# Patient Record
Sex: Male | Born: 1935
Health system: Southern US, Community
[De-identification: ages and names within clinical notes are randomized; demographics above are authoritative.]

## PROBLEM LIST (undated history)

## (undated) DIAGNOSIS — I10 Essential (primary) hypertension: Secondary | ICD-10-CM

## (undated) DIAGNOSIS — I2510 Atherosclerotic heart disease of native coronary artery without angina pectoris: Secondary | ICD-10-CM

## (undated) DIAGNOSIS — E785 Hyperlipidemia, unspecified: Secondary | ICD-10-CM

## (undated) DIAGNOSIS — N400 Enlarged prostate without lower urinary tract symptoms: Secondary | ICD-10-CM

## (undated) DIAGNOSIS — J8410 Pulmonary fibrosis, unspecified: Secondary | ICD-10-CM

## (undated) DIAGNOSIS — R0602 Shortness of breath: Secondary | ICD-10-CM

## (undated) DIAGNOSIS — I998 Other disorder of circulatory system: Secondary | ICD-10-CM

## (undated) DIAGNOSIS — L989 Disorder of the skin and subcutaneous tissue, unspecified: Secondary | ICD-10-CM

## (undated) DIAGNOSIS — M1990 Unspecified osteoarthritis, unspecified site: Secondary | ICD-10-CM

## (undated) DIAGNOSIS — K219 Gastro-esophageal reflux disease without esophagitis: Secondary | ICD-10-CM

## (undated) HISTORY — PX: TONSILLECTOMY: SUR1361

## (undated) HISTORY — DX: Essential (primary) hypertension: I10

## (undated) HISTORY — DX: Hyperlipidemia, unspecified: E78.5

## (undated) HISTORY — PX: KNEE ARTHROSCOPY: SUR90

## (undated) HISTORY — DX: Gastro-esophageal reflux disease without esophagitis: K21.9

## (undated) HISTORY — PX: EYE SURGERY: SHX253

## (undated) HISTORY — PX: INGUINAL HERNIA REPAIR: SUR1180

## (undated) HISTORY — DX: Shortness of breath: R06.02

## (undated) HISTORY — DX: Atherosclerotic heart disease of native coronary artery without angina pectoris: I25.10

## (undated) HISTORY — DX: Unspecified osteoarthritis, unspecified site: M19.90

## (undated) HISTORY — DX: Benign prostatic hyperplasia without lower urinary tract symptoms: N40.0

## (undated) HISTORY — DX: Pulmonary fibrosis, unspecified: J84.10

## (undated) HISTORY — PX: JOINT REPLACEMENT: SHX530

## (undated) HISTORY — DX: Other disorder of circulatory system: I99.8

---
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
## 1997-12-28 ENCOUNTER — Ambulatory Visit (HOSPITAL_COMMUNITY): Admission: RE | Admit: 1997-12-28 | Discharge: 1997-12-28 | Payer: Self-pay | Admitting: Gastroenterology

## 2001-03-31 ENCOUNTER — Encounter (INDEPENDENT_AMBULATORY_CARE_PROVIDER_SITE_OTHER): Payer: Self-pay

## 2001-03-31 ENCOUNTER — Ambulatory Visit (HOSPITAL_COMMUNITY): Admission: RE | Admit: 2001-03-31 | Discharge: 2001-03-31 | Payer: Self-pay | Admitting: Gastroenterology

## 2001-07-25 ENCOUNTER — Encounter: Payer: Self-pay | Admitting: Occupational Medicine

## 2001-07-25 ENCOUNTER — Encounter: Admission: RE | Admit: 2001-07-25 | Discharge: 2001-07-25 | Payer: Self-pay | Admitting: Occupational Medicine

## 2004-12-18 ENCOUNTER — Encounter: Admission: RE | Admit: 2004-12-18 | Discharge: 2004-12-18 | Payer: Self-pay | Admitting: Orthopedic Surgery

## 2007-06-02 ENCOUNTER — Inpatient Hospital Stay (HOSPITAL_BASED_OUTPATIENT_CLINIC_OR_DEPARTMENT_OTHER): Admission: RE | Admit: 2007-06-02 | Discharge: 2007-06-02 | Payer: Self-pay | Admitting: Cardiovascular Disease

## 2007-06-09 ENCOUNTER — Inpatient Hospital Stay (HOSPITAL_COMMUNITY): Admission: AD | Admit: 2007-06-09 | Discharge: 2007-06-10 | Payer: Self-pay | Admitting: Cardiovascular Disease

## 2007-06-09 HISTORY — PX: CORONARY ANGIOPLASTY WITH STENT PLACEMENT: SHX49

## 2007-06-14 ENCOUNTER — Emergency Department (HOSPITAL_COMMUNITY): Admission: EM | Admit: 2007-06-14 | Discharge: 2007-06-14 | Payer: Self-pay | Admitting: Emergency Medicine

## 2007-08-21 ENCOUNTER — Encounter (HOSPITAL_COMMUNITY): Admission: RE | Admit: 2007-08-21 | Discharge: 2007-11-19 | Payer: Self-pay | Admitting: Cardiovascular Disease

## 2007-11-20 ENCOUNTER — Encounter (HOSPITAL_COMMUNITY): Admission: RE | Admit: 2007-11-20 | Discharge: 2007-11-21 | Payer: Self-pay | Admitting: Cardiovascular Disease

## 2009-08-09 ENCOUNTER — Inpatient Hospital Stay (HOSPITAL_COMMUNITY): Admission: EM | Admit: 2009-08-09 | Discharge: 2009-08-10 | Payer: Self-pay | Admitting: Emergency Medicine

## 2010-05-11 ENCOUNTER — Ambulatory Visit: Payer: Self-pay | Admitting: Cardiovascular Disease

## 2010-09-28 LAB — URINALYSIS, ROUTINE W REFLEX MICROSCOPIC
Bilirubin Urine: NEGATIVE
Glucose, UA: NEGATIVE mg/dL
Ketones, ur: NEGATIVE mg/dL
Leukocytes, UA: NEGATIVE
Nitrite: NEGATIVE
Protein, ur: 30 mg/dL — AB
Specific Gravity, Urine: 1.02 (ref 1.005–1.030)
Urobilinogen, UA: 0.2 mg/dL (ref 0.0–1.0)
pH: 5.5 (ref 5.0–8.0)

## 2010-09-28 LAB — COMPREHENSIVE METABOLIC PANEL
ALT: 26 U/L (ref 0–53)
Albumin: 3.9 g/dL (ref 3.5–5.2)
Alkaline Phosphatase: 61 U/L (ref 39–117)
BUN: 18 mg/dL (ref 6–23)
BUN: 24 mg/dL — ABNORMAL HIGH (ref 6–23)
CO2: 23 mEq/L (ref 19–32)
Calcium: 8.4 mg/dL (ref 8.4–10.5)
Calcium: 8.9 mg/dL (ref 8.4–10.5)
Chloride: 109 mEq/L (ref 96–112)
Creatinine, Ser: 1.39 mg/dL (ref 0.4–1.5)
Creatinine, Ser: 1.4 mg/dL (ref 0.4–1.5)
GFR calc non Af Amer: 49 mL/min — ABNORMAL LOW (ref >60)
GFR calc non Af Amer: 49 mL/min — ABNORMAL LOW (ref >60)
Glucose, Bld: 100 mg/dL — ABNORMAL HIGH (ref 70–99)
Total Bilirubin: 0.7 mg/dL (ref 0.3–1.2)

## 2010-09-28 LAB — COMPREHENSIVE METABOLIC PANEL WITH GFR
AST: 56 U/L — ABNORMAL HIGH (ref 0–37)
CO2: 22 meq/L (ref 19–32)
Chloride: 112 meq/L (ref 96–112)
GFR calc Af Amer: 59 mL/min — ABNORMAL LOW (ref >60)
Glucose, Bld: 144 mg/dL — ABNORMAL HIGH (ref 70–99)
Potassium: 5.6 meq/L — ABNORMAL HIGH (ref 3.5–5.1)
Sodium: 138 meq/L (ref 135–145)
Total Bilirubin: 1.7 mg/dL — ABNORMAL HIGH (ref 0.3–1.2)
Total Protein: 7.1 g/dL (ref 6.0–8.3)

## 2010-09-28 LAB — CARDIAC PANEL(CRET KIN+CKTOT+MB+TROPI)
CK, MB: 4.9 ng/mL — ABNORMAL HIGH (ref 0.3–4.0)
Relative Index: 0.9 (ref 0.0–2.5)

## 2010-09-28 LAB — LIPASE, BLOOD: Lipase: 32 U/L (ref 11–59)

## 2010-09-28 LAB — DIFFERENTIAL
Basophils Absolute: 0 10*3/uL (ref 0.0–0.1)
Basophils Relative: 0 % (ref 0–1)
Eosinophils Absolute: 0 10*3/uL (ref 0.0–0.7)
Eosinophils Relative: 0 % (ref 0–5)
Lymphocytes Relative: 2 % — ABNORMAL LOW (ref 12–46)
Lymphs Abs: 0.2 K/uL — ABNORMAL LOW (ref 0.7–4.0)
Monocytes Absolute: 0.5 10*3/uL (ref 0.1–1.0)
Monocytes Relative: 6 % (ref 3–12)
Neutro Abs: 7.5 K/uL (ref 1.7–7.7)
Neutrophils Relative %: 92 % — ABNORMAL HIGH (ref 43–77)

## 2010-09-28 LAB — CULTURE, BLOOD (ROUTINE X 2)
Culture: NO GROWTH
Culture: NO GROWTH

## 2010-09-28 LAB — D-DIMER, QUANTITATIVE: D-Dimer, Quant: 2.34 ug{FEU}/mL — ABNORMAL HIGH (ref 0.00–0.48)

## 2010-09-28 LAB — CBC
HCT: 40.1 % (ref 39.0–52.0)
HCT: 44.3 % (ref 39.0–52.0)
Hemoglobin: 13.6 g/dL (ref 13.0–17.0)
Hemoglobin: 15.1 g/dL (ref 13.0–17.0)
MCHC: 34 g/dL (ref 30.0–36.0)
MCV: 86.9 fL (ref 78.0–100.0)
Platelets: 121 10*3/uL — ABNORMAL LOW (ref 150–400)
Platelets: 88 10*3/uL — ABNORMAL LOW (ref 150–400)
RBC: 4.61 MIL/uL (ref 4.22–5.81)
RBC: 5.1 MIL/uL (ref 4.22–5.81)
RDW: 13.5 % (ref 11.5–15.5)
WBC: 8.2 K/uL (ref 4.0–10.5)

## 2010-09-28 LAB — URINE MICROSCOPIC-ADD ON

## 2010-09-28 LAB — POCT CARDIAC MARKERS
CKMB, poc: 4.3 ng/mL (ref 1.0–8.0)
Myoglobin, poc: 428 ng/mL (ref 12–200)
Troponin i, poc: <0.05 ng/mL (ref 0.00–0.09)

## 2010-09-28 LAB — HEMOCCULT GUIAC POC 1CARD (OFFICE): Fecal Occult Bld: NEGATIVE

## 2010-11-21 NOTE — Cardiovascular Report (Signed)
[name], AGE           ACCOUNT NO.:  [id number]   MEDICAL RECORD NO.:  [id number]          PATIENT TYPE:  OIB   LOCATION:  2807                         FACILITY:  [facility]   PHYSICIAN:  Vesta Mixer, M.D. DATE OF BIRTH:  [birth year]   DATE OF PROCEDURE:  06/09/2007  DATE OF DISCHARGE:                            CARDIAC CATHETERIZATION   INDICATIONS:  Patrick Knapp is a 77 year old gentleman with a recent  development of shortness of breath with exertion.  He had a stress  Cardiolite study which revealed lateral ischemia.  He had a heart  catheterization which revealed a very tight stenosis in the first obtuse  marginal artery.  He had moderate disease in the left anterior  descending artery but not look bad enough to warrant angioplasty.  He  was referred for PTCA and stenting of the first obtuse marginal artery.   PROCEDURE:  The procedure was left heart catheterization with coronary  angiography with PTCA and stenting of the first obtuse marginal.   PROCEDURE IN DETAIL:  The right femoral artery was easily cannulated  using modified Seldinger technique.  We placed a 6-French sheath.  The  patient was given Angiomax.  The ACT was 397.  The BMW wire was  positioned up to the circumflex and placed down the distal obtuse  marginal artery number one.  A 2.0 x 12-mm Maverick was positioned  across the stenosis and was inflated up to 6 atmospheres for 41 seconds.   Following this, a 2.5 x 60-mm Taxus stent was positioned up in the  obtuse marginal artery.  We used a Advertising account planner as a buddy wire to help  position.  This was mostly required because of the tortuosity of the  first obtuse marginal artery.  The stent was deployed at 14 atmospheres  for 47 seconds.  We then inflated the stent one additional time up to 8  atmospheres to make sure that it had released from the stent.   This balloon was removed and post stent dilatation was achieved using a  2.5 x 15-mm Quantum  Maverick.  It was centered and the middle of the  stent was inflated up to 14 atmospheres for 33 seconds.  This resulted  in a very nice angiographic result.  There was a slight step up on the  proximal edge of the stent, but it certainly did not look like an edge  dissection.  There were no complications.   COMPLICATIONS:  None.   CONCLUSIONS:  Successful PTCA and stenting of the mid first obtuse  marginal artery.  The patient still has moderate stenosis of the LAD.  We will continue to treat that medically.  If its gets much more severe,  then we will consider a PTCA and stenting of his proximal/mid LAD.           ______________________________  Vesta Mixer, M.D.     PJN/MEDQ  D:  06/09/2007  T:  06/09/2007  Job:  [id number]   cc:   Larina Earthly, M.D.

## 2010-11-21 NOTE — H&P (Signed)
NAMEJONATHYN, Patrick Knapp           ACCOUNT NO.:  [id number]   MEDICAL RECORD NO.:  [id number]           PATIENT TYPE:   LOCATION:                                 FACILITY:   PHYSICIAN:  Vesta Mixer, M.D. DATE OF BIRTH:  Apr 15, 1928   DATE OF ADMISSION:  05/30/2007  DATE OF DISCHARGE:                              HISTORY & PHYSICAL   HISTORY OF PRESENT ILLNESS:  Patrick Knapp is a 77 year old  gentleman with a history of shortness of breath with exertion.  He is  admitted for heart catheterization after having an abnormal stress test.   Patrick Knapp is a relatively healthy gentleman.  He has a history of an  enlarged prostate.  He also has a history of a spinal cyst and some eye  surgery years ago.  He recently has started having some episodes of  shortness of breath with any sort of exertion.  He noted that he had  shortness breath with raking leaves or doing other yard work.  He has  also noted shortness of breath when he climbs stairs.  He has had some  trouble sleeping, but denies any sort of orthopnea.   He was recently referred for a stress Cardiolite study.  He was found to  have lateral wall ischemia with overall fairly well preserved left  ventricular systolic function.  He is now referred for heart  catheterization.   CURRENT MEDICATIONS:  1. Doxazosin 8 mg a day.  2. Aspirin 81 mg two times a day.  3. Multivitamin once a day.   ALLERGIES:  None.   PAST MEDICAL HISTORY:  1. History of prostatic enlargement.  2. History of spinal cyst  3. 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  He does not smoke or drink alcohol.   FAMILY HISTORY:  Father died at age 71 due to lung cancer.  There was a  question of coronary artery disease in his father.  His mother died at  age 57 due to problems related to congestive heart failure and TIA.  He  has a brother who had coronary artery  bypass grafting.   REVIEW OF SYSTEMS:  Review is essentially negative except as noted in  the HPI.   PHYSICAL EXAMINATION:  GENERAL:  He is an elderly gentleman in no acute  distress.  He is alert and oriented x3 and his mood and affect are  normal.  VITAL SIGNS:  Weight is 208, blood pressure 130/76 with heart rate of  72.  HEENT:  Exam reveals 2+ carotids, no bruits, no JVD, no thyromegaly.  LUNGS: Clear to auscultation.  Heart: Regular rate, S1-S2.  ABDOMEN:  Reveals good bowel sounds and is nontender.  EXTREMITIES:  Has no clubbing, cyanosis or edema.  NEUROLOGICAL:  Exam is nonfocal   STUDIES:  Stress Cardiolite study reveals evidence of ischemia in the  lateral wall.  He has well preserved left ventricular systolic function  with an ejection fraction of 64%.  His overall contractility is normal.  His lab work reveals a glucose level of 88.  His sodium is 141,  potassium is 4.2.  His white blood cell count is 6.5, hemoglobin is  13.2, hematocrit 40.4.  His protime and PTT are pending.   Patrick Knapp presents with recent episodes of dyspnea on exertion.  He  has an abnormal stress Cardiolite study suggestive of lateral ischemia.  I have had a long discussion with Patrick Knapp and his wife.  We have  discussed the risks, benefits and options concerning heart  catheterization.  He understands and agrees to proceed.  We will  schedule this for Monday.  All of his other medical problems remain  fairly stable.           ______________________________  Vesta Mixer, M.D.     PJN/MEDQ  D:  05/30/2007  T:  05/31/2007  Job:  [id number]   cc:   Larina Earthly, M.D.

## 2010-11-21 NOTE — Cardiovascular Report (Signed)
Patrick Knapp, GARRELS NO.:  [id number]   MEDICAL RECORD NO.:  [id number]          PATIENT TYPE:  OIB   LOCATION:  1962                         FACILITY:  [facility]   PHYSICIAN:  Vesta Mixer, M.D. DATE OF BIRTH:  23-Jun-1928   DATE OF PROCEDURE:  06/02/2007  DATE OF DISCHARGE:                            CARDIAC CATHETERIZATION   Sosaia is a 77 year old gentleman with a recent onset of shortness of  breath with exertion.  He recently had a stress Cardiolite study which  revealed evidence of lateral ischemia. He is referred for heart  catheterization for further evaluation.   PROCEDURE:  Left heart catheterization with coronary angiography.   The right femoral artery was easily cannulated using the modified  Seldinger technique.   HEMODYNAMIC RESULTS:  LV pressure is 129/8 with an aortic pressure of  133/63.   Angiography left main.  The left main is fairly smooth and normal.   We used a Judkins left 5 catheter to cannulate the left main.   The left anterior descending artery has proximal irregularities.  The  mid segment has a long 30-40% diffuse stenosis.  The distal LAD has  diffuse irregularities but no critical stenosis.   The first diagonal artery is a small to moderate size vessel.  It is  perhaps 1.5 mm in diameter.  There is a 50-60% stenosis in the proximal  aspect followed by a sequential 50-60% stenosis in the mid aspect.  The  second diagonal artery is a very tiny vessel with a 50% stenosis at its  origin.   The left circumflex artery is a moderate-sized vessel.  It gives off a  moderate-sized first obtuse marginal artery.  There is a focal 95%  stenosis in the first obtuse marginal artery.  The distal circumflex  artery is a relatively small branch and is otherwise unremarkable.   The right coronary artery is a moderate-sized vessel and is dominant.  There are minor luminal irregularities. The posterior descending artery  has minor luminal  irregularities.  The posterior lateral segment artery  are unremarkable.   The left ventriculogram was performed in a 30 RAO position.  It reveals  overall normal left ventricular systolic function.  The ejection  fraction is around 60%.   COMPLICATIONS:  None.   CONCLUSION:  Moderate coronary artery disease involving the left  anterior descending artery with a tight stenosis in the obtuse marginal  artery.  At this point, I do not think that the LAD stenosis is  obstructive.  We will anticipate PTCA and stenting of the obtuse  marginal stenosis.  We will go ahead and start loading him on Plavix  today. His left ventricular systolic function is normal.           ______________________________  Vesta Mixer, M.D.     PJN/MEDQ  D:  06/02/2007  T:  06/02/2007  Job:  [id number]   cc:   Larina Earthly, M.D.

## 2010-11-21 NOTE — Discharge Summary (Signed)
[name], [name]           ACCOUNT NO.:  [id number]   MEDICAL RECORD NO.:  [id number]          PATIENT TYPE:  INP   LOCATION:  6522                         FACILITY:  [facility]   PHYSICIAN:  Vesta Mixer, M.D. DATE OF BIRTH:  Oct 03, 1927   DATE OF ADMISSION:  06/09/2007  DATE OF DISCHARGE:  06/10/2007                               DISCHARGE SUMMARY   DISCHARGE DIAGNOSES:  1. Coronary artery disease - status post PTCA and stenting of his left      circumflex first marginal vessel.  2. History of benign prostatic hypertrophy.   DISCHARGE MEDICATIONS:  1. Aspirin 81 mg a day.  2. Plavix 75 mg a day.  3. Nitroglycerin 0.4 mg sublingually as needed.  4. Doxazosin 8 mg at night.  5. Crestor 10 mg at night.   DISPOSITION:  The patient will return to see Dr. Elease Hashimoto in 1-2 weeks.  Will get a fasting lipid profile with Lipomed profile.  We will also get  a high sensitivity CRP.  The patient will call sooner if he has any  problems.   HISTORY OF PRESENT ILLNESS:  Mr. Patrick Knapp is a 77 year old gentleman  with a recent diagnosis of coronary artery disease.  Please see dictated  H&P for further details.   HOSPITAL COURSE PER PROBLEMS:  Coronary artery disease.  The patient had  a diagnostic catheterization last week which revealed a tight first  obtuse marginal artery.  He underwent successful PTCA and stenting of  the first obtuse marginal vessel using a 2.25 x 16 mm Taxus stent.  It  was deployed at 14 atmospheres.  Was postdilated using a 2.5 x 15 mm  Quantum Maverick inflated up to 14 atmospheres.  We obtained a very nice  result with 0% residual stenosis.   He does have a 40-50% mid LAD stenosis which we will treat medically.  He does not have any ischemia in this area by Cardiolite scanning last  week.  We will try him on high dose lipid lowering therapy.  We will get  a Lipomed profile to evaluate his Lp(a).  Will also get a high  sensitivity CRP.  He will likely need  aggressive cholesterol lowering.  All his other medical problems remained stable.           ______________________________  Vesta Mixer, M.D.     PJN/MEDQ  D:  06/10/2007  T:  06/10/2007  Job:  [id number]   cc:   Larina Earthly, M.D.

## 2010-11-24 NOTE — Procedures (Signed)
[facility]  Patient:    Patrick Knapp, Patrick Knapp Visit Number: [id number] MRN: [id number]          Service Type: END Location: ENDO Attending Physician:  Louie Bun Dictated by:   Everardo All Madilyn Fireman, M.D. Admit Date:  03/31/2001   CC:         Ravi R. Felipa Eth, M.D.   Procedure Report  PROCEDURE:  Colonoscopy and polypectomy.  ENDOSCOPIST:  Everardo All. Madilyn Fireman, M.D.  INDICATIONS FOR PROCEDURE:  Adenomatous colon polyps on initial colonoscopy three years ago.  PROCEDURE:  The patient was placed in the left lateral decubitus position and placed on the pulse monitor with continuous low flow oxygen delivered via nasal cannula.  She was sedated with 70 mg of IV Demerol and 6 mg of IV Versed.  The Olympus video colonoscope was inserted into the rectum and advanced to the cecum and confirmed by transillumination at McBurneys point and visualization of the ileocecal valve and appendiceal orifice.  The cecum and ascending colon appeared normal.  There was an 8 mm polyp in the transverse colon which was fulgurated by hot biopsy.  The remainder of the transverse colon appeared normal.  The descending and sigmoid colon appeared normal.  Within the rectum, there was an 8 mm polyp that was fulgurated by hot biopsy and sent in the same jar as the other polyp.  The colonoscope was withdrawn and the patient returned to the recovery room in stable condition. She tolerated the procedure well and there were no immediate complications.  IMPRESSION:  Two small colon polyps, otherwise, normal colonoscopy.  PLAN:  Await biopsy results to determine interval for next colonoscopy. Dictated by:   Everardo All Madilyn Fireman, M.D. Attending Physician:  Louie Bun DD:  03/31/01 TD:  03/31/01 Job: [id number]

## 2010-12-08 ENCOUNTER — Ambulatory Visit: Payer: Self-pay | Admitting: Cardiovascular Disease

## 2010-12-08 ENCOUNTER — Other Ambulatory Visit: Payer: Self-pay | Admitting: *Deleted

## 2010-12-29 ENCOUNTER — Encounter: Payer: Self-pay | Admitting: *Deleted

## 2011-01-04 ENCOUNTER — Other Ambulatory Visit: Payer: Self-pay | Admitting: *Deleted

## 2011-01-04 DIAGNOSIS — E785 Hyperlipidemia, unspecified: Secondary | ICD-10-CM

## 2011-01-05 ENCOUNTER — Ambulatory Visit (INDEPENDENT_AMBULATORY_CARE_PROVIDER_SITE_OTHER): Payer: Medicare Other | Admitting: Cardiovascular Disease

## 2011-01-05 ENCOUNTER — Encounter: Payer: Self-pay | Admitting: Cardiovascular Disease

## 2011-01-05 ENCOUNTER — Other Ambulatory Visit (INDEPENDENT_AMBULATORY_CARE_PROVIDER_SITE_OTHER): Payer: Medicare Other | Admitting: *Deleted

## 2011-01-05 VITALS — BP 124/60 | HR 70 | Ht 68.0 in | Wt 191.8 lb

## 2011-01-05 DIAGNOSIS — I10 Essential (primary) hypertension: Secondary | ICD-10-CM | POA: Insufficient documentation

## 2011-01-05 DIAGNOSIS — E785 Hyperlipidemia, unspecified: Secondary | ICD-10-CM

## 2011-01-05 DIAGNOSIS — I2510 Atherosclerotic heart disease of native coronary artery without angina pectoris: Secondary | ICD-10-CM

## 2011-01-05 LAB — LIPID PANEL
HDL: 44.5 mg/dL (ref >39.00)
Total CHOL/HDL Ratio: 3
Triglycerides: 124 mg/dL (ref 0.0–149.0)

## 2011-01-05 LAB — HEPATIC FUNCTION PANEL
Albumin: 4.1 g/dL (ref 3.5–5.2)
Bilirubin, Direct: 0.2 mg/dL (ref 0.0–0.3)
Total Protein: 7.1 g/dL (ref 6.0–8.3)

## 2011-01-05 LAB — BASIC METABOLIC PANEL
CO2: 28 mEq/L (ref 19–32)
Calcium: 9.5 mg/dL (ref 8.4–10.5)
Creatinine, Ser: 1.3 mg/dL (ref 0.4–1.5)

## 2011-01-05 NOTE — Progress Notes (Signed)
Patrick Knapp Date of Birth  09-Nov-1927 [facility] [address]     [address] [place], [place]  [address] [phone]  Fax  [phone]  History of Present Illness:  recent episode of syncope.  No angina.  Felt better almost immediately.  He's not had any further episodes of syncope. He thinks that maybe he was up just a little dehydrated.  Current Outpatient Prescriptions on File Prior to Visit  Medication Sig Dispense Refill  . clopidogrel (PLAVIX) 75 MG tablet Take 75 mg by mouth daily.        . Doxylamine Succinate, Sleep, (SLEEP AID PO) Take 1 tablet by mouth at bedtime as needed.        . finasteride (PROPECIA) 1 MG tablet Take 1 mg by mouth daily.       . meloxicam (MOBIC) 7.5 MG tablet Take 7.5 mg by mouth as needed.        . metoprolol tartrate (LOPRESSOR) 25 MG tablet Take 25 mg by mouth daily.       . NON FORMULARY Doxazine -- 8 mg take 1 tablet by mouth daily       . omeprazole (PRILOSEC) 20 MG capsule Take 20 mg by mouth daily.        . rosuvastatin (CRESTOR) 20 MG tablet Take 20 mg by mouth daily.        Marland Kitchen DISCONTD: multivitamin (THERAGRAN) per tablet Take 1 tablet by mouth daily.          Allergies  Allergen Reactions  . Iodinated Diagnostic Agents     Past Medical History  Diagnosis Date  . Coronary artery disease     post PTCA and stenting of his left circumflex artery in 12/08  . Hypertension   . Hyperlipidemia   . Ischemia   . Shortness of breath   . Enlarged prostate     Past Surgical History  Procedure Date  . Cardiac catheterization 06/2007    Est. EF of 55-60%PTCA and stenting of his left circumflex artery in 12/08    History  Smoking status  . Never Smoker   Smokeless tobacco  . Not on file    History  Alcohol Use No    Family History  Problem Relation Age of Onset  . Heart failure Mother     possible  . Coronary artery disease Brother     Reviw of Systems:  Reviewed in the HPI.   All other systems are negative.  Physical Exam: BP 124/60  Pulse 70  Ht 5\' 8"  (1.727 m)  Wt 191 lb 12.8 oz (87 kg)  BMI 29.16 kg/m2 The patient is alert and oriented x 3.  The mood and affect are normal.   Skin: warm and dry.  Color is normal.    HEENT:   the sclera are nonicteric.  The mucous membranes are moist.  The carotids are 2+ without bruits.  There is no thyromegaly.  There is no JVD.    Lungs: clear.  The chest wall is non tender.    Heart: regular rate with a normal S1 and S2.  There are no murmurs, gallops, or rubs. The PMI is not displaced.     Abdomin: good bowel sounds.  There is no guarding or rebound.  There is no hepatosplenomegaly or tenderness.  There are no masses.   Extremities:  no clubbing, cyanosis, or edema.  The legs are without rashes.  The distal pulses are intact.  Neuro:  Cranial nerves II - XII are intact.  Motor and sensory functions are intact.    The gait is normal.  Assessment / Plan:

## 2011-01-05 NOTE — Assessment & Plan Note (Signed)
Patrick Knapp seems to be doing fairly well. I'm pleased that he is not having any episodes of angina. He had one episode of orthostatic hypotension which resulted in a brief episode of syncope but I do not think that it is anything serious. I've asked him to drink more fluids.

## 2011-01-05 NOTE — Assessment & Plan Note (Signed)
His blood pressure is fairly well-controlled. He had one episode of orthostatic hypotension. We will continue to follow him closely. I'll see him in 6 months.

## 2011-01-08 NOTE — Progress Notes (Signed)
Patient called with lab results. Pt verbalized understanding. Jodette Demiya Magno RN  

## 2011-02-19 ENCOUNTER — Other Ambulatory Visit: Payer: Self-pay | Admitting: *Deleted

## 2011-02-19 MED ORDER — CLOPIDOGREL BISULFATE 75 MG PO TABS: 75.0000 mg | ORAL_TABLET | Freq: Every day | ORAL | Status: DC

## 2011-02-19 MED ORDER — METOPROLOL TARTRATE 25 MG PO TABS: 25.0000 mg | ORAL_TABLET | Freq: Every day | ORAL | Status: DC

## 2011-02-19 NOTE — Telephone Encounter (Signed)
Per request of wife

## 2011-03-13 ENCOUNTER — Telehealth: Payer: Self-pay | Admitting: Cardiovascular Disease

## 2011-03-13 MED ORDER — METOPROLOL TARTRATE 25 MG PO TABS: 25.0000 mg | ORAL_TABLET | Freq: Two times a day (BID) | ORAL | Status: DC

## 2011-03-13 NOTE — Telephone Encounter (Signed)
Had some confusion about his metoprolol today when he went to the [place] and needed some clarification. Please call back. I have pulled his chart.

## 2011-03-13 NOTE — Telephone Encounter (Signed)
Metoprolol is tartrate and was reordered to bid as pt was taking prior,Pt verbalized understanding. Alfonso Ramus RN

## 2011-04-13 ENCOUNTER — Telehealth: Payer: Self-pay | Admitting: Cardiovascular Disease

## 2011-04-13 NOTE — Telephone Encounter (Signed)
Refill needed simvastatin 80 mg, uses [facility] 90 days

## 2011-04-16 LAB — BASIC METABOLIC PANEL
BUN: 17
CO2: 21
CO2: 23
Calcium: 8.6
Calcium: 9.1
Chloride: 109
Creatinine, Ser: 1.07
GFR calc Af Amer: >60
GFR calc Af Amer: >60
GFR calc non Af Amer: 59 — ABNORMAL LOW
Glucose, Bld: 121 — ABNORMAL HIGH
Sodium: 139

## 2011-04-16 LAB — PROTIME-INR: INR: 1.1

## 2011-04-16 LAB — I-STAT 8, (EC8 V) (CONVERTED LAB)
Acid-base deficit: 2
Bicarbonate: 23.3
Glucose, Bld: 97
TCO2: 24
pCO2, Ven: 39.4 — ABNORMAL LOW
pH, Ven: 7.38 — ABNORMAL HIGH

## 2011-04-16 LAB — POCT CARDIAC MARKERS
CKMB, poc: 1.2
Myoglobin, poc: 109
Myoglobin, poc: 85.5
Operator id: 284251
Operator id: 284251

## 2011-04-16 LAB — POCT I-STAT CREATININE
Creatinine, Ser: 1.3
Operator id: 284251

## 2011-04-16 LAB — CBC
Hemoglobin: 15
MCHC: 33.4
MCV: 86.6
Platelets: 177
RBC: 4.8
RBC: 5.18
RDW: 13.6

## 2011-04-16 LAB — CK TOTAL AND CKMB (NOT AT ARMC)
Relative Index: INVALID
Total CK: 45

## 2011-04-16 LAB — APTT: aPTT: 30

## 2011-05-01 ENCOUNTER — Other Ambulatory Visit: Payer: Self-pay | Admitting: *Deleted

## 2011-05-01 MED ORDER — SIMVASTATIN 80 MG PO TABS: 40.0000 mg | ORAL_TABLET | Freq: Every day | ORAL | Status: DC

## 2011-05-01 NOTE — Telephone Encounter (Signed)
Pt walked in for refill of medication simvastatin 80 mg, said he requested it 2 weeks ago and it wasn't taken care of and wants samples. After several phone calls and having pt go home and I would call with answers, I see pt is seen at [facility] and meds have been filled and changed by them, our records show he was on crestor 20 mg on last visit but pt said i have been on simvastatin to his knowledge for years. Reviewed labs, new ov f/u, called [facility] but no med filled prior. Pt found bottle and read directions to me, refill completed, pt aware he has app next month and was told to be fasting so we could check cholesterol.Pt verbalized understanding. Alfonso Ramus RN

## 2011-05-17 ENCOUNTER — Telehealth: Payer: Self-pay | Admitting: Cardiovascular Disease

## 2011-05-17 NOTE — Telephone Encounter (Signed)
Ref Number [id number] [facility] wants to verify dosage of zocor

## 2011-05-17 NOTE — Telephone Encounter (Signed)
Verified strength and dose with [facility]

## 2011-06-01 ENCOUNTER — Other Ambulatory Visit: Payer: Self-pay | Admitting: *Deleted

## 2011-06-01 MED ORDER — CLOPIDOGREL BISULFATE 75 MG PO TABS: 75.0000 mg | ORAL_TABLET | Freq: Every day | ORAL | Status: DC

## 2011-07-05 ENCOUNTER — Encounter: Payer: Self-pay | Admitting: Cardiovascular Disease

## 2011-07-05 ENCOUNTER — Ambulatory Visit (INDEPENDENT_AMBULATORY_CARE_PROVIDER_SITE_OTHER): Payer: Medicare Other | Admitting: Cardiovascular Disease

## 2011-07-05 VITALS — BP 125/67 | HR 60 | Ht 66.0 in | Wt 198.8 lb

## 2011-07-05 DIAGNOSIS — I2510 Atherosclerotic heart disease of native coronary artery without angina pectoris: Secondary | ICD-10-CM

## 2011-07-05 DIAGNOSIS — E785 Hyperlipidemia, unspecified: Secondary | ICD-10-CM

## 2011-07-05 DIAGNOSIS — I10 Essential (primary) hypertension: Secondary | ICD-10-CM

## 2011-07-05 MED ORDER — ATORVASTATIN CALCIUM 20 MG PO TABS: 20.0000 mg | ORAL_TABLET | Freq: Every day | ORAL | Status: DC

## 2011-07-05 NOTE — Progress Notes (Signed)
    Patrick Knapp Date of Birth  08/31/1927  [facility] [address]    [address] [place], [place]  [address] [phone]  Fax  [phone]  [facility] [address] [place], [place]  [address] [phone]   Fax [phone]  History of Present Illness:  Patrick Knapp is an 77 yo with a hx of CAD, HTN, hyperlipidemia, and BPH.  He is not had any episodes of chest pain or shortness breath. He's exploring some alternate therapies for his BPH including cryotherapy.  He works at [employer] for 4 hours every day. He does not get any other regular exercise.  Current Outpatient Prescriptions on File Prior to Visit  Medication Sig Dispense Refill  . Ascorbic Acid (VITAMIN C PO) Take by mouth as needed.       . clopidogrel (PLAVIX) 75 MG tablet Take 1 tablet (75 mg total) by mouth daily.  15 tablet  0  . Doxylamine Succinate, Sleep, (SLEEP AID PO) Take 1 tablet by mouth at bedtime as needed.        . finasteride (PROPECIA) 1 MG tablet Take 1 mg by mouth daily.       . fish oil-omega-3 fatty acids 1000 MG capsule Take 1 g by mouth daily.        . metoprolol tartrate (LOPRESSOR) 25 MG tablet Take 1 tablet (25 mg total) by mouth 2 (two) times daily.  180 tablet  3  . NON FORMULARY Doxazine -- 8 mg take 1 tablet by mouth daily       . simvastatin (ZOCOR) 80 MG tablet Take 0.5 tablets (40 mg total) by mouth at bedtime.  90 tablet  1    Allergies  Allergen Reactions  . Iodinated Diagnostic Agents     Past Medical History  Diagnosis Date  . Coronary artery disease     post PTCA and stenting of his left circumflex artery in 12/08  . Hypertension   . Hyperlipidemia   . Ischemia   . Shortness of breath   . Enlarged prostate     Past Surgical History  Procedure Date  . Cardiac catheterization 06/2007    Est. EF of 55-60%PTCA and stenting of his left circumflex artery in 12/08    History  Smoking status  . Never Smoker   Smokeless tobacco  . Not on file     History  Alcohol Use No    Family History  Problem Relation Age of Onset  . Heart failure Mother     possible  . Coronary artery disease Brother     Reviw of Systems:  Reviewed in the HPI.  All other systems are negative.  Physical Exam: BP 125/67  Pulse 60  Ht 5\' 6"  (1.676 m)  Wt 198 lb 12.8 oz (90.175 kg)  BMI 32.09 kg/m2 The patient is alert and oriented x 3.  The mood and affect are normal.   Skin: warm and dry.  Color is normal.    HEENT:   Normocephalic/atraumatic. He is normal carotids. There is no JVD.  Lungs: Lungs are clear to auscultation.   Heart: Regular rate S1-S2.    Abdomen: Normal bowel sounds. There is no hepatosplenomegaly  Extremities:  No clubbing cyanosis or edema.  Neuro:  Nonfocal. Exam is normal.    ECG: Normal sinus rhythm. He has an old Inferior wall myocardial infarction.  Assessment / Plan:

## 2011-07-05 NOTE — Patient Instructions (Signed)
Your physician wants you to follow-up in: 6 months You will receive a reminder letter in the mail two months in advance. If you don't receive a letter, please call our office to schedule the follow-up appointment.  Your physician has recommended you make the following change in your medication:   STOP simvastatin START atorvastatin 20 mg daily  Your physician recommends that you return for a FASTING lipid profile: 6 months

## 2011-07-05 NOTE — Assessment & Plan Note (Addendum)
He's doing very well. He's not having episodes of chest pain or shortness breath. We'll change his simvastatin to atorvastatin 20 mg a day.  We'll see him again in 6 months for office visit and fasting lab work.

## 2011-07-16 ENCOUNTER — Telehealth: Payer: Self-pay | Admitting: Cardiovascular Disease

## 2011-07-16 ENCOUNTER — Other Ambulatory Visit: Payer: Self-pay | Admitting: *Deleted

## 2011-07-16 MED ORDER — SIMVASTATIN 40 MG PO TABS: 40.0000 mg | ORAL_TABLET | Freq: Every evening | ORAL | Status: DC

## 2011-07-16 NOTE — Telephone Encounter (Signed)
Wife upset that meds were changed. Last visit reflects the change and it was discussed by dr and Theola Sequin, she said her husband gets confussed with his meds, asked her to send a family member with him on his visit, she has 90 day supply and will try to send it back, pt was breaking simvastatin 80 mg in half but was telling us he took a whole 80 mg. meds reviewed and new script sent in.

## 2011-07-16 NOTE — Telephone Encounter (Signed)
New Problem:    Patient's wife is calling because she is unsure, and upset, that her husband received a new more expensive prescription of atorvastatin (LIPITOR) 20 MG tablet instead of sticking with the cheaper simbastatin that had been working fine.  Please call back.

## 2011-10-15 ENCOUNTER — Encounter: Payer: Self-pay | Admitting: Cardiovascular Disease

## 2012-02-29 ENCOUNTER — Other Ambulatory Visit: Payer: Self-pay | Admitting: *Deleted

## 2012-02-29 MED ORDER — SIMVASTATIN 40 MG PO TABS: 40.0000 mg | ORAL_TABLET | Freq: Every evening | ORAL | Status: DC

## 2012-02-29 NOTE — Telephone Encounter (Signed)
Fax Received. Refill Completed. Patrick Knapp (R.M.A)   

## 2012-06-12 LAB — IFOBT (OCCULT BLOOD): IFOBT: NEGATIVE

## 2012-10-20 ENCOUNTER — Emergency Department (HOSPITAL_COMMUNITY)
Admission: EM | Admit: 2012-10-20 | Discharge: 2012-10-20 | Disposition: A | Discharge status: Home / Self Care | Payer: Medicare Other | Attending: Emergency Medicine | Admitting: Emergency Medicine

## 2012-10-20 ENCOUNTER — Emergency Department (HOSPITAL_COMMUNITY): Payer: Medicare Other

## 2012-10-20 ENCOUNTER — Encounter (HOSPITAL_COMMUNITY): Payer: Self-pay | Admitting: Emergency Medicine

## 2012-10-20 DIAGNOSIS — N400 Enlarged prostate without lower urinary tract symptoms: Secondary | ICD-10-CM | POA: Insufficient documentation

## 2012-10-20 DIAGNOSIS — R209 Unspecified disturbances of skin sensation: Secondary | ICD-10-CM | POA: Insufficient documentation

## 2012-10-20 DIAGNOSIS — Z79899 Other long term (current) drug therapy: Secondary | ICD-10-CM | POA: Insufficient documentation

## 2012-10-20 DIAGNOSIS — E785 Hyperlipidemia, unspecified: Secondary | ICD-10-CM | POA: Insufficient documentation

## 2012-10-20 DIAGNOSIS — I2510 Atherosclerotic heart disease of native coronary artery without angina pectoris: Secondary | ICD-10-CM | POA: Insufficient documentation

## 2012-10-20 DIAGNOSIS — I10 Essential (primary) hypertension: Secondary | ICD-10-CM | POA: Insufficient documentation

## 2012-10-20 DIAGNOSIS — Z9861 Coronary angioplasty status: Secondary | ICD-10-CM | POA: Insufficient documentation

## 2012-10-20 DIAGNOSIS — M542 Cervicalgia: Secondary | ICD-10-CM | POA: Insufficient documentation

## 2012-10-20 MED ORDER — OXYCODONE-ACETAMINOPHEN 5-325 MG PO TABS: 1.0000 | ORAL_TABLET | ORAL | Status: DC | PRN

## 2012-10-20 NOTE — ED Notes (Signed)
Pt c/o discomfort/stiffness to L side of neck onset 1600.

## 2012-10-20 NOTE — ED Notes (Signed)
Patient transported to CT 

## 2012-10-20 NOTE — ED Provider Notes (Signed)
Pt received from [facility], PA-C at shift change.  Pt presenting to the ED with left sided neck and posterior scalp discomfort and numbness.  No associated unilateral weakness, radiation into UE, or facial droop.   Pt afebrile, no rigidity of neck and full ROM maintained.  Pain is low grade but has persisted several hours.  Prior episodes of torticollis.  Currently followed by Dr. Felipa Eth.  Plan:  If CT negative, will d/c and FU with PCP.   Ct Cervical Spine Wo Contrast  10/20/2012  *RADIOLOGY REPORT*  Clinical Data: Pain and numbness at the left posterior head and neck.  CT CERVICAL SPINE WITHOUT CONTRAST  Technique:  Multidetector CT imaging of the cervical spine was performed. Multiplanar CT image reconstructions were also generated.  Comparison: CT of the cervical spine performed 08/09/2009  Findings: There is no evidence of fracture or subluxation. Vertebral bodies demonstrate normal height and alignment.  There is mild narrowing of the intervertebral disc spaces at C5-C6 and C6- C7, with associated small anterior and posterior disc osteophyte complexes.  Prevertebral soft tissues are within normal limits.  The visualized sternocleidomastoid musculature is grossly symmetric in appearance.  Mild calcification is noted at the carotid bifurcations, more prominent on the right.  A small focus of calcification is noted at the right thyroid lobe, without evidence of a dominant mass. Minimal scarring is noted at the lung apices.  Mild apparent degenerative change is noted at the temporomandibular joints bilaterally.  IMPRESSION:  1.  No evidence of fracture or subluxation along the cervical spine. 2.  Mild degenerative change at the lower cervical spine. 3.  Mild calcification at the carotid bifurcations, more prominent on the right. 4.  Minimal scarring at the lung apices. 5.  Mild apparent degenerative change at the temporomandibular joints bilaterally. 6.  Visualized musculature appears grossly symmetric; no  definite soft tissue findings seen to explain the patient's left-sided pain and numbness.   Original Report Authenticated By: Tonia Ghent, M.D.       CT CS negative for acute process or muscular abnormalities.  Discussed results with patient.   FU with PCP- Dr. Felipa Eth for further management of sx.  Rx percocet temporarily.  Discussed plan with pt- he agreed.  Return precautions advised.  Garlon Hatchet, PA-C 10/20/12 [phone]

## 2012-10-20 NOTE — ED Provider Notes (Signed)
History     CSN: [id number]  Arrival date & time 10/20/12  7829   First MD Initiated Contact with Patient 10/20/12 [phone]      Chief Complaint  Patient presents with  . Torticollis   HPI  History provided by the patient. Patient is an 79 year old male with history of hypertension, hyperlipidemia, CAD status post stenting who presents with complaints of numbness and discomfort sensation to posterior scalp and neck. Symptoms began around 4 PM yesterday afternoon and have been persistent without any change. Patient reports similar symptoms in the past. On those occasions he reports previous episodes of similar symptoms while looking to the left and down. He reports symptoms would improve if he turned his head and look up to the right. He has discussed this in the past with his primary care provider many months ago states that he did not make much of his symptoms at that time. This evening and this morning however patient's symptoms have been persistent without any relief or change during his head and neck movements. Symptoms do not radiate elsewhere. Discomforts are mild described as a 1-10 on the pain scale. They do not radiate down the upper extremities. No weakness or numbness in the hands. No visual changes. No facial droop. No speech changes. No balance problems.    Past Medical History  Diagnosis Date  . Coronary artery disease     post PTCA and stenting of his left circumflex artery in 12/08  . Hypertension   . Hyperlipidemia   . Ischemia   . Shortness of breath   . Enlarged prostate     Past Surgical History  Procedure Laterality Date  . Cardiac catheterization  06/2007    Est. EF of 55-60%PTCA and stenting of his left circumflex artery in 12/08    Family History  Problem Relation Age of Onset  . Heart failure Mother     possible  . Coronary artery disease Brother     History  Substance Use Topics  . Smoking status: Never Smoker   . Smokeless tobacco: Not on file  .  Alcohol Use: No      Review of Systems  Constitutional: Negative for fever, chills and diaphoresis.  HENT: Negative for hearing loss, ear pain, congestion, sore throat, rhinorrhea, trouble swallowing and tinnitus.   Respiratory: Negative for shortness of breath.   Cardiovascular: Negative for chest pain and palpitations.  Neurological: Positive for numbness. Negative for dizziness, facial asymmetry, speech difficulty, weakness and headaches.  All other systems reviewed and are negative.    Allergies  Iodinated diagnostic agents  Home Medications   Current Outpatient Rx  Name  Route  Sig  Dispense  Refill  . clopidogrel (PLAVIX) 75 MG tablet   Oral   Take 1 tablet (75 mg total) by mouth daily.   15 tablet   0   . diphenhydramine-acetaminophen (TYLENOL PM) 25-500 MG TABS   Oral   Take 2 tablets by mouth at bedtime as needed.         . doxazosin (CARDURA) 4 MG tablet   Oral   Take 4 mg by mouth daily.         . Doxylamine Succinate, Sleep, (SLEEP AID PO)   Oral   Take 1 tablet by mouth at bedtime as needed.           . finasteride (PROPECIA) 1 MG tablet   Oral   Take 1 mg by mouth daily.          Marland Kitchen  fish oil-omega-3 fatty acids 1000 MG capsule   Oral   Take 1 g by mouth daily.           . metoprolol tartrate (LOPRESSOR) 25 MG tablet   Oral   Take 1 tablet (25 mg total) by mouth 2 (two) times daily.   180 tablet   3   . rosuvastatin (CRESTOR) 20 MG tablet   Oral   Take 20 mg by mouth daily.         . simvastatin (ZOCOR) 40 MG tablet   Oral   Take 1 tablet (40 mg total) by mouth every evening.   90 tablet   3     BP 139/87  Pulse 81  Temp(Src) 97.6 F (36.4 C) (Oral)  Resp 18  Ht 5\' 8"  (1.727 m)  Wt 200 lb (90.719 kg)  BMI 30.42 kg/m2  SpO2 99%  Physical Exam  Nursing note and vitals reviewed. Constitutional: He is oriented to person, place, and time. He appears well-developed and well-nourished. No distress.  HENT:  Head:  Normocephalic.  Neck: Normal range of motion. Neck supple. Normal carotid pulses and no JVD present. No spinous process tenderness and no muscular tenderness present. Carotid bruit is not present. No rigidity. No tracheal deviation, no edema and no erythema present. No mass and no thyromegaly present.  Cardiovascular: Normal rate and regular rhythm.   Pulmonary/Chest: Effort normal and breath sounds normal. No stridor. No respiratory distress. He has no wheezes. He has no rales.  Abdominal: Soft.  Musculoskeletal: Normal range of motion. He exhibits no edema and no tenderness.  Lymphadenopathy:    He has no cervical adenopathy.  Neurological: He is alert and oriented to person, place, and time. He has normal strength. No cranial nerve deficit or sensory deficit. Gait normal.  Skin: Skin is warm. He is not diaphoretic.  Psychiatric: He has a normal mood and affect. His behavior is normal.    ED Course  Procedures   Ct Cervical Spine Wo Contrast  10/20/2012  *RADIOLOGY REPORT*  Clinical Data: Pain and numbness at the left posterior head and neck.  CT CERVICAL SPINE WITHOUT CONTRAST  Technique:  Multidetector CT imaging of the cervical spine was performed. Multiplanar CT image reconstructions were also generated.  Comparison: CT of the cervical spine performed 08/09/2009  Findings: There is no evidence of fracture or subluxation. Vertebral bodies demonstrate normal height and alignment.  There is mild narrowing of the intervertebral disc spaces at C5-C6 and C6- C7, with associated small anterior and posterior disc osteophyte complexes.  Prevertebral soft tissues are within normal limits.  The visualized sternocleidomastoid musculature is grossly symmetric in appearance.  Mild calcification is noted at the carotid bifurcations, more prominent on the right.  A small focus of calcification is noted at the right thyroid lobe, without evidence of a dominant mass. Minimal scarring is noted at the lung  apices.  Mild apparent degenerative change is noted at the temporomandibular joints bilaterally.  IMPRESSION:  1.  No evidence of fracture or subluxation along the cervical spine. 2.  Mild degenerative change at the lower cervical spine. 3.  Mild calcification at the carotid bifurcations, more prominent on the right. 4.  Minimal scarring at the lung apices. 5.  Mild apparent degenerative change at the temporomandibular joints bilaterally. 6.  Visualized musculature appears grossly symmetric; no definite soft tissue findings seen to explain the patient's left-sided pain and numbness.   Original Report Authenticated By: Tonia Ghent, M.D.  1. Neck pain on left side       MDM  3:30AM patient seen and evaluated. Patient appears comfortable in no acute distress. He has full range of motion of his neck without any masses or palpable tenderness. No carotid bruits.  Symptoms unusual possibly radicular in nature. Case discussed with attending physician. Will obtain CT scan for evaluation of spine       Date: 10/20/2012  Rate: 57  Rhythm: normal sinus rhythm  QRS Axis: left  Intervals: normal  ST/T Wave abnormalities: normal  Conduction Disutrbances:none  Narrative Interpretation: Inferior infarct age undetermined  Old EKG Reviewed: unchanged 08/09/2009    Angus Seller, PA-C 10/21/12 [phone]

## 2012-10-21 NOTE — ED Provider Notes (Signed)
Medical screening examination/treatment/procedure(s) were performed by non-physician practitioner and as supervising physician I was immediately available for consultation/collaboration.  Sunnie Nielsen, MD 10/21/12 [phone]

## 2012-10-21 NOTE — ED Provider Notes (Signed)
Medical screening examination/treatment/procedure(s) were performed by non-physician practitioner and as supervising physician I was immediately available for consultation/collaboration.  Arlie Riker, MD 10/21/12 0651 

## 2012-11-21 ENCOUNTER — Encounter: Payer: Self-pay | Admitting: Cardiovascular Disease

## 2012-11-24 ENCOUNTER — Encounter: Payer: Self-pay | Admitting: Cardiovascular Disease

## 2013-10-05 ENCOUNTER — Ambulatory Visit (INDEPENDENT_AMBULATORY_CARE_PROVIDER_SITE_OTHER): Payer: Commercial Managed Care - HMO | Admitting: Family Medicine

## 2013-10-05 VITALS — BP 116/60 | HR 83 | Temp 98.1°F | Resp 16 | Ht 68.0 in | Wt 206.0 lb

## 2013-10-05 DIAGNOSIS — J209 Acute bronchitis, unspecified: Secondary | ICD-10-CM

## 2013-10-05 DIAGNOSIS — J029 Acute pharyngitis, unspecified: Secondary | ICD-10-CM

## 2013-10-05 DIAGNOSIS — R05 Cough: Secondary | ICD-10-CM

## 2013-10-05 DIAGNOSIS — R059 Cough, unspecified: Secondary | ICD-10-CM

## 2013-10-05 MED ORDER — HYDROCOD POLST-CHLORPHEN POLST 10-8 MG/5ML PO LQCR: 5.0000 mL | Freq: Two times a day (BID) | ORAL | Status: DC | PRN

## 2013-10-05 MED ORDER — AZITHROMYCIN 250 MG PO TABS: ORAL_TABLET | ORAL | Status: DC

## 2013-10-05 NOTE — Progress Notes (Signed)
Patient ID: Patrick Knapp MRN: [id number], DOB: 25-Apr-1928, 80 y.o. Date of Encounter: 10/05/2013, 8:52 AM  Primary Physician: Hoyle Sauer, MD  Chief Complaint:  Chief Complaint  Patient presents with  . Sore Throat    X 3 days  . Cough    X 3 days    HPI: 80 y.o. year old male presents with a 4 day history of nasal congestion, post nasal drip, sore throat, and cough. Mild sinus pressure. Afebrile. No chills. Nasal congestion thick and green/yellow. Cough is productive of green/yellow sputum and not associated with time of day. Ears feel full, leading to sensation of muffled hearing. Has tried OTC cold preps without success. No GI complaints.   No sick contacts, recent antibiotics, or recent travels.   No leg trauma, sedentary periods, h/o cancer, or tobacco use.  Past Medical History  Diagnosis Date  . Coronary artery disease     post PTCA and stenting of his left circumflex artery in 12/08  . Hypertension   . Hyperlipidemia   . Ischemia   . Shortness of breath   . Enlarged prostate   . GERD (gastroesophageal reflux disease)   . Arthritis      Home Meds: Prior to Admission medications   Medication Sig Start Date End Date Taking? Authorizing Provider  clopidogrel (PLAVIX) 75 MG tablet Take 1 tablet (75 mg total) by mouth daily. 06/01/11  Yes Vesta Mixer, MD  doxazosin (CARDURA) 4 MG tablet Take 4 mg by mouth daily.   Yes Historical Provider, MD  Doxylamine Succinate, Sleep, (SLEEP AID PO) Take 1 tablet by mouth at bedtime as needed.     Yes Historical Provider, MD  finasteride (PROPECIA) 1 MG tablet Take 1 mg by mouth daily.    Yes Historical Provider, MD  metoprolol tartrate (LOPRESSOR) 25 MG tablet Take 1 tablet (25 mg total) by mouth 2 (two) times daily. 03/13/11  Yes Vesta Mixer, MD  azithromycin (ZITHROMAX Z-PAK) 250 MG tablet Take as directed on pack 10/05/13   Elvina Sidle, MD  chlorpheniramine-HYDROcodone [facility]) 10-8 MG/5ML  LQCR Take 5 mLs by mouth every 12 (twelve) hours as needed for cough. 10/05/13   Elvina Sidle, MD  diphenhydramine-acetaminophen (TYLENOL PM) 25-500 MG TABS Take 2 tablets by mouth at bedtime as needed.    Historical Provider, MD  fish oil-omega-3 fatty acids 1000 MG capsule Take 1 g by mouth daily.      Historical Provider, MD  simvastatin (ZOCOR) 40 MG tablet Take 1 tablet (40 mg total) by mouth every evening. 02/29/12 02/28/13  Vesta Mixer, MD    Allergies:  Allergies  Allergen Reactions  . Iodinated Diagnostic Agents     History   Social History  . Marital Status: Married    Spouse Name: N/A    Number of Children: N/A  . Years of Education: N/A   Occupational History  . Not on file.   Social History Main Topics  . Smoking status: Never Smoker   . Smokeless tobacco: Not on file  . Alcohol Use: No  . Drug Use: No  . Sexual Activity:    Other Topics Concern  . Not on file   Social History Narrative  . No narrative on file     Review of Systems: Constitutional: negative for chills, fever, night sweats or weight changes Cardiovascular: negative for chest pain or palpitations Respiratory: negative for hemoptysis, wheezing, or shortness of breath Abdominal: negative for abdominal pain, nausea, vomiting or  diarrhea Dermatological: negative for rash Neurologic: negative for headache   Physical Exam: Blood pressure 116/60, pulse 83, temperature 98.1 F (36.7 C), temperature source Oral, resp. rate 16, height 5\' 8"  (1.727 m), weight 206 lb (93.441 kg), SpO2 95.00%., Body mass index is 31.33 kg/(m^2). General: Well developed, well nourished, in no acute distress. Head: Normocephalic, atraumatic, eyes without discharge, sclera non-icteric, nares are congested. Bilateral auditory canals clear, TM's are without perforation, pearly grey with reflective cone of light bilaterally. No sinus TTP. Oral cavity moist, dentition normal. Posterior pharynx with post nasal drip and  mild erythema. No peritonsillar abscess or tonsillar exudate. Neck: Supple. No thyromegaly. Full ROM. No lymphadenopathy. Lungs: Coarse breath sounds bilaterally without wheezes, rales, or rhonchi. Breathing is unlabored.  Heart: RRR with S1 S2. No murmurs, rubs, or gallops appreciated. Msk:  Strength and tone normal for age. Extremities: No clubbing or cyanosis. No edema. Neuro: Alert and oriented X 3. Moves all extremities spontaneously. CNII-XII grossly in tact. Psych:  Responds to questions appropriately with a normal affect.   Labs:   ASSESSMENT AND PLAN:  80 y.o. year old male with bronchitis. -Cough - Plan: chlorpheniramine-HYDROcodone (TUSSIONEX PENNKINETIC ER) 10-8 MG/5ML LQCR, azithromycin (ZITHROMAX Z-PAK) 250 MG tablet  Acute pharyngitis - Plan: chlorpheniramine-HYDROcodone (TUSSIONEX PENNKINETIC ER) 10-8 MG/5ML LQCR, azithromycin (ZITHROMAX Z-PAK) 250 MG tablet   -Mucinex -Tylenol/Motrin prn -Rest/fluids -RTC precautions -RTC 3-5 days if no improvement  Signed, Elvina SidleKurt Georgana Romain, MD 10/05/2013 8:52 AM

## 2013-10-05 NOTE — Patient Instructions (Signed)

## 2014-02-08 ENCOUNTER — Other Ambulatory Visit: Payer: Self-pay | Admitting: Nurse Practitioner

## 2014-02-08 DIAGNOSIS — E785 Hyperlipidemia, unspecified: Secondary | ICD-10-CM

## 2014-02-17 ENCOUNTER — Telehealth: Payer: Self-pay | Admitting: Cardiovascular Disease

## 2014-02-17 NOTE — Telephone Encounter (Signed)
Received request from Nurse fax box, documents faxed for surgical clearance. To: [facility] Fax number: [phone] Attention: 8.12.15/km

## 2014-03-17 ENCOUNTER — Other Ambulatory Visit: Payer: Commercial Managed Care - HMO

## 2014-03-17 ENCOUNTER — Encounter: Payer: Self-pay | Admitting: Cardiovascular Disease

## 2014-03-17 ENCOUNTER — Ambulatory Visit (INDEPENDENT_AMBULATORY_CARE_PROVIDER_SITE_OTHER): Payer: Commercial Managed Care - HMO | Admitting: Cardiovascular Disease

## 2014-03-17 VITALS — BP 130/80 | HR 68 | Ht 68.0 in | Wt 201.6 lb

## 2014-03-17 DIAGNOSIS — I2510 Atherosclerotic heart disease of native coronary artery without angina pectoris: Secondary | ICD-10-CM

## 2014-03-17 DIAGNOSIS — I25119 Atherosclerotic heart disease of native coronary artery with unspecified angina pectoris: Secondary | ICD-10-CM

## 2014-03-17 DIAGNOSIS — I10 Essential (primary) hypertension: Secondary | ICD-10-CM

## 2014-03-17 DIAGNOSIS — I209 Angina pectoris, unspecified: Secondary | ICD-10-CM

## 2014-03-17 DIAGNOSIS — I25118 Atherosclerotic heart disease of native coronary artery with other forms of angina pectoris: Secondary | ICD-10-CM

## 2014-03-17 DIAGNOSIS — E785 Hyperlipidemia, unspecified: Secondary | ICD-10-CM

## 2014-03-17 DIAGNOSIS — R0609 Other forms of dyspnea: Secondary | ICD-10-CM

## 2014-03-17 DIAGNOSIS — R0989 Other specified symptoms and signs involving the circulatory and respiratory systems: Secondary | ICD-10-CM

## 2014-03-17 LAB — BASIC METABOLIC PANEL
BUN: 21 mg/dL (ref 6–23)
CALCIUM: 9.1 mg/dL (ref 8.4–10.5)
CO2: 24 mEq/L (ref 19–32)
CREATININE: 1.2 mg/dL (ref 0.4–1.5)
Chloride: 107 mEq/L (ref 96–112)
GFR: 60.19 mL/min (ref >60.00)
GLUCOSE: 91 mg/dL (ref 70–99)
Potassium: 3.8 mEq/L (ref 3.5–5.1)
Sodium: 139 mEq/L (ref 135–145)

## 2014-03-17 LAB — HEPATIC FUNCTION PANEL
ALBUMIN: 3.9 g/dL (ref 3.5–5.2)
ALT: 22 U/L (ref 0–53)
AST: 21 U/L (ref 0–37)
Alkaline Phosphatase: 73 U/L (ref 39–117)
BILIRUBIN TOTAL: 1.1 mg/dL (ref 0.2–1.2)
Bilirubin, Direct: 0.1 mg/dL (ref 0.0–0.3)
Total Protein: 7.7 g/dL (ref 6.0–8.3)

## 2014-03-17 LAB — LIPID PANEL
CHOL/HDL RATIO: 3
CHOLESTEROL: 115 mg/dL (ref 0–200)
HDL: 37.2 mg/dL — AB (ref >39.00)
LDL Cholesterol: 49 mg/dL (ref 0–99)
NonHDL: 77.8
TRIGLYCERIDES: 142 mg/dL (ref 0.0–149.0)
VLDL: 28.4 mg/dL (ref 0.0–40.0)

## 2014-03-17 NOTE — Assessment & Plan Note (Signed)
His BP is well controlled today 

## 2014-03-17 NOTE — Patient Instructions (Signed)
Your physician has requested that you have a lexiscan myoview. For further information please visit [email or web address]. Please follow instruction sheet, as given.  Your physician recommends that you continue on your current medications as directed. Please refer to the Current Medication list given to you today.  Your physician wants you to follow-up in: 1 year with Dr. Nahser.  You will receive a reminder letter in the mail two months in advance. If you don't receive a letter, please call our office to schedule the follow-up appointment.  

## 2014-03-17 NOTE — Progress Notes (Signed)
Patrick Knapp Date of Birth  1927/10/15 [facility] [address]    [address] [place], [place]  [address] [phone]  Fax  [phone]  [facility] [address] [place], [place]  [address] [phone]   Fax [phone]  Problem list: 1. Coronary artery disease 2. Hypertension 3. Hyperlipidemia 4. BPH  History of Present Illness:  Patrick Knapp is an 80 yo with a hx of CAD, HTN, hyperlipidemia, and BPH.  He is not had any episodes of chest pain or shortness breath. He's exploring some alternate therapies for his BPH including cryotherapy.  He works at [employer] for 4 hours every day. He does not get any other regular exercise.  Sept. 9, 2015:  Patrick Knapp has had some DOE walking through the airport terminal .   No CP -   Similar to how he felt prior to stenting  He has a right knee injury - needs knee replacement on Nov. 23. ( [place])  Has not been able to exercise.     Current Outpatient Prescriptions on File Prior to Visit  Medication Sig Dispense Refill  . clopidogrel (PLAVIX) 75 MG tablet Take 1 tablet (75 mg total) by mouth daily.  15 tablet  0  . doxazosin (CARDURA) 4 MG tablet Take 4 mg by mouth daily.      . Doxylamine Succinate, Sleep, (SLEEP AID PO) Take 1 tablet by mouth at bedtime as needed.        . finasteride (PROPECIA) 1 MG tablet Take 1 mg by mouth daily.       . fish oil-omega-3 fatty acids 1000 MG capsule Take 1 g by mouth daily.        . metoprolol tartrate (LOPRESSOR) 25 MG tablet Take 1 tablet (25 mg total) by mouth 2 (two) times daily.  180 tablet  3  . simvastatin (ZOCOR) 40 MG tablet Take 1 tablet (40 mg total) by mouth every evening.  90 tablet  3   No current facility-administered medications on file prior to visit.    Allergies  Allergen Reactions  . Iodinated Diagnostic Agents     Past Medical History  Diagnosis Date  . Coronary artery disease     post PTCA and stenting of his left circumflex artery in 12/08  .  Hypertension   . Hyperlipidemia   . Ischemia   . Shortness of breath   . Enlarged prostate   . GERD (gastroesophageal reflux disease)   . Arthritis     Past Surgical History  Procedure Laterality Date  . Cardiac catheterization  06/2007    Est. EF of 55-60%PTCA and stenting of his left circumflex artery in 12/08    History  Smoking status  . Never Smoker   Smokeless tobacco  . Not on file    History  Alcohol Use No    Family History  Problem Relation Age of Onset  . Heart failure Mother     possible  . Heart disease Mother   . Coronary artery disease Brother   . Heart disease Brother   . Heart disease Father     Reviw of Systems:  Reviewed in the HPI.  All other systems are negative.  Physical Exam: BP 130/80  Pulse 68  Ht  (1.727 m)  Wt 201 lb 9.6 oz (91.445 kg)  BMI 30.66 kg/m2 The patient is alert and oriented x 3.  The mood and affect are normal.   Skin: warm and dry.  Color is  normal.    HEENT:   Normocephalic/atraumatic. He is normal carotids. There is no JVD.  Lungs: Lungs are clear to auscultation.   Heart: Regular rate S1-S2.    Abdomen: Normal bowel sounds. There is no hepatosplenomegaly  Extremities:  No clubbing cyanosis or edema.  Neuro:  Nonfocal. Exam is normal.    ECG: Sept. 9, 2015:  NSR at 68.  LAD  Assessment / Plan:

## 2014-03-17 NOTE — Assessment & Plan Note (Signed)
Male has a history of CAD with stenting of his left circumflex artery in 2008. His presenting symptoms at that time were shortness breath with exertion. He now presents with similar symptoms. He denies any chest pain but he did not have any chest pain pressure is other episode of angina. He's had some knee problems and has not been exercising quite as much as she would like so it's possible that his symptoms are due to a generalized deconditioning. He needs to have a knee replacement so we will need to further investigate whether or not he has recurrent coronary artery disease.  We will schedule him for a lexiscan Myoview .   I will see him again in one year unless the Myoview shows abnormalities.  He'll need a cardiac catheterization if the Myoview is abnormal.

## 2014-03-24 ENCOUNTER — Ambulatory Visit (HOSPITAL_COMMUNITY): Payer: Medicare HMO | Attending: Cardiology | Admitting: Radiology

## 2014-03-24 VITALS — BP 129/65 | HR 53 | Ht 68.0 in | Wt 202.0 lb

## 2014-03-24 DIAGNOSIS — I10 Essential (primary) hypertension: Secondary | ICD-10-CM | POA: Diagnosis not present

## 2014-03-24 DIAGNOSIS — I25119 Atherosclerotic heart disease of native coronary artery with unspecified angina pectoris: Secondary | ICD-10-CM

## 2014-03-24 DIAGNOSIS — I2510 Atherosclerotic heart disease of native coronary artery without angina pectoris: Secondary | ICD-10-CM

## 2014-03-24 DIAGNOSIS — R0989 Other specified symptoms and signs involving the circulatory and respiratory systems: Principal | ICD-10-CM | POA: Insufficient documentation

## 2014-03-24 DIAGNOSIS — R0609 Other forms of dyspnea: Secondary | ICD-10-CM | POA: Diagnosis present

## 2014-03-24 MED ORDER — TECHNETIUM TC 99M SESTAMIBI GENERIC - CARDIOLITE
11.0000 | Freq: Once | INTRAVENOUS | Status: AC | PRN
Start: 2014-03-24 — End: 2014-03-24
  Administered 2014-03-24: 11 via INTRAVENOUS

## 2014-03-24 MED ORDER — TECHNETIUM TC 99M SESTAMIBI GENERIC - CARDIOLITE
33.0000 | Freq: Once | INTRAVENOUS | Status: AC | PRN
  Administered 2014-03-24: 33 via INTRAVENOUS

## 2014-03-24 MED ORDER — REGADENOSON 0.4 MG/5ML IV SOLN
0.4000 mg | Freq: Once | INTRAVENOUS | Status: AC
  Administered 2014-03-24: 0.4 mg via INTRAVENOUS

## 2014-03-24 NOTE — Progress Notes (Signed)
[facility] SITE 3 NUCLEAR MED [address] [place], [place] [address] [phone]    Cardiology Nuclear Med Study  Patrick Knapp is a 80 y.o. male     MRN : [id number]     DOB: [birth year]  Procedure Date: 03/24/2014  Nuclear Med Background Indication for Stress Test:  Evaluation for Ischemia and Surgical Clearance TKR- Dr. Despina Hick History:  CAD, MPI 2010 (normal) EF 69% Cardiac Risk Factors: Hypertension  Symptoms:  DOE   Nuclear Pre-Procedure Caffeine/Decaff Intake:  None NPO After: 7:00pm   Lungs:  clear O2 Sat: 96% on room air. IV 0.9% NS with Angio Cath:  22g  IV Site: R Hand  IV Started by:  Bonnita Levan, RN  Chest Size (in):  48 Cup Size: n/a  Height:  (1.727 m)  Weight:  202 lb (91.627 kg)  BMI:  Body mass index is 30.72 kg/(m^2). Tech Comments:  N/A    Nuclear Med Study 1 or 2 day study: 1 day  Stress Test Type:  Lexiscan  Reading MD: N/A  Order Authorizing Provider:  Kristeen Miss, MD  Resting Radionuclide: Technetium 36m Sestamibi  Resting Radionuclide Dose: 11.0 mCi   Stress Radionuclide:  Technetium 44m Sestamibi  Stress Radionuclide Dose: 33.0 mCi           Stress Protocol Rest HR: 53 Stress HR: 80  Rest BP: 129/65 Stress BP: 120/73  Exercise Time (min): n/a METS: n/a           Dose of Adenosine (mg):  n/a Dose of Lexiscan: 0.4 mg  Dose of Atropine (mg): n/a Dose of Dobutamine: n/a mcg/kg/min (at max HR)  Stress Test Technologist: Nelson Chimes, BS-ES  Nuclear Technologist:  Harlow Asa, CNMT     Rest Procedure:  Myocardial perfusion imaging was performed at rest 45 minutes following the intravenous administration of Technetium 64m Sestamibi. Rest ECG: NSR - Normal EKG  Stress Procedure:  The patient received IV Lexiscan 0.4 mg over 15-seconds.  Technetium 37m Sestamibi injected at 30-seconds.  Quantitative spect images were obtained after a 45 minute delay. During the infusion of Lexiscan the patient complained of  weakness.  This resolved in recovery.  Stress ECG: No significant change from baseline ECG  QPS Raw Data Images:  Normal; no motion artifact; normal heart/lung ratio. Stress Images:  Normal homogeneous uptake in all areas of the myocardium. Rest Images:  Normal homogeneous uptake in all areas of the myocardium. Subtraction (SDS):  There is no evidence of scar or ischemia. Transient Ischemic Dilatation (Normal <1.22):  1.06 Lung/Heart Ratio (Normal <0.45):  0.41  Quantitative Gated Spect Images QGS EDV:  87 ml QGS ESV:  41 ml  Impression Exercise Capacity:  Lexiscan with no exercise. BP Response:  Normal blood pressure response. Clinical Symptoms:  Weakness ECG Impression:  No significant ST segment change suggestive of ischemia. Comparison with Prior Nuclear Study: No images to compare  Overall Impression:  Normal stress nuclear study.  LV Ejection Fraction: 53%.  LV Wall Motion:  NL LV Function; NL Wall Motion  Patrick Knapp 03/24/2014

## 2014-05-13 ENCOUNTER — Ambulatory Visit: Payer: Self-pay | Admitting: Orthopedic Surgery

## 2014-05-13 NOTE — Progress Notes (Signed)
Preoperative surgical orders have been place into the [facility] system [facility] on 05/13/2014, 11:29 AM  by Patrica DuelPERKINS, Brytni Dray for surgery on 05-31-2014.  Preop Total Knee orders including Experal, IV Tylenol, and IV Decadron as long as there are no contraindications to the above medications. Avel Peacerew Raquell Richer, PA-C

## 2014-05-18 ENCOUNTER — Ambulatory Visit: Payer: Self-pay | Admitting: Orthopedic Surgery

## 2014-05-18 NOTE — H&P (Signed)
Patrick Knapp DOB: 06/26/1928 Married / Language: English / Race: White Male Date of Admission:  05-31-2014 Chief Complaint:  Right Knee Pain History of Present Illness  The patient is a 80 year old male who comes in  for a preoperative History and Physical. The patient is scheduled for a right total knee arthroplasty to be performed by Dr. Frank V. Aluisio, MD at [facility] on 05/31/2014. The patient is a 80 year old male who presents  for follow up of their knee. The patient is being followed for their right knee pain and osteoarthritis. They are now 14 month(s) out from Synvisc series (that provided relief of severe pain). Symptoms reported today include: pain and aching. The following medication has been used for pain control: none. The patient indicates that they have questions or concerns today regarding total knee arthroplasty. He said the injections have not helped at all. He does not have severe pain now but he is unable to do the things he desires. He does have daily pain, including pain at night. This limits what he can and can not do. All of the interventions to date, including cortisone and visco supplements have not been of substantial benefit. He would like to be more active and do the things he desires. The knee is the only thing holding him back at this time. He is ready to get it replaced at this time.  They have been treated conservatively in the past for the above stated problem and despite conservative measures, they continue to have progressive pain and severe functional limitations and dysfunction. They have failed non-operative management including home exercise, medications, and injections. It is felt that they would benefit from undergoing total joint replacement. Risks and benefits of the procedure have been discussed with the patient and they elect to proceed with surgery. There are no active contraindications to surgery such as ongoing infection or rapidly  progressive neurological disease.  Allergies No Known Drug Allergies  Problem List/Past Medical  Primary osteoarthritis of right knee (M17.11) High blood pressure Coronary artery disease History of Compression Fracture  Family History  Heart Disease Mother. mother Congestive Heart Failure mother and brother Cancer Father. father Cerebrovascular Accident Mother. mother  Social History Exercise Exercises weekly; does other Exercises daily; does running / walking Former drinker 02/05/2014: In the past drank wine only occasionally per week Living situation live with spouse Children 5 or more Current work status working part time Marital status married Tobacco / smoke exposure 02/05/2014: no no Tobacco use Never smoker. 02/05/2014 never smoker No history of drug/alcohol rehab Not under pain contract Number of flights of stairs before winded 1 Alcohol use current drinker; only occasionally per week Drug/Alcohol Rehab (Currently) no Drug/Alcohol Rehab (Previously) no Illicit drug use no Pain Contract no  Medication History  Doxazosin Mesylate (8MG Tablet, Oral) Active. Finasteride (5MG Tablet, Oral) Active. Tylenol Extra Strength (500MG Tablet, Oral) Active. Metoprolol Tartrate (50MG Tablet, Oral) Active. Plavix (75MG Tablet, Oral daily) Active. Atorvastatin Calcium (20MG Tablet, Oral) Active. Pantoprazole Sodium (40MG Tablet DR, Oral) Active. Melatonin (Oral) Specific dose unknown - Active.  Past Surgical History  Arthroscopy of Knee left Heart Stents One stent Cataract Extraction-Left Date: 04/2014.  Review of Systems  General Not Present- Chills, Fatigue, Fever, Memory Loss, Night Sweats, Weight Gain and Weight Loss. Skin Not Present- Eczema, Hives, Itching, Lesions and Rash. HEENT Not Present- Dentures, Double Vision, Headache, Hearing Loss, Tinnitus and Visual Loss. Respiratory Not Present- Allergies, Chronic Cough, Coughing up    blood, Shortness of breath at rest and Shortness of breath with exertion. Cardiovascular Not Present- Chest Pain, Difficulty Breathing Lying Down, Murmur, Palpitations, Racing/skipping heartbeats and Swelling. Gastrointestinal Not Present- Abdominal Pain, Bloody Stool, Constipation, Diarrhea, Difficulty Swallowing, Heartburn, Jaundice, Loss of appetitie, Nausea and Vomiting. Male Genitourinary Not Present- Blood in Urine, Discharge, Flank Pain, Incontinence, Painful Urination, Urgency, Urinary frequency, Urinary Retention, Urinating at Night and Weak urinary stream. Musculoskeletal Not Present- Back Pain, Joint Pain, Joint Swelling, Morning Stiffness, Muscle Pain, Muscle Weakness and Spasms. Neurological Not Present- Blackout spells, Difficulty with balance, Dizziness, Paralysis, Tremor and Weakness. Psychiatric Not Present- Insomnia.   Vitals  Weight: 200 lb Height: 68.5in Height was reported by patient. Body Surface Area: 2.05 m Body Mass Index: 29.97 kg/m  BP: 134/68 (Sitting, Right Arm, Standard)   Physical Exam  General Mental Status -Alert, cooperative and good historian. General Appearance-pleasant, Not in acute distress. Orientation-Oriented X3. Build & Nutrition-Well nourished and Well developed.  Head and Neck Head-normocephalic, atraumatic . Neck Global Assessment - supple, no bruit auscultated on the right, no bruit auscultated on the left.  Eye Pupil - Bilateral-Dilated((just had eye exam on day of H&P for recent cataract surgery)), Regular and Round. Motion - Bilateral-EOMI.  Chest and Lung Exam Auscultation Breath sounds - clear at anterior chest wall and clear at posterior chest wall. Adventitious sounds - No Adventitious sounds.  Cardiovascular Auscultation Rhythm - Regular rate and rhythm. Heart Sounds - S1 WNL and S2 WNL. Murmurs & Other Heart Sounds - Auscultation of the heart reveals - No  Murmurs.  Abdomen Palpation/Percussion Tenderness - Abdomen is non-tender to palpation. Rigidity (guarding) - Abdomen is soft. Auscultation Auscultation of the abdomen reveals - Bowel sounds normal.  Male Genitourinary Note: Not done, not pertinent to present illness   Musculoskeletal Note: He is alert and oriented. No apparent distress. Evaluation of his hips show a normal motion, no discomfort. The right knee shows a varus deformity. Range is about 5-130. There is moderate crepitus on range of motion. He is tender medial greater than lateral. No instability. The left knee shows no effusion. Range is 0-135. No deformity. No tenderness. No instability.  RADIOGRAPHS: AP both knees and lateral show bone on bone arthritis in the medial and patellofemoral compartments of the right knee. The left knee has medial narrowing but not bone on bone.   Assessment & Plan Primary osteoarthritis of right knee (M17.11) Note:Plan is for a Right Total Knee Replacement by Dr. Lequita HaltAluisio.  Plan is to go to rehab after surgery  PCP - Dr. Virgel ManifoldAva Cards - Dr. Melburn PopperNasher - Patient has been seen preoperatively and felt to be stable for surgery. He was instructed to stop the Plavix 7 days prior to surgery.  Topical TXA - CAD, Heart Stenting  Signed electronically by Lauraine RinneAlexzandrew L Perkins, III PA-C

## 2014-05-24 ENCOUNTER — Ambulatory Visit (HOSPITAL_COMMUNITY)
Admission: RE | Admit: 2014-05-24 | Discharge: 2014-05-24 | Disposition: A | Discharge status: Home / Self Care | Payer: Commercial Managed Care - HMO | Source: Ambulatory Visit | Attending: Anesthesiology | Admitting: Anesthesiology

## 2014-05-24 ENCOUNTER — Encounter (HOSPITAL_COMMUNITY): Payer: Self-pay

## 2014-05-24 ENCOUNTER — Encounter (HOSPITAL_COMMUNITY)
Admission: RE | Admit: 2014-05-24 | Discharge: 2014-05-24 | Disposition: A | Discharge status: Home / Self Care | Payer: Commercial Managed Care - HMO | Source: Ambulatory Visit | Attending: Orthopedic Surgery | Admitting: Orthopedic Surgery

## 2014-05-24 DIAGNOSIS — J8410 Pulmonary fibrosis, unspecified: Secondary | ICD-10-CM | POA: Diagnosis not present

## 2014-05-24 DIAGNOSIS — I2510 Atherosclerotic heart disease of native coronary artery without angina pectoris: Secondary | ICD-10-CM

## 2014-05-24 DIAGNOSIS — M4854XG Collapsed vertebra, not elsewhere classified, thoracic region, subsequent encounter for fracture with delayed healing: Secondary | ICD-10-CM | POA: Insufficient documentation

## 2014-05-24 DIAGNOSIS — Z01818 Encounter for other preprocedural examination: Secondary | ICD-10-CM | POA: Insufficient documentation

## 2014-05-24 LAB — URINALYSIS, ROUTINE W REFLEX MICROSCOPIC
BILIRUBIN URINE: NEGATIVE
Glucose, UA: NEGATIVE mg/dL
Hgb urine dipstick: NEGATIVE
KETONES UR: NEGATIVE mg/dL
Leukocytes, UA: NEGATIVE
NITRITE: NEGATIVE
PROTEIN: NEGATIVE mg/dL
Specific Gravity, Urine: 1.015 (ref 1.005–1.030)
UROBILINOGEN UA: 0.2 mg/dL (ref 0.0–1.0)
pH: 6 (ref 5.0–8.0)

## 2014-05-24 LAB — APTT: APTT: 33 s (ref 24–37)

## 2014-05-24 LAB — COMPREHENSIVE METABOLIC PANEL
ALBUMIN: 3.9 g/dL (ref 3.5–5.2)
ALT: 14 U/L (ref 0–53)
AST: 20 U/L (ref 0–37)
Alkaline Phosphatase: 82 U/L (ref 39–117)
Anion gap: 14 (ref 5–15)
BILIRUBIN TOTAL: 0.6 mg/dL (ref 0.3–1.2)
BUN: 24 mg/dL — ABNORMAL HIGH (ref 6–23)
CHLORIDE: 103 meq/L (ref 96–112)
CO2: 23 mEq/L (ref 19–32)
CREATININE: 1.24 mg/dL (ref 0.50–1.35)
Calcium: 9.5 mg/dL (ref 8.4–10.5)
GFR calc Af Amer: 60 mL/min — ABNORMAL LOW (ref >90)
GFR calc non Af Amer: 52 mL/min — ABNORMAL LOW (ref >90)
Glucose, Bld: 101 mg/dL — ABNORMAL HIGH (ref 70–99)
Potassium: 4.5 mEq/L (ref 3.7–5.3)
Sodium: 140 mEq/L (ref 137–147)
Total Protein: 8 g/dL (ref 6.0–8.3)

## 2014-05-24 LAB — PROTIME-INR
INR: 1.12 (ref 0.00–1.49)
Prothrombin Time: 14.5 seconds (ref 11.6–15.2)

## 2014-05-24 LAB — CBC
HCT: 43.2 % (ref 39.0–52.0)
Hemoglobin: 14.3 g/dL (ref 13.0–17.0)
MCH: 28.1 pg (ref 26.0–34.0)
MCHC: 33.1 g/dL (ref 30.0–36.0)
MCV: 84.9 fL (ref 78.0–100.0)
Platelets: 140 10*3/uL — ABNORMAL LOW (ref 150–400)
RBC: 5.09 MIL/uL (ref 4.22–5.81)
RDW: 13.4 % (ref 11.5–15.5)
WBC: 6.7 10*3/uL (ref 4.0–10.5)

## 2014-05-24 LAB — ABO/RH: ABO/RH(D): B POS

## 2014-05-24 LAB — SURGICAL PCR SCREEN
MRSA, PCR: NEGATIVE
Staphylococcus aureus: NEGATIVE

## 2014-05-24 NOTE — Progress Notes (Signed)
   05/24/14 0827  OBSTRUCTIVE SLEEP APNEA  Have you ever been diagnosed with sleep apnea through a sleep study? No  Do you snore loudly (loud enough to be heard through closed doors)?  0  Do you often feel tired, fatigued, or sleepy during the daytime? 1  Has anyone observed you stop breathing during your sleep? 0  Do you have, or are you being treated for high blood pressure? 1  BMI more than 35 kg/m2? 0  Age over 78 years old? 1  Neck circumference greater than 40 cm/16 inches? 1  Obstructive Sleep Apnea Score 4

## 2014-05-24 NOTE — Pre-Procedure Instructions (Signed)
EKG REPORT 03/17/14 WITH CARDIOLOGY OFFICE NOTE IN EPIC FROM DR. NAHSER.  NOTE OF CARDIAC CLEARANCE ON CHART DR. Elease HashimotoNAHSER. NUCLEAR STRESS TEST REPORT 03/25/14 IN EPIC. [facility]. PT'S CHART GIVEN TO FOLLOW UP NURSE - BEVERLY NANNY, RN FOR REVIEW OF ALL PREOP TEST RESULTS.

## 2014-05-24 NOTE — Progress Notes (Signed)
   05/24/14 0827  OBSTRUCTIVE SLEEP APNEA  Have you ever been diagnosed with sleep apnea through a sleep study? No  Do you snore loudly (loud enough to be heard through closed doors)?  0  Do you often feel tired, fatigued, or sleepy during the daytime? 1  Has anyone observed you stop breathing during your sleep? 0  Do you have, or are you being treated for high blood pressure? 1  BMI more than 35 kg/m2? 0  Age over 705 years old? 1  Neck circumference greater than 40 cm/16 inches? 1  Gender: 1  Obstructive Sleep Apnea Score 5

## 2014-05-24 NOTE — Patient Instructions (Addendum)
[facility]  ON:  Monday  November 23  REPORT TO  SHORT STAY CENTER AT:  9:40 AM   DO NOT EAT OR DRINK ANYTHING AFTER MIDNIGHT THE NIGHT BEFORE YOUR SURGERY.  YOU MAY BRUSH YOUR TEETH, RINSE OUT YOUR MOUTH--BUT NO WATER, NO FOOD, NO CHEWING GUM, NO MINTS, NO CANDIES, NO CHEWING TOBACCO.  PLEASE TAKE THE FOLLOWING MEDICATIONS THE AM OF YOUR SURGERY WITH A FEW SIPS OF WATER:  ATORVASTATIN, DOXAZOSIN, FINASTERIDE, METOPROLOL, TRAMADOL.    DO NOT BRING VALUABLES, MONEY, CREDIT CARDS.  DO NOT WEAR JEWELRY, MAKE-UP, NAIL POLISH AND NO METAL PINS OR CLIPS IN YOUR HAIR. CONTACT LENS, DENTURES / PARTIALS, GLASSES SHOULD NOT BE WORN TO SURGERY AND IN MOST CASES-HEARING AIDS WILL NEED TO BE REMOVED.  BRING YOUR GLASSES CASE, ANY EQUIPMENT NEEDED FOR YOUR CONTACT LENS. FOR PATIENTS ADMITTED TO THE HOSPITAL--CHECK OUT TIME THE DAY OF DISCHARGE IS 11:00 AM.  ALL INPATIENT ROOMS ARE PRIVATE - WITH BATHROOM, TELEPHONE, TELEVISION AND WIFI INTERNET.   PLEASE BE AWARE THAT YOU MAY NEED ADDITIONAL BLOOD DRAWN DAY OF YOUR SURGERY  _______________________________________________________________________   [facility] - Preparing for Surgery Before surgery, you can play an important role.  Because skin is not sterile, your skin needs to be as free of germs as possible.  You can reduce the number of germs on your skin by washing with CHG (chlorahexidine gluconate) soap before surgery.  CHG is an antiseptic cleaner which kills germs and bonds with the skin to continue killing germs even after washing. Please DO NOT use if you have an allergy to CHG or antibacterial soaps.  If your skin becomes reddened/irritated stop using the CHG and inform your nurse when you arrive at Short Stay. Do not shave (including legs and underarms) for at least 48 hours prior to the first CHG shower.  You may shave your face/neck. Please follow these instructions carefully:  1.  Shower with  CHG Soap the night before surgery and the  morning of Surgery.  2.  If you choose to wash your hair, wash your hair first as usual with your  normal  shampoo.  3.  After you shampoo, rinse your hair and body thoroughly to remove the  shampoo.                           4.  Use CHG as you would any other liquid soap.  You can apply chg directly  to the skin and wash                       Gently with a scrungie or clean washcloth.  5.  Apply the CHG Soap to your body ONLY FROM THE NECK DOWN.   Do not use on face/ open                           Wound or open sores. Avoid contact with eyes, ears mouth and genitals (private parts).                       Wash face,  Genitals (private parts) with your normal soap.             6.  Wash thoroughly, paying special attention to the area where your surgery  will be performed.  7.  Thoroughly rinse your body  with warm water from the neck down.  8.  DO NOT shower/wash with your normal soap after using and rinsing off  the CHG Soap.                9.  Pat yourself dry with a clean towel.            10.  Wear clean pajamas.            11.  Place clean sheets on your bed the night of your first shower and do not  sleep with pets. Day of Surgery : Do not apply any lotions/deodorants the morning of surgery.  Please wear clean clothes to the hospital/surgery center.  FAILURE TO FOLLOW THESE INSTRUCTIONS MAY RESULT IN THE CANCELLATION OF YOUR SURGERY PATIENT SIGNATURE_________________________________  NURSE SIGNATURE__________________________________  ________________________________________________________________________   Patrick Knapp  An incentive spirometer is a tool that can help keep your lungs clear and active. This tool measures how well you are filling your lungs with each breath. Taking long deep breaths may help reverse or decrease the chance of developing breathing (pulmonary) problems (especially infection) following:  A long period of  time when you are unable to move or be active. BEFORE THE PROCEDURE   If the spirometer includes an indicator to show your best effort, your nurse or respiratory therapist will set it to a desired goal.  If possible, sit up straight or lean slightly forward. Try not to slouch.  Hold the incentive spirometer in an upright position. INSTRUCTIONS FOR USE   Sit on the edge of your bed if possible, or sit up as far as you can in bed or on a chair.  Hold the incentive spirometer in an upright position.  Breathe out normally.  Place the mouthpiece in your mouth and seal your lips tightly around it.  Breathe in slowly and as deeply as possible, raising the piston or the ball toward the top of the column.  Hold your breath for 3-5 seconds or for as long as possible. Allow the piston or ball to fall to the bottom of the column.  Remove the mouthpiece from your mouth and breathe out normally.  Rest for a few seconds and repeat Steps 1 through 7 at least 10 times every 1-2 hours when you are awake. Take your time and take a few normal breaths between deep breaths.  The spirometer may include an indicator to show your best effort. Use the indicator as a goal to work toward during each repetition.  After each set of 10 deep breaths, practice coughing to be sure your lungs are clear. If you have an incision (the cut made at the time of surgery), support your incision when coughing by placing a pillow or rolled up towels firmly against it. Once you are able to get out of bed, walk around indoors and cough well. You may stop using the incentive spirometer when instructed by your caregiver.  RISKS AND COMPLICATIONS  Take your time so you do not get dizzy or light-headed.  If you are in pain, you may need to take or ask for pain medication before doing incentive spirometry. It is harder to take a deep breath if you are having pain. AFTER USE  Rest and breathe slowly and easily.  It can be helpful  to keep track of a log of your progress. Your caregiver can provide you with a simple table to help with this. If you are using the spirometer at home, follow  these instructions: SEEK MEDICAL CARE IF:   You are having difficultly using the spirometer.  You have trouble using the spirometer as often as instructed.  Your pain medication is not giving enough relief while using the spirometer.  You develop fever of 100.5 F (38.1 C) or higher. SEEK IMMEDIATE MEDICAL CARE IF:   You cough up bloody sputum that had not been present before.  You develop fever of 102 F (38.9 C) or greater.  You develop worsening pain at or near the incision site. MAKE SURE YOU:   Understand these instructions.  Will watch your condition.  Will get help right away if you are not doing well or get worse. Document Released: 11/05/2006 Document Revised: 09/17/2011 Document Reviewed: 01/06/2007 Exit[employer] Patient Information 2014 [employer], [place].   ________________________________________________________________________  WHAT IS A BLOOD TRANSFUSION? Blood Transfusion Information  A transfusion is the replacement of blood or some of its parts. Blood is made up of multiple cells which provide different functions.  Red blood cells carry oxygen and are used for blood loss replacement.  White blood cells fight against infection.  Platelets control bleeding.  Plasma helps clot blood.  Other blood products are available for specialized needs, such as hemophilia or other clotting disorders. BEFORE THE TRANSFUSION  Who gives blood for transfusions?   Healthy volunteers who are fully evaluated to make sure their blood is safe. This is blood bank blood. Transfusion therapy is the safest it has ever been in the practice of medicine. Before blood is taken from a donor, a complete history is taken to make sure that person has no history of diseases nor engages in risky social behavior (examples are intravenous  drug use or sexual activity with multiple partners). The donor's travel history is screened to minimize risk of transmitting infections, such as malaria. The donated blood is tested for signs of infectious diseases, such as HIV and hepatitis. The blood is then tested to be sure it is compatible with you in order to minimize the chance of a transfusion reaction. If you or a relative donates blood, this is often done in anticipation of surgery and is not appropriate for emergency situations. It takes many days to process the donated blood. RISKS AND COMPLICATIONS Although transfusion therapy is very safe and saves many lives, the main dangers of transfusion include:   Getting an infectious disease.  Developing a transfusion reaction. This is an allergic reaction to something in the blood you were given. Every precaution is taken to prevent this. The decision to have a blood transfusion has been considered carefully by your caregiver before blood is given. Blood is not given unless the benefits outweigh the risks. AFTER THE TRANSFUSION  Right after receiving a blood transfusion, you will usually feel much better and more energetic. This is especially true if your red blood cells have gotten low (anemic). The transfusion raises the level of the red blood cells which carry oxygen, and this usually causes an energy increase.  The nurse administering the transfusion will monitor you carefully for complications. HOME CARE INSTRUCTIONS  No special instructions are needed after a transfusion. You may find your energy is better. Speak with your caregiver about any limitations on activity for underlying diseases you may have. SEEK MEDICAL CARE IF:   Your condition is not improving after your transfusion.  You develop redness or irritation at the intravenous (IV) site. SEEK IMMEDIATE MEDICAL CARE IF:  Any of the following symptoms occur over the next 12 hours:  Shaking chills.  You have a temperature by  mouth above 102 F (38.9 C), not controlled by medicine.  Chest, back, or muscle pain.  People around you feel you are not acting correctly or are confused.  Shortness of breath or difficulty breathing.  Dizziness and fainting.  You get a rash or develop hives.  You have a decrease in urine output.  Your urine turns a dark color or changes to pink, red, or brown. Any of the following symptoms occur over the next 10 days:  You have a temperature by mouth above 102 F (38.9 C), not controlled by medicine.  Shortness of breath.  Weakness after normal activity.  The white part of the eye turns yellow (jaundice).  You have a decrease in the amount of urine or are urinating less often.  Your urine turns a dark color or changes to pink, red, or brown. Document Released: 06/22/2000 Document Revised: 09/17/2011 Document Reviewed: 02/09/2008 [facility] Patient Information 2014 [place], [place].  _______________________________________________________________________

## 2014-05-31 ENCOUNTER — Encounter (HOSPITAL_COMMUNITY)
Admission: RE | Disposition: A | Discharge status: Skilled Nursing Facility | Payer: Self-pay | Source: Ambulatory Visit | Attending: Orthopedic Surgery

## 2014-05-31 ENCOUNTER — Encounter (HOSPITAL_COMMUNITY): Payer: Self-pay

## 2014-05-31 ENCOUNTER — Inpatient Hospital Stay (HOSPITAL_COMMUNITY): Payer: Medicare HMO | Admitting: Anesthesiology

## 2014-05-31 ENCOUNTER — Inpatient Hospital Stay (HOSPITAL_COMMUNITY)
Admission: RE | Admit: 2014-05-31 | Discharge: 2014-06-02 | DRG: 470 | Disposition: A | Discharge status: Skilled Nursing Facility | Payer: Medicare HMO | Source: Ambulatory Visit | Attending: Orthopedic Surgery | Admitting: Orthopedic Surgery

## 2014-05-31 DIAGNOSIS — I10 Essential (primary) hypertension: Secondary | ICD-10-CM | POA: Diagnosis present

## 2014-05-31 DIAGNOSIS — K219 Gastro-esophageal reflux disease without esophagitis: Secondary | ICD-10-CM | POA: Diagnosis present

## 2014-05-31 DIAGNOSIS — M1710 Unilateral primary osteoarthritis, unspecified knee: Secondary | ICD-10-CM | POA: Diagnosis present

## 2014-05-31 DIAGNOSIS — M25561 Pain in right knee: Secondary | ICD-10-CM | POA: Diagnosis present

## 2014-05-31 DIAGNOSIS — E785 Hyperlipidemia, unspecified: Secondary | ICD-10-CM | POA: Diagnosis present

## 2014-05-31 DIAGNOSIS — M179 Osteoarthritis of knee, unspecified: Secondary | ICD-10-CM | POA: Diagnosis present

## 2014-05-31 DIAGNOSIS — M1711 Unilateral primary osteoarthritis, right knee: Secondary | ICD-10-CM | POA: Diagnosis present

## 2014-05-31 DIAGNOSIS — I2510 Atherosclerotic heart disease of native coronary artery without angina pectoris: Secondary | ICD-10-CM | POA: Diagnosis present

## 2014-05-31 HISTORY — PX: TOTAL KNEE ARTHROPLASTY: SHX125

## 2014-05-31 HISTORY — DX: Disorder of the skin and subcutaneous tissue, unspecified: L98.9

## 2014-05-31 LAB — TYPE AND SCREEN
ABO/RH(D): B POS
ANTIBODY SCREEN: NEGATIVE

## 2014-05-31 SURGERY — ARTHROPLASTY, KNEE, TOTAL
Anesthesia: Spinal | Site: Knee | Laterality: Right

## 2014-05-31 MED ORDER — ONDANSETRON HCL 4 MG/2ML IJ SOLN: 4.0000 mg | Freq: Four times a day (QID) | INTRAMUSCULAR | Status: DC | PRN

## 2014-05-31 MED ORDER — MENTHOL 3 MG MT LOZG
1.0000 | LOZENGE | OROMUCOSAL | Status: DC | PRN
  Filled 2014-05-31: qty 9

## 2014-05-31 MED ORDER — DIPHENHYDRAMINE HCL 12.5 MG/5ML PO ELIX: 12.5000 mg | ORAL_SOLUTION | ORAL | Status: DC | PRN

## 2014-05-31 MED ORDER — ACETAMINOPHEN 650 MG RE SUPP: 650.0000 mg | Freq: Four times a day (QID) | RECTAL | Status: DC | PRN

## 2014-05-31 MED ORDER — FINASTERIDE 5 MG PO TABS
5.0000 mg | ORAL_TABLET | Freq: Every day | ORAL | Status: DC
  Administered 2014-06-01 – 2014-06-02 (×2): 5 mg via ORAL
  Filled 2014-05-31 (×2): qty 1

## 2014-05-31 MED ORDER — SODIUM CHLORIDE 0.9 % IJ SOLN
INTRAMUSCULAR | Status: DC | PRN
  Administered 2014-05-31: 30 mL

## 2014-05-31 MED ORDER — SODIUM CHLORIDE 0.9 % IV SOLN
INTRAVENOUS | Status: DC
  Administered 2014-05-31 – 2014-06-01 (×2): via INTRAVENOUS

## 2014-05-31 MED ORDER — ATORVASTATIN CALCIUM 20 MG PO TABS
20.0000 mg | ORAL_TABLET | Freq: Every day | ORAL | Status: DC
  Administered 2014-06-01 – 2014-06-02 (×2): 20 mg via ORAL
  Filled 2014-05-31 (×2): qty 1

## 2014-05-31 MED ORDER — SODIUM CHLORIDE 0.9 % IR SOLN
Status: DC | PRN
  Administered 2014-05-31: 1000 mL

## 2014-05-31 MED ORDER — MIDAZOLAM HCL 2 MG/2ML IJ SOLN
INTRAMUSCULAR | Status: AC
  Filled 2014-05-31: qty 2

## 2014-05-31 MED ORDER — MORPHINE SULFATE 2 MG/ML IJ SOLN
1.0000 mg | INTRAMUSCULAR | Status: DC | PRN
  Administered 2014-05-31: 1 mg via INTRAVENOUS
  Filled 2014-05-31: qty 1

## 2014-05-31 MED ORDER — TRAMADOL HCL 50 MG PO TABS: 50.0000 mg | ORAL_TABLET | Freq: Four times a day (QID) | ORAL | Status: DC | PRN

## 2014-05-31 MED ORDER — LACTATED RINGERS IV SOLN
INTRAVENOUS | Status: DC
  Administered 2014-05-31: 13:00:00 via INTRAVENOUS
  Administered 2014-05-31: 1000 mL via INTRAVENOUS
  Administered 2014-05-31: 14:00:00 via INTRAVENOUS

## 2014-05-31 MED ORDER — DEXAMETHASONE SODIUM PHOSPHATE 10 MG/ML IJ SOLN
10.0000 mg | Freq: Once | INTRAMUSCULAR | Status: AC
  Administered 2014-05-31: 10 mg via INTRAVENOUS

## 2014-05-31 MED ORDER — HYDROMORPHONE HCL 1 MG/ML IJ SOLN: 0.2500 mg | INTRAMUSCULAR | Status: DC | PRN

## 2014-05-31 MED ORDER — TRANEXAMIC ACID 100 MG/ML IV SOLN
2000.0000 mg | Freq: Once | INTRAVENOUS | Status: DC
  Filled 2014-05-31: qty 20

## 2014-05-31 MED ORDER — METOCLOPRAMIDE HCL 5 MG/ML IJ SOLN: 5.0000 mg | Freq: Three times a day (TID) | INTRAMUSCULAR | Status: DC | PRN

## 2014-05-31 MED ORDER — ACETAMINOPHEN 500 MG PO TABS
1000.0000 mg | ORAL_TABLET | Freq: Four times a day (QID) | ORAL | Status: AC
  Administered 2014-05-31 – 2014-06-01 (×4): 1000 mg via ORAL
  Filled 2014-05-31 (×4): qty 2

## 2014-05-31 MED ORDER — BISACODYL 10 MG RE SUPP: 10.0000 mg | Freq: Every day | RECTAL | Status: DC | PRN

## 2014-05-31 MED ORDER — BUPIVACAINE-EPINEPHRINE (PF) 0.25% -1:200000 IJ SOLN
INTRAMUSCULAR | Status: AC
  Filled 2014-05-31: qty 30

## 2014-05-31 MED ORDER — POLYETHYLENE GLYCOL 3350 17 G PO PACK: 17.0000 g | PACK | Freq: Every day | ORAL | Status: DC | PRN

## 2014-05-31 MED ORDER — PANTOPRAZOLE SODIUM 40 MG PO TBEC
40.0000 mg | DELAYED_RELEASE_TABLET | Freq: Every day | ORAL | Status: DC
  Administered 2014-05-31 – 2014-06-01 (×2): 40 mg via ORAL
  Filled 2014-05-31 (×4): qty 1

## 2014-05-31 MED ORDER — BUPIVACAINE LIPOSOME 1.3 % IJ SUSP
INTRAMUSCULAR | Status: DC | PRN
  Administered 2014-05-31: 20 mL

## 2014-05-31 MED ORDER — ACETAMINOPHEN 10 MG/ML IV SOLN
1000.0000 mg | Freq: Once | INTRAVENOUS | Status: AC
  Administered 2014-05-31: 1000 mg via INTRAVENOUS
  Filled 2014-05-31: qty 100

## 2014-05-31 MED ORDER — LACTATED RINGERS IV SOLN: INTRAVENOUS | Status: DC

## 2014-05-31 MED ORDER — SODIUM CHLORIDE 0.9 % IV SOLN: INTRAVENOUS | Status: DC

## 2014-05-31 MED ORDER — ONDANSETRON HCL 4 MG PO TABS: 4.0000 mg | ORAL_TABLET | Freq: Four times a day (QID) | ORAL | Status: DC | PRN

## 2014-05-31 MED ORDER — PROPOFOL 10 MG/ML IV BOLUS
INTRAVENOUS | Status: AC
  Filled 2014-05-31: qty 20

## 2014-05-31 MED ORDER — CEFAZOLIN SODIUM-DEXTROSE 2-3 GM-% IV SOLR
2.0000 g | Freq: Four times a day (QID) | INTRAVENOUS | Status: AC
  Administered 2014-05-31 – 2014-06-01 (×2): 2 g via INTRAVENOUS
  Filled 2014-05-31 (×2): qty 50

## 2014-05-31 MED ORDER — DOXAZOSIN MESYLATE 4 MG PO TABS
4.0000 mg | ORAL_TABLET | Freq: Every morning | ORAL | Status: DC
  Administered 2014-06-01 – 2014-06-02 (×2): 4 mg via ORAL
  Filled 2014-05-31 (×2): qty 1

## 2014-05-31 MED ORDER — SODIUM CHLORIDE 0.9 % IJ SOLN
INTRAMUSCULAR | Status: AC
  Filled 2014-05-31: qty 50

## 2014-05-31 MED ORDER — STERILE WATER FOR IRRIGATION IR SOLN
Status: DC | PRN
  Administered 2014-05-31: 1500 mL

## 2014-05-31 MED ORDER — DEXAMETHASONE SODIUM PHOSPHATE 10 MG/ML IJ SOLN
INTRAMUSCULAR | Status: AC
  Filled 2014-05-31: qty 1

## 2014-05-31 MED ORDER — FLEET ENEMA 7-19 GM/118ML RE ENEM
1.0000 | ENEMA | Freq: Once | RECTAL | Status: AC | PRN
Start: 2014-05-31 — End: 2014-05-31

## 2014-05-31 MED ORDER — METOPROLOL TARTRATE 25 MG PO TABS
25.0000 mg | ORAL_TABLET | Freq: Every morning | ORAL | Status: DC
  Administered 2014-06-01 – 2014-06-02 (×2): 25 mg via ORAL
  Filled 2014-05-31 (×2): qty 1

## 2014-05-31 MED ORDER — EPHEDRINE SULFATE 50 MG/ML IJ SOLN
INTRAMUSCULAR | Status: DC | PRN
  Administered 2014-05-31: 15 mg via INTRAVENOUS

## 2014-05-31 MED ORDER — DIPHENHYDRAMINE HCL 25 MG PO CAPS: 25.0000 mg | ORAL_CAPSULE | Freq: Four times a day (QID) | ORAL | Status: DC | PRN

## 2014-05-31 MED ORDER — DEXAMETHASONE SODIUM PHOSPHATE 10 MG/ML IJ SOLN
10.0000 mg | Freq: Once | INTRAMUSCULAR | Status: AC
  Administered 2014-06-01: 10 mg via INTRAVENOUS
  Filled 2014-05-31: qty 1

## 2014-05-31 MED ORDER — FENTANYL CITRATE 0.05 MG/ML IJ SOLN
INTRAMUSCULAR | Status: DC | PRN
  Administered 2014-05-31: 100 ug via INTRAVENOUS

## 2014-05-31 MED ORDER — MIDAZOLAM HCL 5 MG/5ML IJ SOLN
INTRAMUSCULAR | Status: DC | PRN
  Administered 2014-05-31: 2 mg via INTRAVENOUS

## 2014-05-31 MED ORDER — OXYCODONE HCL 5 MG PO TABS
5.0000 mg | ORAL_TABLET | ORAL | Status: DC | PRN
  Administered 2014-05-31: 5 mg via ORAL
  Administered 2014-05-31 – 2014-06-01 (×5): 10 mg via ORAL
  Administered 2014-06-01: 5 mg via ORAL
  Administered 2014-06-02: 10 mg via ORAL
  Filled 2014-05-31 (×7): qty 2

## 2014-05-31 MED ORDER — PHENOL 1.4 % MT LIQD: 1.0000 | OROMUCOSAL | Status: DC | PRN

## 2014-05-31 MED ORDER — BUPIVACAINE HCL (PF) 0.25 % IJ SOLN
INTRAMUSCULAR | Status: AC
  Filled 2014-05-31: qty 30

## 2014-05-31 MED ORDER — ACETAMINOPHEN 325 MG PO TABS: 650.0000 mg | ORAL_TABLET | Freq: Four times a day (QID) | ORAL | Status: DC | PRN

## 2014-05-31 MED ORDER — METHOCARBAMOL 1000 MG/10ML IJ SOLN
500.0000 mg | Freq: Four times a day (QID) | INTRAVENOUS | Status: DC | PRN
  Administered 2014-05-31: 500 mg via INTRAVENOUS
  Filled 2014-05-31 (×2): qty 5

## 2014-05-31 MED ORDER — DOCUSATE SODIUM 100 MG PO CAPS
100.0000 mg | ORAL_CAPSULE | Freq: Two times a day (BID) | ORAL | Status: DC
  Administered 2014-05-31 – 2014-06-02 (×4): 100 mg via ORAL

## 2014-05-31 MED ORDER — RIVAROXABAN 10 MG PO TABS
10.0000 mg | ORAL_TABLET | Freq: Every day | ORAL | Status: DC
  Administered 2014-06-01 – 2014-06-02 (×2): 10 mg via ORAL
  Filled 2014-05-31 (×3): qty 1

## 2014-05-31 MED ORDER — METHOCARBAMOL 500 MG PO TABS
500.0000 mg | ORAL_TABLET | Freq: Four times a day (QID) | ORAL | Status: DC | PRN
  Administered 2014-06-01 – 2014-06-02 (×4): 500 mg via ORAL
  Filled 2014-05-31 (×4): qty 1

## 2014-05-31 MED ORDER — BUPIVACAINE HCL 0.25 % IJ SOLN
INTRAMUSCULAR | Status: DC | PRN
  Administered 2014-05-31: 30 mL

## 2014-05-31 MED ORDER — 0.9 % SODIUM CHLORIDE (POUR BTL) OPTIME
TOPICAL | Status: DC | PRN
  Administered 2014-05-31: 1000 mL

## 2014-05-31 MED ORDER — SODIUM CHLORIDE 0.9 % IJ SOLN
INTRAMUSCULAR | Status: AC
  Filled 2014-05-31: qty 10

## 2014-05-31 MED ORDER — EPHEDRINE SULFATE 50 MG/ML IJ SOLN
INTRAMUSCULAR | Status: AC
  Filled 2014-05-31: qty 1

## 2014-05-31 MED ORDER — BUPIVACAINE IN DEXTROSE 0.75-8.25 % IT SOLN
INTRATHECAL | Status: DC | PRN
  Administered 2014-05-31: 2 mL via INTRATHECAL

## 2014-05-31 MED ORDER — CHLORHEXIDINE GLUCONATE 4 % EX LIQD: 60.0000 mL | Freq: Once | CUTANEOUS | Status: DC

## 2014-05-31 MED ORDER — CEFAZOLIN SODIUM-DEXTROSE 2-3 GM-% IV SOLR
INTRAVENOUS | Status: AC
  Filled 2014-05-31: qty 50

## 2014-05-31 MED ORDER — CEFAZOLIN SODIUM-DEXTROSE 2-3 GM-% IV SOLR
2.0000 g | INTRAVENOUS | Status: AC
Start: 2014-05-31 — End: 2014-05-31
  Administered 2014-05-31: 2 g via INTRAVENOUS

## 2014-05-31 MED ORDER — PHENYLEPHRINE HCL 10 MG/ML IJ SOLN
INTRAMUSCULAR | Status: DC | PRN
  Administered 2014-05-31: 200 ug via INTRAVENOUS
  Administered 2014-05-31: 120 ug via INTRAVENOUS

## 2014-05-31 MED ORDER — BUPIVACAINE LIPOSOME 1.3 % IJ SUSP
20.0000 mL | Freq: Once | INTRAMUSCULAR | Status: DC
  Filled 2014-05-31: qty 20

## 2014-05-31 MED ORDER — PROPOFOL INFUSION 10 MG/ML OPTIME
INTRAVENOUS | Status: DC | PRN
  Administered 2014-05-31: 140 ug/kg/min via INTRAVENOUS

## 2014-05-31 MED ORDER — FENTANYL CITRATE 0.05 MG/ML IJ SOLN
INTRAMUSCULAR | Status: AC
  Filled 2014-05-31: qty 2

## 2014-05-31 MED ORDER — PHENYLEPHRINE 40 MCG/ML (10ML) SYRINGE FOR IV PUSH (FOR BLOOD PRESSURE SUPPORT)
PREFILLED_SYRINGE | INTRAVENOUS | Status: AC
  Filled 2014-05-31: qty 10

## 2014-05-31 MED ORDER — KETOROLAC TROMETHAMINE 15 MG/ML IJ SOLN: 7.5000 mg | Freq: Four times a day (QID) | INTRAMUSCULAR | Status: AC | PRN

## 2014-05-31 MED ORDER — METOCLOPRAMIDE HCL 10 MG PO TABS: 5.0000 mg | ORAL_TABLET | Freq: Three times a day (TID) | ORAL | Status: DC | PRN

## 2014-05-31 SURGICAL SUPPLY — 65 items
BAG SPEC THK2 15X12 ZIP CLS (MISCELLANEOUS) ×1
BAG ZIPLOCK 12X15 (MISCELLANEOUS) ×3 IMPLANT
BANDAGE ELASTIC 6 VELCRO ST LF (GAUZE/BANDAGES/DRESSINGS) ×3 IMPLANT
BANDAGE ESMARK 6X9 LF (GAUZE/BANDAGES/DRESSINGS) ×1 IMPLANT
BLADE SAG 18X100X1.27 (BLADE) ×3 IMPLANT
BLADE SAW SGTL 11.0X1.19X90.0M (BLADE) ×3 IMPLANT
BNDG CMPR 82X61 PLY HI ABS (GAUZE/BANDAGES/DRESSINGS) ×2
BNDG CMPR 9X6 STRL LF SNTH (GAUZE/BANDAGES/DRESSINGS) ×1
BNDG CONFORM 6X.82 1P STRL (GAUZE/BANDAGES/DRESSINGS) ×4 IMPLANT
BNDG ESMARK 6X9 LF (GAUZE/BANDAGES/DRESSINGS) ×3
BOWL SMART MIX CTS (DISPOSABLE) ×3 IMPLANT
CAPT RP KNEE ×2 IMPLANT
CEMENT HV SMART SET (Cement) ×6 IMPLANT
CLOSURE WOUND 1/2 X4 (GAUZE/BANDAGES/DRESSINGS) ×2
CUFF TOURN SGL QUICK 34 (TOURNIQUET CUFF) ×3
CUFF TRNQT CYL 34X4X40X1 (TOURNIQUET CUFF) ×1 IMPLANT
DECANTER SPIKE VIAL GLASS SM (MISCELLANEOUS) ×3 IMPLANT
DRAPE EXTREMITY T 121X128X90 (DRAPE) ×3 IMPLANT
DRAPE POUCH INSTRU U-SHP 10X18 (DRAPES) ×3 IMPLANT
DRAPE U-SHAPE 47X51 STRL (DRAPES) ×3 IMPLANT
DRSG ADAPTIC 3X8 NADH LF (GAUZE/BANDAGES/DRESSINGS) ×3 IMPLANT
DRSG PAD ABDOMINAL 8X10 ST (GAUZE/BANDAGES/DRESSINGS) ×3 IMPLANT
DURAPREP 26ML APPLICATOR (WOUND CARE) ×3 IMPLANT
ELECT REM PT RETURN 9FT ADLT (ELECTROSURGICAL) ×3
ELECTRODE REM PT RTRN 9FT ADLT (ELECTROSURGICAL) ×1 IMPLANT
EVACUATOR 1/8 PVC DRAIN (DRAIN) ×3 IMPLANT
FACESHIELD WRAPAROUND (MASK) ×15 IMPLANT
FACESHIELD WRAPAROUND OR TEAM (MASK) ×5 IMPLANT
GAUZE SPONGE 4X4 12PLY STRL (GAUZE/BANDAGES/DRESSINGS) ×3 IMPLANT
GLOVE BIO SURGEON STRL SZ7.5 (GLOVE) IMPLANT
GLOVE BIO SURGEON STRL SZ8 (GLOVE) ×3 IMPLANT
GLOVE BIOGEL PI IND STRL 6.5 (GLOVE) IMPLANT
GLOVE BIOGEL PI IND STRL 8 (GLOVE) ×1 IMPLANT
GLOVE BIOGEL PI INDICATOR 6.5 (GLOVE)
GLOVE BIOGEL PI INDICATOR 8 (GLOVE) ×2
GLOVE SURG SS PI 6.5 STRL IVOR (GLOVE) IMPLANT
GOWN STRL REUS W/TWL LRG LVL3 (GOWN DISPOSABLE) ×3 IMPLANT
GOWN STRL REUS W/TWL XL LVL3 (GOWN DISPOSABLE) IMPLANT
HANDPIECE INTERPULSE COAX TIP (DISPOSABLE) ×3
IMMOBILIZER KNEE 20 (SOFTGOODS) ×5 IMPLANT
IMMOBILIZER KNEE 20 THIGH 36 (SOFTGOODS) ×1 IMPLANT
KIT BASIN OR (CUSTOM PROCEDURE TRAY) ×3 IMPLANT
MANIFOLD NEPTUNE II (INSTRUMENTS) ×3 IMPLANT
NDL SAFETY ECLIPSE 18X1.5 (NEEDLE) ×2 IMPLANT
NEEDLE HYPO 18GX1.5 SHARP (NEEDLE) ×6
NS IRRIG 1000ML POUR BTL (IV SOLUTION) ×3 IMPLANT
PACK TOTAL JOINT (CUSTOM PROCEDURE TRAY) ×3 IMPLANT
PAD ABD 8X10 STRL (GAUZE/BANDAGES/DRESSINGS) ×2 IMPLANT
PADDING CAST COTTON 6X4 STRL (CAST SUPPLIES) ×6 IMPLANT
POSITIONER SURGICAL ARM (MISCELLANEOUS) ×3 IMPLANT
SET HNDPC FAN SPRY TIP SCT (DISPOSABLE) ×1 IMPLANT
STRIP CLOSURE SKIN 1/2X4 (GAUZE/BANDAGES/DRESSINGS) ×4 IMPLANT
SUCTION FRAZIER 12FR DISP (SUCTIONS) ×3 IMPLANT
SUT MNCRL AB 4-0 PS2 18 (SUTURE) ×3 IMPLANT
SUT VIC AB 2-0 CT1 27 (SUTURE) ×9
SUT VIC AB 2-0 CT1 TAPERPNT 27 (SUTURE) ×3 IMPLANT
SUT VLOC 180 0 24IN GS25 (SUTURE) ×3 IMPLANT
SYR 20CC LL (SYRINGE) ×3 IMPLANT
SYR 50ML LL SCALE MARK (SYRINGE) ×3 IMPLANT
TAPE STRIPS DRAPE STRL (GAUZE/BANDAGES/DRESSINGS) ×2 IMPLANT
TOWEL OR 17X26 10 PK STRL BLUE (TOWEL DISPOSABLE) ×3 IMPLANT
TOWEL OR NON WOVEN STRL DISP B (DISPOSABLE) IMPLANT
TRAY FOLEY CATH 14FRSI W/METER (CATHETERS) ×3 IMPLANT
WATER STERILE IRR 1500ML POUR (IV SOLUTION) ×3 IMPLANT
WRAP KNEE MAXI GEL POST OP (GAUZE/BANDAGES/DRESSINGS) ×3 IMPLANT

## 2014-05-31 NOTE — Anesthesia Preprocedure Evaluation (Signed)
Anesthesia Evaluation  Patient identified by MRN, date of birth, ID band Patient awake    Reviewed: Allergy & Precautions, H&P , NPO status , Patient's Chart, lab work & pertinent test results, reviewed documented beta blocker date and time   Airway Mallampati: II  TM Distance: >3 FB Neck ROM: full    Dental  (+) Chipped, Dental Advisory Given Small chips front upper:   Pulmonary neg pulmonary ROS, shortness of breath and with exertion,  breath sounds clear to auscultation  Pulmonary exam normal       Cardiovascular hypertension, Pt. on home beta blockers + CAD and + Cardiac Stents Rhythm:regular Rate:Normal     Neuro/Psych negative neurological ROS  negative psych ROS   GI/Hepatic negative GI ROS, Neg liver ROS,   Endo/Other  negative endocrine ROS  Renal/GU negative Renal ROS  negative genitourinary   Musculoskeletal   Abdominal   Peds  Hematology negative hematology ROS (+)   Anesthesia Other Findings   Reproductive/Obstetrics negative OB ROS                             Anesthesia Physical Anesthesia Plan  ASA: III  Anesthesia Plan: Spinal   Post-op Pain Management:    Induction:   Airway Management Planned:   Additional Equipment:   Intra-op Plan:   Post-operative Plan:   Informed Consent: I have reviewed the patients History and Physical, chart, labs and discussed the procedure including the risks, benefits and alternatives for the proposed anesthesia with the patient or authorized representative who has indicated his/her understanding and acceptance.   Dental Advisory Given  Plan Discussed with: CRNA and Surgeon  Anesthesia Plan Comments:         Anesthesia Quick Evaluation

## 2014-05-31 NOTE — Interval H&P Note (Signed)
History and Physical Interval Note:  05/31/2014 12:27 PM  Patrick Knapp  has presented today for surgery, with the diagnosis of Right Knee Osteoarthritis  The various methods of treatment have been discussed with the patient and family. After consideration of risks, benefits and other options for treatment, the patient has consented to  Procedure(s): RIGHT TOTAL KNEE ARTHROPLASTY (Right) as a surgical intervention .  The patient's history has been reviewed, patient examined, no change in status, stable for surgery.  I have reviewed the patient's chart and labs.  Questions were answered to the patient's satisfaction.     Loanne DrillingALUISIO,Kellsie Grindle V

## 2014-05-31 NOTE — H&P (View-Only) (Signed)
Patrick Knapp DOB: [birth year] Married / Language: English / Race: White Male Date of Admission:  05-31-2014 Chief Complaint:  Right Knee Pain History of Present Illness  The patient is a 80 year old male who comes in  for a preoperative History and Physical. The patient is scheduled for a right total knee arthroplasty to be performed by Dr. Gus RankinFrank V. Aluisio, MD at [facility] on 05/31/2014. The patient is a 80 year old male who presents  for follow up of their knee. The patient is being followed for their right knee pain and osteoarthritis. They are now 14 month(s) out from Synvisc series (that provided relief of severe pain). Symptoms reported today include: pain and aching. The following medication has been used for pain control: none. The patient indicates that they have questions or concerns today regarding total knee arthroplasty. He said the injections have not helped at all. He does not have severe pain now but he is unable to do the things he desires. He does have daily pain, including pain at night. This limits what he can and can not do. All of the interventions to date, including cortisone and visco supplements have not been of substantial benefit. He would like to be more active and do the things he desires. The knee is the only thing holding him back at this time. He is ready to get it replaced at this time.  They have been treated conservatively in the past for the above stated problem and despite conservative measures, they continue to have progressive pain and severe functional limitations and dysfunction. They have failed non-operative management including home exercise, medications, and injections. It is felt that they would benefit from undergoing total joint replacement. Risks and benefits of the procedure have been discussed with the patient and they elect to proceed with surgery. There are no active contraindications to surgery such as ongoing infection or rapidly  progressive neurological disease.  Allergies No Known Drug Allergies  Problem List/Past Medical  Primary osteoarthritis of right knee (M17.11) High blood pressure Coronary artery disease History of Compression Fracture  Family History  Heart Disease Mother. mother Congestive Heart Failure mother and brother Cancer Father. father Cerebrovascular Accident Mother. mother  Social History Exercise Exercises weekly; does other Exercises daily; does running / walking Former drinker 02/05/2014: In the past drank wine only occasionally per week Living situation live with spouse Children 5 or more Current work status working part time Marital status married Tobacco / smoke exposure 02/05/2014: no no Tobacco use Never smoker. 02/05/2014 never smoker No history of drug/alcohol rehab Not under pain contract Number of flights of stairs before winded 1 Alcohol use current drinker; only occasionally per week Drug/Alcohol Rehab (Currently) no Drug/Alcohol Rehab (Previously) no Illicit drug use no Pain Contract no  Medication History  Doxazosin Mesylate (8MG  Tablet, Oral) Active. Finasteride (5MG  Tablet, Oral) Active. Tylenol Extra Strength (500MG  Tablet, Oral) Active. Metoprolol Tartrate (50MG  Tablet, Oral) Active. Plavix (75MG  Tablet, Oral daily) Active. Atorvastatin Calcium (20MG  Tablet, Oral) Active. Pantoprazole Sodium (40MG  Tablet DR, Oral) Active. Melatonin (Oral) Specific dose unknown - Active.  Past Surgical History  Arthroscopy of Knee left Heart Stents One stent Cataract Extraction-Left Date: 04/2014.  Review of Systems  General Not Present- Chills, Fatigue, Fever, Memory Loss, Night Sweats, Weight Gain and Weight Loss. Skin Not Present- Eczema, Hives, Itching, Lesions and Rash. HEENT Not Present- Dentures, Double Vision, Headache, Hearing Loss, Tinnitus and Visual Loss. Respiratory Not Present- Allergies, Chronic Cough, Coughing up  blood, Shortness of breath at rest and Shortness of breath with exertion. Cardiovascular Not Present- Chest Pain, Difficulty Breathing Lying Down, Murmur, Palpitations, Racing/skipping heartbeats and Swelling. Gastrointestinal Not Present- Abdominal Pain, Bloody Stool, Constipation, Diarrhea, Difficulty Swallowing, Heartburn, Jaundice, Loss of appetitie, Nausea and Vomiting. Male Genitourinary Not Present- Blood in Urine, Discharge, Flank Pain, Incontinence, Painful Urination, Urgency, Urinary frequency, Urinary Retention, Urinating at Night and Weak urinary stream. Musculoskeletal Not Present- Back Pain, Joint Pain, Joint Swelling, Morning Stiffness, Muscle Pain, Muscle Weakness and Spasms. Neurological Not Present- Blackout spells, Difficulty with balance, Dizziness, Paralysis, Tremor and Weakness. Psychiatric Not Present- Insomnia.   Vitals  Weight: 200 lb Height: 68.5in Height was reported by patient. Body Surface Area: 2.05 m Body Mass Index: 29.97 kg/m  BP: 134/68 (Sitting, Right Arm, Standard)   Physical Exam  General Mental Status -Alert, cooperative and good historian. General Appearance-pleasant, Not in acute distress. Orientation-Oriented X3. Build & Nutrition-Well nourished and Well developed.  Head and Neck Head-normocephalic, atraumatic . Neck Global Assessment - supple, no bruit auscultated on the right, no bruit auscultated on the left.  Eye Pupil - Bilateral-Dilated((just had eye exam on day of H&P for recent cataract surgery)), Regular and Round. Motion - Bilateral-EOMI.  Chest and Lung Exam Auscultation Breath sounds - clear at anterior chest wall and clear at posterior chest wall. Adventitious sounds - No Adventitious sounds.  Cardiovascular Auscultation Rhythm - Regular rate and rhythm. Heart Sounds - S1 WNL and S2 WNL. Murmurs & Other Heart Sounds - Auscultation of the heart reveals - No  Murmurs.  Abdomen Palpation/Percussion Tenderness - Abdomen is non-tender to palpation. Rigidity (guarding) - Abdomen is soft. Auscultation Auscultation of the abdomen reveals - Bowel sounds normal.  Male Genitourinary Note: Not done, not pertinent to present illness   Musculoskeletal Note: He is alert and oriented. No apparent distress. Evaluation of his hips show a normal motion, no discomfort. The right knee shows a varus deformity. Range is about 5-130. There is moderate crepitus on range of motion. He is tender medial greater than lateral. No instability. The left knee shows no effusion. Range is 0-135. No deformity. No tenderness. No instability.  RADIOGRAPHS: AP both knees and lateral show bone on bone arthritis in the medial and patellofemoral compartments of the right knee. The left knee has medial narrowing but not bone on bone.   Assessment & Plan Primary osteoarthritis of right knee (M17.11) Note:Plan is for a Right Total Knee Replacement by Dr. Lequita HaltAluisio.  Plan is to go to rehab after surgery  PCP - Dr. Virgel ManifoldAva Cards - Dr. Melburn PopperNasher - Patient has been seen preoperatively and felt to be stable for surgery. He was instructed to stop the Plavix 7 days prior to surgery.  Topical TXA - CAD, Heart Stenting  Signed electronically by Lauraine RinneAlexzandrew L Perkins, III PA-C

## 2014-05-31 NOTE — Transfer of Care (Signed)
Immediate Anesthesia Transfer of Care Note  Patient: Patrick Knapp  Procedure(s) Performed: Procedure(s): RIGHT TOTAL KNEE ARTHROPLASTY (Right)  Patient Location: PACU  Anesthesia Type:Regional and Spinal  Level of Consciousness: awake, sedated and patient cooperative  Airway & Oxygen Therapy: Patient Spontanous Breathing and Patient connected to face mask oxygen  Post-op Assessment: Report given to PACU RN and Post -op Vital signs reviewed and stable  Post vital signs: Reviewed and stable  Complications: No apparent anesthesia complications

## 2014-05-31 NOTE — Anesthesia Postprocedure Evaluation (Signed)
  Anesthesia Post-op Note  Patient: Patrick Knapp  Procedure(s) Performed: Procedure(s) (LRB): RIGHT TOTAL KNEE ARTHROPLASTY (Right)  Patient Location: PACU  Anesthesia Type: Spinal  Level of Consciousness: awake and alert   Airway and Oxygen Therapy: Patient Spontanous Breathing  Post-op Pain: mild  Post-op Assessment: Post-op Vital signs reviewed, Patient's Cardiovascular Status Stable, Respiratory Function Stable, Patent Airway and No signs of Nausea or vomiting  Last Vitals:  Filed Vitals:   05/31/14 1530  BP:   Pulse: 51  Temp:   Resp: 16    Post-op Vital Signs: stable   Complications: No apparent anesthesia complications

## 2014-05-31 NOTE — Progress Notes (Signed)
Dr Lequita HaltAluisio aware of blister-like areas on right knee. No new orders

## 2014-05-31 NOTE — Op Note (Signed)
Pre-operative diagnosis- Osteoarthritis  Right knee(s)  Post-operative diagnosis- Osteoarthritis Right knee(s)  Procedure-  Right  Total Knee Arthroplasty  Surgeon- Gus RankinFrank V. Nishka Heide, MD  Assistant- Avel Peacerew Perkins, PA-C   Anesthesia-  Spinal  EBL-* No blood loss amount entered *   Drains Hemovac  Tourniquet time-  Total Tourniquet Time Documented: Thigh (Right) - 34 minutes Total: Thigh (Right) - 34 minutes     Complications- None  Condition-PACU - hemodynamically stable.   Brief Clinical Note  Patrick Knapp is a 80 y.o. year old male with end stage OA of his right knee with progressively worsening pain and dysfunction. He has constant pain, with activity and at rest and significant functional deficits with difficulties even with ADLs. He has had extensive non-op management including analgesics, injections of cortisone and viscosupplements, and home exercise program, but remains in significant pain with significant dysfunction. Radiographs show bone on bone arthritis medial and patellofemoral. He presents now for right Total Knee Arthroplasty.    Procedure in detail---   The patient is brought into the operating room and positioned supine on the operating table. After successful administration of  Spinal,   a tourniquet is placed high on the  Right thigh(s) and the lower extremity is prepped and draped in the usual sterile fashion. Time out is performed by the operating team and then the  Right lower extremity is wrapped in Esmarch, knee flexed and the tourniquet inflated to 300 mmHg.       A midline incision is made with a ten blade through the subcutaneous tissue to the level of the extensor mechanism. A fresh blade is used to make a medial parapatellar arthrotomy. Soft tissue over the proximal medial tibia is subperiosteally elevated to the joint line with a knife and into the semimembranosus bursa with a Cobb elevator. Soft tissue over the proximal lateral tibia is elevated with  attention being paid to avoiding the patellar tendon on the tibial tubercle. The patella is everted, knee flexed 90 degrees and the ACL and PCL are removed. Findings are bone on bone medial and patellofemoral with large medial osteophytes.        The drill is used to create a starting hole in the distal femur and the canal is thoroughly irrigated with sterile saline to remove the fatty contents. The 5 degree Right  valgus alignment guide is placed into the femoral canal and the distal femoral cutting block is pinned to remove 10 mm off the distal femur. Resection is made with an oscillating saw.      The tibia is subluxed forward and the menisci are removed. The extramedullary alignment guide is placed referencing proximally at the medial aspect of the tibial tubercle and distally along the second metatarsal axis and tibial crest. The block is pinned to remove 2mm off the more deficient medial  side. Resection is made with an oscillating saw. Size 4is the most appropriate size for the tibia and the proximal tibia is prepared with the modular drill and keel punch for that size.      The femoral sizing guide is placed and size 5 is most appropriate. Rotation is marked off the epicondylar axis and confirmed by creating a rectangular flexion gap at 90 degrees. The size 5 cutting block is pinned in this rotation and the anterior, posterior and chamfer cuts are made with the oscillating saw. The intercondylar block is then placed and that cut is made.      Trial size 4 tibial component, trial  size 5 posterior stabilized femur and a 10  mm posterior stabilized rotating platform insert trial is placed. Full extension is achieved with excellent varus/valgus and anterior/posterior balance throughout full range of motion. The patella is everted and thickness measured to be 25  mm. Free hand resection is taken to 14 mm, a 41 template is placed, lug holes are drilled, trial patella is placed, and it tracks normally.  Osteophytes are removed off the posterior femur with the trial in place. All trials are removed and the cut bone surfaces prepared with pulsatile lavage. Cement is mixed and once ready for implantation, the size 4 tibial implant, size  5 posterior stabilized femoral component, and the size 41 patella are cemented in place and the patella is held with the clamp. The trial insert is placed and the knee held in full extension. The Exparel (20 ml mixed with 30 ml saline) and .25% Bupivicaine, are injected into the extensor mechanism, posterior capsule, medial and lateral gutters and subcutaneous tissues.  All extruded cement is removed and once the cement is hard the permanent 10 mm posterior stabilized rotating platform insert is placed into the tibial tray.      The wound is copiously irrigated with saline solution and the extensor mechanism closed over a hemovac drain with #1 V-loc suture. The tourniquet is released for a total tourniquet time of 34  minutes. Flexion against gravity is 140 degrees and the patella tracks normally. Subcutaneous tissue is closed with 2.0 vicryl and subcuticular with running 4.0 Monocryl. The incision is cleaned and dried and steri-strips and a bulky sterile dressing are applied. The limb is placed into a knee immobilizer and the patient is awakened and transported to recovery in stable condition.      Please note that a surgical assistant was a medical necessity for this procedure in order to perform it in a safe and expeditious manner. Surgical assistant was necessary to retract the ligaments and vital neurovascular structures to prevent injury to them and also necessary for proper positioning of the limb to allow for anatomic placement of the prosthesis.   Gus RankinFrank V. [name], MD    05/31/2014, 1:41 PM

## 2014-05-31 NOTE — Anesthesia Procedure Notes (Signed)
Spinal Patient location during procedure: OR Start time: 05/31/2014 12:35 PM End time: 05/31/2014 12:42 PM Staffing Anesthesiologist: Peyton Najjar Resident/CRNA: Lollie Sails Performed by: anesthesiologist and resident/CRNA  Preanesthetic Checklist Completed: patient identified, site marked, surgical consent, pre-op evaluation, timeout performed, IV checked, risks and benefits discussed and monitors and equipment checked Spinal Block Patient position: sitting Prep: Betadine Patient monitoring: heart rate, continuous pulse ox and blood pressure Approach: right paramedian Location: L3-4 Injection technique: single-shot Needle Needle type: Spinocan  Needle gauge: 22 G Needle length: 9 cm Additional Notes Expiration date of kit checked and confirmed. Patient tolerated procedure well, without complications.  First attempt midline by CRNA;  Second attempt by MDA Right paramedian.  Patient tolerated well.

## 2014-05-31 NOTE — Interval H&P Note (Signed)
History and Physical Interval Note:  05/31/2014 11:48 AM  Patrick Knapp  has presented today for surgery, with the diagnosis of Right Knee Osteoarthritis  The various methods of treatment have been discussed with the patient and family. After consideration of risks, benefits and other options for treatment, the patient has consented to  Procedure(s): RIGHT TOTAL KNEE ARTHROPLASTY (Right) as a surgical intervention .  The patient's history has been reviewed, patient examined, no change in status, stable for surgery.  I have reviewed the patient's chart and labs.  Questions were answered to the patient's satisfaction.     Loanne DrillingALUISIO,Ambriel Gorelick V

## 2014-05-31 NOTE — Progress Notes (Signed)
Utilization review completed.  

## 2014-05-31 NOTE — Progress Notes (Signed)
[facility] some blister-like skin disorders over multiple areas of body. States he hasn't noticed them and hasn't been itching. Right lateral knee has several 1/4 to 1/2 in areas noted. Denied itching until after CHG prep done. No increase in areas or redness. Knee washed w warm soapy area and itching subsided. Will notify Dr Lequita HaltAluisio

## 2014-05-31 NOTE — Plan of Care (Signed)
Problem: Phase I Progression Outcomes Goal: CMS/Neurovascular status WDL Outcome: Completed/Met Date Met:  05/31/14 Goal: Pain controlled with appropriate interventions Outcome: Completed/Met Date Met:  05/31/14 Goal: Dangle or out of bed evening of surgery Outcome: Completed/Met Date Met:  05/31/14 Goal: Initial discharge plan identified Outcome: Completed/Met Date Met:  05/31/14 Goal: Hemodynamically stable Outcome: Completed/Met Date Met:  05/31/14 Goal: Other Phase I Outcomes/Goals Outcome: Completed/Met Date Met:  05/31/14

## 2014-06-01 ENCOUNTER — Encounter (HOSPITAL_COMMUNITY): Payer: Self-pay | Admitting: Orthopedic Surgery

## 2014-06-01 LAB — CBC
HEMATOCRIT: 36.4 % — AB (ref 39.0–52.0)
Hemoglobin: 12.2 g/dL — ABNORMAL LOW (ref 13.0–17.0)
MCH: 28.2 pg (ref 26.0–34.0)
MCHC: 33.5 g/dL (ref 30.0–36.0)
MCV: 84.3 fL (ref 78.0–100.0)
Platelets: 142 10*3/uL — ABNORMAL LOW (ref 150–400)
RBC: 4.32 MIL/uL (ref 4.22–5.81)
RDW: 13.1 % (ref 11.5–15.5)
WBC: 11.3 10*3/uL — ABNORMAL HIGH (ref 4.0–10.5)

## 2014-06-01 LAB — BASIC METABOLIC PANEL
Anion gap: 15 (ref 5–15)
BUN: 22 mg/dL (ref 6–23)
CHLORIDE: 106 meq/L (ref 96–112)
CO2: 20 meq/L (ref 19–32)
Calcium: 8.8 mg/dL (ref 8.4–10.5)
Creatinine, Ser: 1.24 mg/dL (ref 0.50–1.35)
GFR calc Af Amer: 60 mL/min — ABNORMAL LOW (ref >90)
GFR, EST NON AFRICAN AMERICAN: 52 mL/min — AB (ref >90)
GLUCOSE: 135 mg/dL — AB (ref 70–99)
Potassium: 3.9 mEq/L (ref 3.7–5.3)
Sodium: 141 mEq/L (ref 137–147)

## 2014-06-01 MED ORDER — ACETAMINOPHEN 325 MG PO TABS: 650.0000 mg | ORAL_TABLET | Freq: Four times a day (QID) | ORAL | Status: DC | PRN

## 2014-06-01 MED ORDER — METHOCARBAMOL 500 MG PO TABS: 500.0000 mg | ORAL_TABLET | Freq: Four times a day (QID) | ORAL | Status: DC | PRN

## 2014-06-01 MED ORDER — METOCLOPRAMIDE HCL 5 MG PO TABS: 5.0000 mg | ORAL_TABLET | Freq: Three times a day (TID) | ORAL | Status: DC | PRN

## 2014-06-01 MED ORDER — ALPRAZOLAM 0.25 MG PO TABS
0.2500 mg | ORAL_TABLET | Freq: Two times a day (BID) | ORAL | Status: DC | PRN
  Administered 2014-06-01: 0.25 mg via ORAL
  Filled 2014-06-01: qty 1

## 2014-06-01 MED ORDER — DSS 100 MG PO CAPS: 100.0000 mg | ORAL_CAPSULE | Freq: Two times a day (BID) | ORAL | Status: DC

## 2014-06-01 MED ORDER — TRAMADOL HCL 50 MG PO TABS: 50.0000 mg | ORAL_TABLET | Freq: Four times a day (QID) | ORAL | Status: DC | PRN

## 2014-06-01 MED ORDER — BISACODYL 10 MG RE SUPP: 10.0000 mg | Freq: Every day | RECTAL | Status: DC | PRN

## 2014-06-01 MED ORDER — POLYETHYLENE GLYCOL 3350 17 G PO PACK: 17.0000 g | PACK | Freq: Every day | ORAL | Status: DC | PRN

## 2014-06-01 MED ORDER — RIVAROXABAN 10 MG PO TABS: 10.0000 mg | ORAL_TABLET | Freq: Every day | ORAL | Status: DC

## 2014-06-01 MED ORDER — OXYCODONE HCL 5 MG PO TABS: 5.0000 mg | ORAL_TABLET | ORAL | Status: DC | PRN

## 2014-06-01 MED ORDER — ONDANSETRON HCL 4 MG PO TABS: 4.0000 mg | ORAL_TABLET | Freq: Four times a day (QID) | ORAL | Status: DC | PRN

## 2014-06-01 NOTE — Progress Notes (Signed)
Subjective: 1 Day Post-Op Procedure(s) (LRB): RIGHT TOTAL KNEE ARTHROPLASTY (Right) Patient reports no pain at this time on rounds when seen by Dr. Lequita HaltAluisio. Patient seen in rounds with Dr. Lequita HaltAluisio.  Patient sitting up on side of bed eating breakfast. He doing okay this morning. Patient is well, and has had no acute complaints or problems We will start therapy today. He is already doing SLR's [facility] stay.  The wife approached the nurses station after Dr. Lequita HaltAluisio left.  She states that her husband has ADHD and the she and her daughter are requesting that the patient be placed onto something like Xanax for his condition.  He has never been prescribed that medication but has taken the med before from a "shared Rx" of the family.  The wife states that he has been treated before with medication for the ADHD in the past but she does not recall the medication.  There is no documentation of the diagnosis in the hospital record or our H&P from the office.  Will add a low dose Xanax PRN for nerves while here in the hospital but will have to see his PCP for further treatment of the condition on an outpatient basis.  Objective: Vital signs in last 24 hours: Temp:  [95 F (35 C)-98.4 F (36.9 C)] 98.4 F (36.9 C) (11/24 0650) Pulse Rate:  [51-83] 61 (11/24 0650) Resp:  [9-21] 16 (11/24 0650) BP: (119-172)/(51-98) 139/59 mmHg (11/24 0650) SpO2:  [93 %-100 %] 93 % (11/24 0650) Weight:  [90.719 kg (200 lb)] 90.719 kg (200 lb) (11/23 0944)  Intake/Output from previous day:  Intake/Output Summary (Last 24 hours) at 06/01/14 0851 Last data filed at 06/01/14 0653  Gross per 24 hour  Intake   4460 ml  Output   2485 ml  Net   1975 ml    Labs:  Recent Labs  06/01/14 0435  HGB 12.2*    Recent Labs  06/01/14 0435  WBC 11.3*  RBC 4.32  HCT 36.4*  PLT 142*    Recent Labs  06/01/14 0435  NA 141  K 3.9  CL 106  CO2 20  BUN 22  CREATININE 1.24    GLUCOSE 135*  CALCIUM 8.8   No results for input(s): LABPT, INR in the last 72 hours.  EXAM General - Patient is Alert, Appropriate and Oriented Extremity - Neurovascular intact Sensation intact distally Dorsiflexion/Plantar flexion intact Dressing - dressing C/D/I Motor Function - intact, moving foot and toes well on exam.  Hemovac pulled without difficulty.  Past Medical History  Diagnosis Date  . Coronary artery disease     post PTCA and stenting of his left circumflex artery in 12/08  . Hypertension   . Hyperlipidemia   . Ischemia   . Shortness of breath   . Enlarged prostate   . GERD (gastroesophageal reflux disease)   . Arthritis   . Skin disorder     Occurs at times as rash, whelps, blister-like areas  with itching sometimes-has occurred for years , but can not find an exact dx or cause    Assessment/Plan: 1 Day Post-Op Procedure(s) (LRB): RIGHT TOTAL KNEE ARTHROPLASTY (Right) Principal Problem:   OA (osteoarthritis) of knee  Estimated body mass index is 30.42 kg/(m^2) as calculated from the following:   Height as of this encounter: 5\' 8"  (1.727 m).   Weight as of this encounter: 90.719 kg (200 lb). Up with therapy Discharge to SNF  DVT Prophylaxis -  Xarelto, Plavix on hold for now. Weight-Bearing as tolerated to right leg D/C O2 and Pulse OX and try on Room Air  Avel Peacerew Perkins, PA-C Orthopaedic Surgery 06/01/2014, 8:51 AM

## 2014-06-01 NOTE — Evaluation (Signed)
Physical Therapy Evaluation Patient Details Name: Patrick LopesCarmelo Knapp MRN: [id number] DOB: 1928/05/08 Today's Date: 06/01/2014   History of Present Illness  RTKA  Clinical Impression  Patient progressing well. Pt will benefit from PT to address problems listed in note below.     Follow Up Recommendations SNF;Supervision/Assistance - 24 hour    Equipment Recommendations  Rolling walker with 5" wheels    Recommendations for Other Services       Precautions / Restrictions Precautions Precautions: Knee;Fall Required Braces or Orthoses: Knee Immobilizer - Right Knee Immobilizer - Right: Discontinue once straight leg raise with < 10 degree lag Restrictions Weight Bearing Restrictions: No      Mobility  Bed Mobility Overal bed mobility: Needs Assistance Bed Mobility: Supine to Sit     Supine to sit: Min assist     General bed mobility comments: support of R leg to the floor  Transfers Overall transfer level: Needs assistance Equipment used: Rolling walker (2 wheeled) Transfers: Sit to/from Stand Sit to Stand: Min assist;From elevated surface         General transfer comment: cues for safety, hand placement, pt  attmpted to sit  to recliner before turning  around and getting close enough.  Ambulation/Gait Ambulation/Gait assistance: Min assist Ambulation Distance (Feet): 50 Feet Assistive device: Rolling walker (2 wheeled) Gait Pattern/deviations: Step-to pattern;Antalgic;Decreased step length - right;Decreased stance time - right Gait velocity: slow   General Gait Details: cues for sequence and posture, step length  Stairs            Wheelchair Mobility    Modified Rankin (Stroke Patients Only)       Balance                                             Pertinent Vitals/Pain Pain Assessment: 0-10 Pain Score: 8  Pain Location: R knee while walking Pain Descriptors / Indicators: Aching;Tightness;Discomfort;Grimacing Pain  Intervention(s): Limited activity within patient's tolerance;Monitored during session;Premedicated before session;Ice applied    Home Living Family/patient expects to be discharged to:: Skilled nursing facility Living Arrangements: Spouse/significant other                    Prior Function Level of Independence: Independent               Hand Dominance        Extremity/Trunk Assessment               Lower Extremity Assessment: RLE deficits/detail RLE Deficits / Details: performs SLR       Communication   Communication: No difficulties  Cognition Arousal/Alertness: Awake/alert Behavior During Therapy: WFL for tasks assessed/performed Overall Cognitive Status: Within Functional Limits for tasks assessed                      General Comments      Exercises Total Joint Exercises Quad Sets: AROM;Both;10 reps;Supine Towel Squeeze: AROM;Both;10 reps;Supine Short Arc Quad: AROM;Right;10 reps;Supine Heel Slides: AAROM;Right;10 reps;Supine Hip ABduction/ADduction: AROM;Right;10 reps;Supine Straight Leg Raises: AAROM;Right;15 reps;Supine Goniometric ROM: 10-60 R knee      Assessment/Plan    PT Assessment Patient needs continued PT services  PT Diagnosis Difficulty walking;Acute pain   PT Problem List Decreased strength;Decreased range of motion;Decreased activity tolerance;Decreased mobility;Decreased knowledge of precautions;Decreased safety awareness;Decreased knowledge of use of DME;Pain  PT Treatment Interventions DME instruction;Gait training;Functional  mobility training;Therapeutic activities;Therapeutic exercise;Patient/family education   PT Goals (Current goals can be found in the Care Plan section) Acute Rehab PT Goals Patient Stated Goal: to go to rehab  and back to work PT Goal Formulation: With patient Time For Goal Achievement: 06/08/14 Potential to Achieve Goals: Good    Frequency 7X/week   Barriers to discharge Decreased  caregiver support      Co-evaluation               End of Session   Activity Tolerance: Patient limited by pain Patient left: in chair (with OT) Nurse Communication: Mobility status         Time: [id number] PT Time Calculation (min) (ACUTE ONLY): 32 min   Charges:   PT Evaluation $Initial PT Evaluation Tier I: 1 Procedure PT Treatments $Gait Training: 8-22 mins $Therapeutic Exercise: 8-22 mins   PT G Codes:          [name], [name] 06/01/2014, 11:30 AM Blanchard KelchKaren Alisia Vanengen PT [phone]

## 2014-06-01 NOTE — Progress Notes (Signed)
Clinical Social Work Department CLINICAL SOCIAL WORK PLACEMENT NOTE 06/01/2014  Patient:  Georgia LopesCAMBARERI,Devondre  Account Number:  [id number] Admit date:  05/31/2014  Clinical Social Worker:  Cori RazorJAMIE Baylyn Sickles, LCSW  Date/time:  06/01/2014 09:41 AM  Clinical Social Work is seeking post-discharge placement for this patient at the following level of care:   SKILLED NURSING   (*CSW will update this form in Epic as items are completed)     Patient/family provided with [facility] of [facility] Work's list of facilities offering this level of care within the geographic area requested by the patient (or if unable, by the patient's family).  06/01/2014  Patient/family informed of their freedom to choose among providers that offer the needed level of care, that participate in Medicare, Medicaid or managed care program needed by the patient, have an available bed and are willing to accept the patient.    Patient/family informed of [facility]' ownership interest in [facility], as well as of the fact that they are under no obligation to receive care at this facility.  PASARR submitted to EDS on 05/31/2014 PASARR number received on 05/31/2014  FL2 transmitted to all facilities in geographic area requested by pt/family on  06/01/2014 FL2 transmitted to all facilities within larger geographic area on   Patient informed that his/her managed care company has contracts with or will negotiate with  certain facilities, including the following:     Patient/family informed of bed offers received:  06/01/2014 [facility] Physician recommends and patient chooses bed at    Patient to be transferred to  on   Patient to be transferred to facility by  Patient and family notified of transfer on  Name of family member notified:    The following physician request were entered in Epic:   Additional Comments:  Cori RazorJamie Tenlee Wollin LCSW [phone]

## 2014-06-01 NOTE — Progress Notes (Signed)
Physical Therapy Treatment Patient Details Name: Patrick LopesCarmelo Knapp MRN: [id number] DOB: January 29, 1928 Today's Date: 06/01/2014    History of Present Illness RTKA    PT Comments    Patient slowly improving, did better with KI on R.  Follow Up Recommendations  SNF;Supervision/Assistance - 24 hour     Equipment Recommendations  Rolling walker with 5" wheels    Recommendations for Other Services       Precautions / Restrictions Precautions Precautions: Knee;Fall Required Braces or Orthoses: Knee Immobilizer - Right Knee Immobilizer - Right: Discontinue once straight leg raise with < 10 degree lag Restrictions Weight Bearing Restrictions: No    Mobility  Bed Mobility Overal bed mobility: Needs Assistance Bed Mobility: Sit to Supine;Supine to Sit     Supine to sit: Min assist Sit to supine: Min assist   General bed mobility comments: support R leg off and onto bed  Transfers Overall transfer level: Needs assistance Equipment used: Rolling walker (2 wheeled) Transfers: Sit to/from Stand Sit to Stand: Min assist;From elevated surface         General transfer comment: cues for safety, hand placement, pt  attmpted to sit  to recliner before turning  around and getting close enough.  Ambulation/Gait Ambulation/Gait assistance: Min assist Ambulation Distance (Feet): 80 Feet Assistive device: Rolling walker (2 wheeled) Gait Pattern/deviations: Step-to pattern;Decreased step length - right;Decreased stance time - right;Antalgic Gait velocity: slow   General Gait Details: cues for sequence and posture, step length   Stairs            Wheelchair Mobility    Modified Rankin (Stroke Patients Only)       Balance                                    Cognition Arousal/Alertness: Awake/alert Behavior During Therapy: Impulsive Overall Cognitive Status: Within Functional Limits for tasks assessed                      Exercises Total  Joint Exercises Quad Sets: AROM;Both;10 reps;Supine Towel Squeeze: AROM;Both;10 reps;Supine Short Arc Quad: AROM;Right;10 reps;Supine Heel Slides: AAROM;Right;10 reps;Supine Hip ABduction/ADduction: AROM;Right;10 reps;Supine Straight Leg Raises: AAROM;Right;15 reps;Supine Goniometric ROM: 10-60 R knee    General Comments        Pertinent Vitals/Pain Pain Assessment: 0-10 Pain Score: 7  Pain Location: , thigh is very sore Pain Descriptors / Indicators: Aching;Sore Pain Intervention(s): Monitored during session;Patient requesting pain meds-RN notified;Heat applied    Home Living Family/patient expects to be discharged to:: Skilled nursing facility Living Arrangements: Spouse/significant other                  Prior Function Level of Independence: Independent          PT Goals (current goals can now be found in the care plan section) Acute Rehab PT Goals Patient Stated Goal: to go to rehab  and back to work PT Goal Formulation: With patient Time For Goal Achievement: 06/08/14 Potential to Achieve Goals: Good Progress towards PT goals: Progressing toward goals    Frequency  7X/week    PT Plan Current plan remains appropriate    Co-evaluation             End of Session Equipment Utilized During Treatment: Right knee immobilizer Activity Tolerance: Patient tolerated treatment well Patient left: in bed;with call bell/phone within reach;with nursing/sitter in room     Time: [phone]  PT Time Calculation (min) (ACUTE ONLY): 19 min  Charges:  $Gait Training: 8-22 mins $Therapeutic Exercise: 8-22 mins                    G Codes:      [name], Shoshanah Dapper Elizabeth 06/01/2014, 3:03 PM

## 2014-06-01 NOTE — Discharge Instructions (Addendum)
° °Dr. Frank Aluisio °Total Joint Specialist °[facility] °[address] °[place], [place] [address] °[phone] ° °TOTAL KNEE REPLACEMENT POSTOPERATIVE DIRECTIONS ° ° ° °Knee Rehabilitation, Guidelines Following Surgery  °Results after knee surgery are often greatly improved when you follow the exercise, range of motion and muscle strengthening exercises prescribed by your doctor. Safety measures are also important to protect the knee from further injury. Any time any of these exercises cause you to have increased pain or swelling in your knee joint, decrease the amount until you are comfortable again and slowly increase them. If you have problems or questions, call your caregiver or physical therapist for advice.  ° °HOME CARE INSTRUCTIONS  °Remove items at home which could result in a fall. This includes throw rugs or furniture in walking pathways.  °Continue medications as instructed at time of discharge. °You may have some home medications which will be placed on hold until you complete the course of blood thinner medication.  °You may start showering once you are discharged home but do not submerge the incision under water. Just pat the incision dry and apply a dry gauze dressing on daily. °Walk with walker as instructed.  °You may resume a sexual relationship in one month or when given the OK by  your doctor.  °· Use walker as long as suggested by your caregivers. °· Avoid periods of inactivity such as sitting longer than an hour when not asleep. This helps prevent blood clots.  °You may put full weight on your legs and walk as much as is comfortable.  °You may return to work once you are cleared by your doctor.  °Do not drive a car for 6 weeks or until released by you surgeon.  °· Do not drive while taking narcotics.  °Wear the elastic stockings for three weeks following surgery during the day but you may remove then at night. °Make sure you keep all of your appointments after your  operation with all of your doctors and caregivers. You should call the office at the above phone number and make an appointment for approximately two weeks after the date of your surgery. °Change the dressing daily and reapply a dry dressing each time. °Please pick up a stool softener and laxative for home use as long as you are requiring pain medications. °· ICE to the affected knee every three hours for 30 minutes at a time and then as needed for pain and swelling.  Continue to use ice on the knee for pain and swelling from surgery. You may notice swelling that will progress down to the foot and ankle.  This is normal after surgery.  Elevate the leg when you are not up walking on it.   °It is important for you to complete the blood thinner medication as prescribed by your doctor. °· Continue to use the breathing machine which will help keep your temperature down.  It is common for your temperature to cycle up and down following surgery, especially at night when you are not up moving around and exerting yourself.  The breathing machine keeps your lungs expanded and your temperature down. ° °RANGE OF MOTION AND STRENGTHENING EXERCISES  °Rehabilitation of the knee is important following a knee injury or an operation. After just a few days of immobilization, the muscles of the thigh which control the knee become weakened and shrink (atrophy). Knee exercises are designed to build up the tone and strength of the thigh muscles and to improve knee   motion. Often times heat used for twenty to thirty minutes before working out will loosen up your tissues and help with improving the range of motion but do not use heat for the first two weeks following surgery. These exercises can be done on a training (exercise) mat, on the floor, on a table or on a bed. Use what ever works the best and is most comfortable for you Knee exercises include:  Leg Lifts - While your knee is still immobilized in a splint or cast, you can do  straight leg raises. Lift the leg to 60 degrees, hold for 3 sec, and slowly lower the leg. Repeat 10-20 times 2-3 times daily. Perform this exercise against resistance later as your knee gets better.  Quad and Hamstring Sets - Tighten up the muscle on the front of the thigh (Quad) and hold for 5-10 sec. Repeat this 10-20 times hourly. Hamstring sets are done by pushing the foot backward against an object and holding for 5-10 sec. Repeat as with quad sets.  A rehabilitation program following serious knee injuries can speed recovery and prevent re-injury in the future due to weakened muscles. Contact your doctor or a physical therapist for more information on knee rehabilitation.   SKILLED REHAB INSTRUCTIONS: If the patient is transferred to a skilled rehab facility following release from the hospital, a list of the current medications will be sent to the facility for the patient to continue.  When discharged from the skilled rehab facility, please have the facility set up the patient's Home Health Physical Therapy prior to being released. Also, the skilled facility will be responsible for providing the patient with their medications at time of release from the facility to include their pain medication, the muscle relaxants, and their blood thinner medication. If the patient is still at the rehab facility at time of the two week follow up appointment, the skilled rehab facility will also need to assist the patient in arranging follow up appointment in our office and any transportation needs.  MAKE SURE YOU:  Understand these instructions.  Will watch your condition.  Will get help right away if you are not doing well or get worse.    Pick up stool softner and laxative for home. Do not submerge incision under water. May shower. Continue to use ice for pain and swelling from surgery.  Take Xarelto for two and a half more weeks, then discontinue Xarelto. Once the patient has completed the Xarelto, they  may resume the Plavix 75 mg daily.  When discharged from the skilled rehab facility, please have the facility set up the patient's Home Health Physical Therapy prior to being released.   Also provide the patient with their medications at time of release from the facility to include their pain medication, the muscle relaxants, and their blood thinner medication.  If the patient is still at the rehab facility at time of follow up appointment, please also assist the patient in arranging follow up appointment in our office and any transportation needs. ICE to the affected knee or hip every three hours for 30 minutes at a time and then as needed for pain and swelling.   Postoperative Constipation Protocol  Constipation - defined medically as fewer than three stools per week and severe constipation as less than one stool per week.  One of the most common issues patients have following surgery is constipation.  Even if you have a regular bowel pattern at home, your normal regimen is likely to be  disrupted due to multiple reasons following surgery.  Combination of anesthesia, postoperative narcotics, change in appetite and fluid intake all can affect your bowels.  In order to avoid complications following surgery, here are some recommendations in order to help you during your recovery period.  Colace (docusate) - Pick up an over-the-counter form of Colace or another stool softener and take twice a day as long as you are requiring postoperative pain medications.  Take with a full glass of water daily.  If you experience loose stools or diarrhea, hold the colace until you stool forms back up.  If your symptoms do not get better within 1 week or if they get worse, check with your doctor.  Dulcolax (bisacodyl) - Pick up over-the-counter and take as directed by the product packaging as needed to assist with the movement of your bowels.  Take with a full glass of water.  Use this product as needed if not relieved  by Colace only.   MiraLax (polyethylene glycol) - Pick up over-the-counter to have on hand.  MiraLax is a solution that will increase the amount of water in your bowels to assist with bowel movements.  Take as directed and can mix with a glass of water, juice, soda, coffee, or tea.  Take if you go more than two days without a movement. Do not use MiraLax more than once per day. Call your doctor if you are still constipated or irregular after using this medication for 7 days in a row.  If you continue to have problems with postoperative constipation, please contact the office for further assistance and recommendations.  If you experience "the worst abdominal pain ever" or develop nausea or vomiting, please contact the office immediatly for further recommendations for treatment.    Information on my medicine - XARELTO (Rivaroxaban)  This medication education was reviewed with me or my healthcare representative as part of my discharge preparation.  The pharmacist that spoke with me during my hospital stay was:  Maurice MarchJackson, Rachel E, [facility] Hospital[facility]PH  Why was Xarelto prescribed for you? Xarelto was prescribed for you to reduce the risk of blood clots forming after orthopedic surgery. The medical term for these abnormal blood clots is venous thromboembolism (VTE).  What do you need to know about xarelto ? Take your Xarelto ONCE DAILY at the same time every day. You may take it either with or without food.  If you have difficulty swallowing the tablet whole, you may crush it and mix in applesauce just prior to taking your dose.  Take Xarelto exactly as prescribed by your doctor and DO NOT stop taking Xarelto without talking to the doctor who prescribed the medication.  Stopping without other VTE prevention medication to take the place of Xarelto may increase your risk of developing a clot.  After discharge, you should have regular check-up appointments with your healthcare provider that is prescribing your  Xarelto.    What do you do if you miss a dose? If you miss a dose, take it as soon as you remember on the same day then continue your regularly scheduled once daily regimen the next day. Do not take two doses of Xarelto on the same day.   Important Safety Information A possible side effect of Xarelto is bleeding. You should call your healthcare provider right away if you experience any of the following: ? Bleeding from an injury or your nose that does not stop. ? Unusual colored urine (red or dark brown) or unusual colored stools (red  or black). ? Unusual bruising for unknown reasons. ? A serious fall or if you hit your head (even if there is no bleeding).  Some medicines may interact with Xarelto and might increase your risk of bleeding while on Xarelto. To help avoid this, consult your healthcare provider or pharmacist prior to using any new prescription or non-prescription medications, including herbals, vitamins, non-steroidal anti-inflammatory drugs (NSAIDs) and supplements.  This website has more information on Xarelto: [email or web address]

## 2014-06-01 NOTE — Care Management Note (Signed)
    Page 1 of 1   06/01/2014     3:20:24 PM CARE MANAGEMENT NOTE 06/01/2014  Patient:  Georgia LopesCAMBARERI,Adolphe   Account Number:  [id number]  Date Initiated:  06/01/2014  Documentation initiated by:  [facility] Center[facility]FFRIES,Shalona Harbour  Subjective/Objective Assessment:   adm: RIGHT TOTAL KNEE ARTHROPLASTY (Right)     Action/Plan:   discharge planning   Anticipated DC Date:  06/02/2014   Anticipated DC Plan:  SKILLED NURSING FACILITY      DC Planning Services  CM consult      Choice offered to / List presented to:             Status of service:   Medicare Important Message given?   (If response is "NO", the following Medicare IM given date fields will be blank) Date Medicare IM given:   Medicare IM given by:   Date Additional Medicare IM given:   Additional Medicare IM given by:    Discharge Disposition:  SKILLED NURSING FACILITY  Per UR Regulation:    If discussed at Long Length of Stay Meetings, dates discussed:    Comments:  06/01/14 15:15 CM notes pt to go to SNF for rehab; CSW arranging.  No other CM needs were communicated.  Freddy JakschSarah Benigno Check, BSN, CM [phone].

## 2014-06-01 NOTE — Progress Notes (Signed)
Clinical Social Work Department BRIEF PSYCHOSOCIAL ASSESSMENT 06/01/2014  Patient:  Patrick Knapp, Patrick Knapp     Account Number:  [id number]     Admit date:  05/31/2014  Clinical Social Worker:  Lacie Scotts  Date/Time:  06/01/2014 08:25 AM  Referred by:  Physician  Date Referred:  06/01/2014 Referred for  SNF Placement   Other Referral:   Interview type:  Patient Other interview type:    PSYCHOSOCIAL DATA Living Status:  WIFE Admitted from facility:   Level of care:   Primary support name:  Mardene Celeste Primary support relationship to patient:  SPOUSE Degree of support available:   Supportive    CURRENT CONCERNS Current Concerns  Post-Acute Placement   Other Concerns:    SOCIAL WORK ASSESSMENT / PLAN Pt is an 24 yr. old gentleman living at home prior to hospitalization. CSW met with pt / spouse to assist with d/c planning. This is a planned admission. Pt has made prior arrangements to have ST Rehab at [facility]. CSW has contacted SNF and d/c plans have been confirmed. Pt has [employer] which requires prior authorization. CSW has spoken to [place] at [place] and authorization will be provided at d/c.   Assessment/plan status:  Psychosocial Support/Ongoing Assessment of Needs Other assessment/ plan:   Information/referral to community resources:   Insurance coverage  for SNF and ambulance transport reviewed.    PATIENT'S/FAMILY'S RESPONSE TO PLAN OF CARE: Pt's mood is bright. " I didn't sleep at all last night but I don't have any pain. " Pt is motivated to begin therapy and is looking forward to having rehab at [facility].    Werner Lean LCSW [phone]

## 2014-06-01 NOTE — Discharge Summary (Signed)
Physician Discharge Summary   Patient ID: Patrick Knapp MRN: [id number] DOB/AGE: 1927-08-16 80 y.o.  Admit date: 05/31/2014 Discharge date: 06-02-2014  Primary Diagnosis:  Osteoarthritis Right knee(s) Admission Diagnoses:  Past Medical History  Diagnosis Date  . Coronary artery disease     post PTCA and stenting of his left circumflex artery in 12/08  . Hypertension   . Hyperlipidemia   . Ischemia   . Shortness of breath   . Enlarged prostate   . GERD (gastroesophageal reflux disease)   . Arthritis   . Skin disorder     Occurs at times as rash, whelps, blister-like areas  with itching sometimes-has occurred for years , but can not find an exact dx or cause   Discharge Diagnoses:   Principal Problem:   OA (osteoarthritis) of knee  Estimated body mass index is 30.42 kg/(m^2) as calculated from the following:   Height as of this encounter: _0  (1.727 m).   Weight as of this encounter: 90.719 kg (200 lb).  Procedure:  Procedure(s) (LRB): RIGHT TOTAL KNEE ARTHROPLASTY (Right)   Consults: None  HPI: Patrick Knapp is a 80 y.o. year old male with end stage OA of his right knee with progressively worsening pain and dysfunction. He has constant pain, with activity and at rest and significant functional deficits with difficulties even with ADLs. He has had extensive non-op management including analgesics, injections of cortisone and viscosupplements, and home exercise program, but remains in significant pain with significant dysfunction. Radiographs show bone on bone arthritis medial and patellofemoral. He presents now for right Total Knee Arthroplasty.   Laboratory Data: Admission on 05/31/2014  Component Date Value Ref Range Status  . WBC 06/01/2014 11.3* 4.0 - 10.5 K/uL Final  . RBC 06/01/2014 4.32  4.22 - 5.81 MIL/uL Final  . Hemoglobin 06/01/2014 12.2* 13.0 - 17.0 g/dL Final  . HCT 06/01/2014 36.4* 39.0 - 52.0 % Final  . MCV 06/01/2014 84.3  78.0 - 100.0 fL Final   . MCH 06/01/2014 28.2  26.0 - 34.0 pg Final  . MCHC 06/01/2014 33.5  30.0 - 36.0 g/dL Final  . RDW 06/01/2014 13.1  11.5 - 15.5 % Final  . Platelets 06/01/2014 142* 150 - 400 K/uL Final  . Sodium 06/01/2014 141  137 - 147 mEq/L Final  . Potassium 06/01/2014 3.9  3.7 - 5.3 mEq/L Final  . Chloride 06/01/2014 106  96 - 112 mEq/L Final  . CO2 06/01/2014 20  19 - 32 mEq/L Final  . Glucose, Bld 06/01/2014 135* 70 - 99 mg/dL Final  . BUN 06/01/2014 22  6 - 23 mg/dL Final  . Creatinine, Ser 06/01/2014 1.24  0.50 - 1.35 mg/dL Final  . Calcium 06/01/2014 8.8  8.4 - 10.5 mg/dL Final  . GFR calc non Af Amer 06/01/2014 52* >90 mL/min Final  . GFR calc Af Amer 06/01/2014 60* >90 mL/min Final   Comment: (NOTE) The eGFR has been calculated using the CKD EPI equation. This calculation has not been validated in all clinical situations. eGFR's persistently <90 mL/min signify possible Chronic Kidney Disease.   Georgiann Hahn gap 06/01/2014 15  5 - 15 Final  Hospital Outpatient Visit on 05/24/2014  Component Date Value Ref Range Status  . MRSA, PCR 05/24/2014 NEGATIVE  NEGATIVE Final  . Staphylococcus aureus 05/24/2014 NEGATIVE  NEGATIVE Final   Comment:        The Xpert SA Assay (FDA approved for NASAL specimens in patients over 61 years of age), is one component  of a comprehensive surveillance program.  Test performance has been validated by [facility] for patients greater than or equal to 33 year old. It is not intended to diagnose infection nor to guide or monitor treatment.   Marland Kitchen aPTT 05/24/2014 33  24 - 37 seconds Final  . WBC 05/24/2014 6.7  4.0 - 10.5 K/uL Final  . RBC 05/24/2014 5.09  4.22 - 5.81 MIL/uL Final  . Hemoglobin 05/24/2014 14.3  13.0 - 17.0 g/dL Final  . HCT 05/24/2014 43.2  39.0 - 52.0 % Final  . MCV 05/24/2014 84.9  78.0 - 100.0 fL Final  . MCH 05/24/2014 28.1  26.0 - 34.0 pg Final  . MCHC 05/24/2014 33.1  30.0 - 36.0 g/dL Final  . RDW 05/24/2014 13.4  11.5 - 15.5 % Final    . Platelets 05/24/2014 140* 150 - 400 K/uL Final  . Sodium 05/24/2014 140  137 - 147 mEq/L Final  . Potassium 05/24/2014 4.5  3.7 - 5.3 mEq/L Final  . Chloride 05/24/2014 103  96 - 112 mEq/L Final  . CO2 05/24/2014 23  19 - 32 mEq/L Final  . Glucose, Bld 05/24/2014 101* 70 - 99 mg/dL Final  . BUN 05/24/2014 24* 6 - 23 mg/dL Final  . Creatinine, Ser 05/24/2014 1.24  0.50 - 1.35 mg/dL Final  . Calcium 05/24/2014 9.5  8.4 - 10.5 mg/dL Final  . Total Protein 05/24/2014 8.0  6.0 - 8.3 g/dL Final  . Albumin 05/24/2014 3.9  3.5 - 5.2 g/dL Final  . AST 05/24/2014 20  0 - 37 U/L Final  . ALT 05/24/2014 14  0 - 53 U/L Final  . Alkaline Phosphatase 05/24/2014 82  39 - 117 U/L Final  . Total Bilirubin 05/24/2014 0.6  0.3 - 1.2 mg/dL Final  . GFR calc non Af Amer 05/24/2014 52* >90 mL/min Final  . GFR calc Af Amer 05/24/2014 60* >90 mL/min Final   Comment: (NOTE) The eGFR has been calculated using the CKD EPI equation. This calculation has not been validated in all clinical situations. eGFR's persistently <90 mL/min signify possible Chronic Kidney Disease.   . Anion gap 05/24/2014 14  5 - 15 Final  . Prothrombin Time 05/24/2014 14.5  11.6 - 15.2 seconds Final  . INR 05/24/2014 1.12  0.00 - 1.49 Final  . ABO/RH(D) 05/24/2014 B POS   Final  . Antibody Screen 05/24/2014 NEG   Final  . Sample Expiration 05/24/2014 06/03/2014   Final  . Color, Urine 05/24/2014 YELLOW  YELLOW Final  . APPearance 05/24/2014 CLEAR  CLEAR Final  . Specific Gravity, Urine 05/24/2014 1.015  1.005 - 1.030 Final  . pH 05/24/2014 6.0  5.0 - 8.0 Final  . Glucose, UA 05/24/2014 NEGATIVE  NEGATIVE mg/dL Final  . Hgb urine dipstick 05/24/2014 NEGATIVE  NEGATIVE Final  . Bilirubin Urine 05/24/2014 NEGATIVE  NEGATIVE Final  . Ketones, ur 05/24/2014 NEGATIVE  NEGATIVE mg/dL Final  . Protein, ur 05/24/2014 NEGATIVE  NEGATIVE mg/dL Final  . Urobilinogen, UA 05/24/2014 0.2  0.0 - 1.0 mg/dL Final  . Nitrite 05/24/2014 NEGATIVE   NEGATIVE Final  . Leukocytes, UA 05/24/2014 NEGATIVE  NEGATIVE Final   MICROSCOPIC NOT DONE ON URINES WITH NEGATIVE PROTEIN, BLOOD, LEUKOCYTES, NITRITE, OR GLUCOSE <1000 mg/dL.  . ABO/RH(D) 05/24/2014 B POS   Final     X-Rays:Dg Chest 2 View  05/24/2014   CLINICAL DATA:  Preop right total knee surgery.  EXAM: CHEST  2 VIEW  COMPARISON:  Chest radiograph and CT  chest 08/09/2009.  FINDINGS: Trachea is midline. Heart size is grossly stable. Pulmonary arteries appear prominent. Lungs are low in volume with a peripheral pattern of reticular nodularity, as on 08/09/2009. No focal airspace consolidation. No pleural fluid. Right hemidiaphragm is elevated, as before. Lower thoracic compression fractures appear progressive from 08/09/2009.  IMPRESSION: 1. Pulmonary parenchymal pattern of subpleural reticulonodularity, as on 08/09/2009, indicative of fibrotic interstitial lung disease. If assessment for progression is desired, high-resolution chest CT without contrast is recommended. 2. No acute findings in the chest. 3. Thoracic compression fractures are mild but appear progressive from 08/09/2009.   Electronically Signed   By: Lorin Picket M.D.   On: 05/24/2014 09:42    EKG: Orders placed or performed in visit on 03/17/14  . EKG 12-Lead     Hospital Course: Patrick Knapp is a 80 y.o. who was admitted to [facility]. They were brought to the operating room on 05/31/2014 and underwent Procedure(s): RIGHT TOTAL KNEE ARTHROPLASTY.  Patient tolerated the procedure well and was later transferred to the recovery room and then to the orthopaedic floor for postoperative care.  They were given PO and IV analgesics for pain control following their surgery.  They were given 24 hours of postoperative antibiotics of  Anti-infectives    Start     Dose/Rate Route Frequency Ordered Stop   05/31/14 1830  ceFAZolin (ANCEF) IVPB 2 g/50 mL premix     2 g100 mL/hr over 30 Minutes Intravenous Every 6 hours  05/31/14 1637 06/01/14 0203   05/31/14 0940  ceFAZolin (ANCEF) IVPB 2 g/50 mL premix     2 g100 mL/hr over 30 Minutes Intravenous On call to O.R. 05/31/14 0940 05/31/14 1254     and started on DVT prophylaxis in the form of Xarelto.   PT and OT were ordered for total joint protocol.  Discharge planning consulted to help with postop disposition and equipment needs.  Patient had a great night on the evening of surgery.  They started to get up OOB with therapy on day one. Hemovac drain was pulled without difficulty. Patient reported no pain at this time on rounds when seen by Dr. Wynelle Link.Patient seen in rounds with Dr. Wynelle Link. Patient sitting up on side of bed eating breakfast. He doing okay that morning. Continued to work with therapy into day two.  Dressing was changed on day two and the incision was healing well. Patient was seen in rounds and was ready to go to the SNF on POD 2 to [facility].  Discharge to SNF Diet - Cardiac diet Follow up - in 2 weeks Activity - WBAT Disposition - Skilled nursing facility Condition Upon Discharge - Good D/C Meds - See DC Summary DVT Prophylaxis - Xarelto, Plavix on hold for now  Discharge Instructions    Call MD / Call 911    Complete by:  As directed   If you experience chest pain or shortness of breath, CALL 911 and be transported to the hospital emergency room.  If you develope a fever above 101 F, pus (white drainage) or increased drainage or redness at the wound, or calf pain, call your surgeon's office.     Change dressing    Complete by:  As directed   Change dressing daily with sterile 4 x 4 inch gauze dressing and apply TED hose. Do not submerge the incision under water.     Constipation Prevention    Complete by:  As directed   Drink plenty of fluids.  Prune juice may be helpful.  You may use a stool softener, such as Colace (over the counter) 100 mg twice a day.  Use MiraLax (over the counter) for constipation as needed.     Diet - low  sodium heart healthy    Complete by:  As directed      Discharge instructions    Complete by:  As directed   Pick up stool softner and laxative for home. Do not submerge incision under water. May shower. Continue to use ice for pain and swelling from surgery.  Take Xarelto for two and a half more weeks, then discontinue Xarelto. Once the patient has completed the Xarelto, they may resume the Plavix 75 mg daily.  When discharged from the skilled rehab facility, please have the facility set up the patient's [place] prior to being released.   Also provide the patient with their medications at time of release from the facility to include their pain medication, the muscle relaxants, and their blood thinner medication.  If the patient is still at the rehab facility at time of follow up appointment, please also assist the patient in arranging follow up appointment in our office and any transportation needs. ICE to the affected knee or hip every three hours for 30 minutes at a time and then as needed for pain and swelling.   Postoperative Constipation Protocol  Constipation - defined medically as fewer than three stools per week and severe constipation as less than one stool per week.  One of the most common issues patients have following surgery is constipation.  Even if you have a regular bowel pattern at home, your normal regimen is likely to be disrupted due to multiple reasons following surgery.  Combination of anesthesia, postoperative narcotics, change in appetite and fluid intake all can affect your bowels.  In order to avoid complications following surgery, here are some recommendations in order to help you during your recovery period.  Colace (docusate) - Pick up an over-the-counter form of Colace or another stool softener and take twice a day as long as you are requiring postoperative pain medications.  Take with a full glass of water daily.  If you experience loose  stools or diarrhea, hold the colace until you stool forms back up.  If your symptoms do not get better within 1 week or if they get worse, check with your doctor.  Dulcolax (bisacodyl) - Pick up over-the-counter and take as directed by the product packaging as needed to assist with the movement of your bowels.  Take with a full glass of water.  Use this product as needed if not relieved by Colace only.   MiraLax (polyethylene glycol) - Pick up over-the-counter to have on hand.  MiraLax is a solution that will increase the amount of water in your bowels to assist with bowel movements.  Take as directed and can mix with a glass of water, juice, soda, coffee, or tea.  Take if you go more than two days without a movement. Do not use MiraLax more than once per day. Call your doctor if you are still constipated or irregular after using this medication for 7 days in a row.  If you continue to have problems with postoperative constipation, please contact the office for further assistance and recommendations.  If you experience "the worst abdominal pain ever" or develop nausea or vomiting, please contact the office immediatly for further recommendations for treatment.     Do not put a pillow  under the knee. Place it under the heel.    Complete by:  As directed      Do not sit on low chairs, stoools or toilet seats, as it may be difficult to get up from low surfaces    Complete by:  As directed      Driving restrictions    Complete by:  As directed   No driving until released by the physician.     Increase activity slowly as tolerated    Complete by:  As directed      Lifting restrictions    Complete by:  As directed   No lifting until released by the physician.     Patient may shower    Complete by:  As directed   You may shower without a dressing once there is no drainage.  Do not wash over the wound.  If drainage remains, do not shower until drainage stops.     TED hose    Complete by:  As directed     Use stockings (TED hose) for 3 weeks on both leg(s).  You may remove them at night for sleeping.     Weight bearing as tolerated    Complete by:  As directed             Medication List    STOP taking these medications        clopidogrel 75 MG tablet  Commonly known as:  PLAVIX     fish oil-omega-3 fatty acids 1000 MG capsule     glucosamine-chondroitin 500-400 MG tablet     Melatonin 5 MG Tabs     OMEGA-3-6-9 PO     OVER THE COUNTER MEDICATION     OVER THE COUNTER MEDICATION     vitamin A 25000 UNIT capsule      TAKE these medications        acetaminophen 325 MG tablet  Commonly known as:  TYLENOL  Take 2 tablets (650 mg total) by mouth every 6 (six) hours as needed for mild pain (or Fever >/= 101).     atorvastatin 20 MG tablet  Commonly known as:  LIPITOR  Take 20 mg by mouth daily.     bisacodyl 10 MG suppository  Commonly known as:  DULCOLAX  Place 1 suppository (10 mg total) rectally daily as needed for moderate constipation.     diphenhydrAMINE 25 mg capsule  Commonly known as:  BENADRYL  Take 25 mg by mouth every 6 (six) hours as needed for allergies.     doxazosin 4 MG tablet  Commonly known as:  CARDURA  Take 4 mg by mouth every morning.     DSS 100 MG Caps  Take 100 mg by mouth 2 (two) times daily.     finasteride 1 MG tablet  Commonly known as:  PROPECIA  Take 1 mg by mouth daily.     finasteride 5 MG tablet  Commonly known as:  PROSCAR  Take 5 mg by mouth daily.     magnesium gluconate 500 MG tablet  Commonly known as:  MAGONATE  Take 500 mg by mouth daily.     methocarbamol 500 MG tablet  Commonly known as:  ROBAXIN  Take 1 tablet (500 mg total) by mouth every 6 (six) hours as needed for muscle spasms.     metoCLOPramide 5 MG tablet  Commonly known as:  REGLAN  Take 1 tablet (5 mg total) by mouth every 8 (eight) hours as needed for nausea (  if ondansetron (ZOFRAN) ineffective.).     metoprolol tartrate 25 MG tablet  Commonly  known as:  LOPRESSOR  Take 25 mg by mouth every morning.     metoprolol tartrate 25 MG tablet  Commonly known as:  LOPRESSOR  Take 1 tablet (25 mg total) by mouth 2 (two) times daily.     ondansetron 4 MG tablet  Commonly known as:  ZOFRAN  Take 1 tablet (4 mg total) by mouth every 6 (six) hours as needed for nausea.     oxyCODONE 5 MG immediate release tablet  Commonly known as:  Oxy IR/ROXICODONE  Take 1-2 tablets (5-10 mg total) by mouth every 3 (three) hours as needed for breakthrough pain.     pantoprazole 40 MG tablet  Commonly known as:  PROTONIX  Take 40 mg by mouth daily. DAILY AT HS     polyethylene glycol packet  Commonly known as:  MIRALAX / GLYCOLAX  Take 17 g by mouth daily as needed for mild constipation.     psyllium 0.52 G capsule  Commonly known as:  REGULOID  Take 0.52 g by mouth daily.     rivaroxaban 10 MG Tabs tablet  Commonly known as:  XARELTO  - Take 1 tablet (10 mg total) by mouth daily with breakfast. Take Xarelto for two and a half more weeks, then discontinue Xarelto.  - Once the patient has completed the Xarelto, they may resume the Plavix 75 mg daily.     simvastatin 40 MG tablet  Commonly known as:  ZOCOR  Take 1 tablet (40 mg total) by mouth every evening.     SLEEP AID PO  Take 1 tablet by mouth at bedtime as needed (for sleep).     traMADol 50 MG tablet  Commonly known as:  ULTRAM  Take 1-2 tablets (50-100 mg total) by mouth every 6 (six) hours as needed (mild to moderate pain).           Follow-up Information    Follow up with Gearlean Alf, MD On 06/15/2014.   Specialty:  Orthopedic Surgery   Why:  Call office ASAP at [phone] to set up appointment on Tuesday 06/15/2014 with Dr. Wynelle Link.   Contact information:   [address] [place] [address] [phone]       Signed: Arlee Muslim, PA-C Orthopaedic Surgery 06/01/2014, 10:19 PM

## 2014-06-01 NOTE — Evaluation (Signed)
Occupational Therapy Evaluation Patient Details Name: Patrick LopesCarmelo Bonny MRN: [id number] DOB: October 26, 1927 Today's Date: 06/01/2014    History of Present Illness RTKA   Clinical Impression   Pt is an 80 year old man who was admitted for R TKA.  He will benefit from skilled OT in acute and at [facility] to increase independence with adls.  Goals in acute are supervision level.  Pt was independent prior to admission    Follow Up Recommendations  SNF    Equipment Recommendations  3 in 1 bedside comode    Recommendations for Other Services       Precautions / Restrictions Precautions Precautions: Knee;Fall Required Braces or Orthoses: Knee Immobilizer - Right Knee Immobilizer - Right: Discontinue once straight leg raise with < 10 degree lag Restrictions Weight Bearing Restrictions: No      Mobility Bed Mobility Overal bed mobility: Needs Assistance Bed Mobility: Supine to Sit     Supine to sit: Min assist     General bed mobility comments: not tested in OT  Transfers Overall transfer level: Needs assistance Equipment used: Rolling walker (2 wheeled) Transfers: Sit to/from Stand Sit to Stand: Min assist;From elevated surface         General transfer comment: cues for safety, hand placement, pt  attempted to sit  to recliner before turning  around and getting close enough: came in at the end of PT and saw this transfer.    Needed cues for each sit to stand trial  (2 of each)    Balance                                            ADL Overall ADL's : Needs assistance/impaired             Lower Body Bathing: Minimal assistance;Sit to/from stand;With adaptive equipment       Lower Body Dressing: Minimal assistance;Sit to/from stand   Toilet Transfer: Minimal assistance;BSC;Ambulation   Toileting- Clothing Manipulation and Hygiene: Minimal assistance;Sit to/from stand         General ADL Comments: pt is able to complete UB adls with set up.   Introduced AE and pt practiced with Facilities managerreacher and sock aide     Vision                     Perception     Praxis      Pertinent Vitals/Pain Pain Assessment: 0-10 Pain Score: 7  Pain Location: R knee Pain Descriptors / Indicators: Aching Pain Intervention(s): Limited activity within patient's tolerance;Monitored during session;Premedicated before session;Repositioned;Ice applied     Hand Dominance     Extremity/Trunk Assessment Upper Extremity Assessment Upper Extremity Assessment: Overall WFL for tasks assessed          Communication Communication Communication: No difficulties   Cognition Arousal/Alertness: Awake/alert Behavior During Therapy: WFL for tasks assessed/performed Overall Cognitive Status: Within Functional Limits for tasks assessed                     General Comments       Exercises       Shoulder Instructions      Home Living Family/patient expects to be discharged to:: Skilled nursing facility Living Arrangements: Spouse/significant other  Prior Functioning/Environment Level of Independence: Independent             OT Diagnosis: Generalized weakness   OT Problem List: Decreased strength;Decreased activity tolerance;Decreased safety awareness;Decreased knowledge of use of DME or AE;Pain   OT Treatment/Interventions: Self-care/ADL training;DME and/or AE instruction;Patient/family education    OT Goals(Current goals can be found in the care plan section) Acute Rehab OT Goals Patient Stated Goal: to go to rehab  and back to work OT Goal Formulation: With patient Time For Goal Achievement: 06/08/14 Potential to Achieve Goals: Good ADL Goals Pt Will Perform Lower Body Bathing: with supervision;with adaptive equipment;sit to/from stand Pt Will Perform Lower Body Dressing: with supervision;with adaptive equipment;sit to/from stand Pt Will Transfer to Toilet: with min  guard assist;ambulating;bedside commode Pt Will Perform Toileting - Clothing Manipulation and hygiene: with supervision;sit to/from stand  OT Frequency: Min 2X/week   Barriers to D/C:            Co-evaluation              End of Session CPM Right Knee CPM Right Knee: Off  Activity Tolerance: Patient tolerated treatment well Patient left: in chair;with call bell/phone within reach   Time: 1114-1140 OT Time Calculation (min): 26 min Charges:  OT General Charges $OT Visit: 1 Procedure OT Evaluation $Initial OT Evaluation Tier I: 1 Procedure OT Treatments $Self Care/Home Management : 8-22 mins G-Codes:    Natalie Mceuen 06/01/2014, 12:10 PM  Marica OtterMaryellen Aashrith Eves, OTR/L [phone] 06/01/2014

## 2014-06-01 NOTE — Plan of Care (Signed)
Problem: Phase II Progression Outcomes Goal: Ambulates Outcome: Completed/Met Date Met:  06/01/14 Goal: Tolerating diet Outcome: Completed/Met Date Met:  06/01/14 Goal: Discharge plan established Outcome: Completed/Met Date Met:  06/01/14 Goal: Other Phase II Outcomes/Goals Outcome: Completed/Met Date Met:  06/01/14

## 2014-06-02 LAB — BASIC METABOLIC PANEL
ANION GAP: 9 (ref 5–15)
BUN: 23 mg/dL (ref 6–23)
CALCIUM: 8.7 mg/dL (ref 8.4–10.5)
CHLORIDE: 111 meq/L (ref 96–112)
CO2: 23 meq/L (ref 19–32)
CREATININE: 1.25 mg/dL (ref 0.50–1.35)
GFR calc Af Amer: 60 mL/min — ABNORMAL LOW (ref >90)
GFR calc non Af Amer: 51 mL/min — ABNORMAL LOW (ref >90)
GLUCOSE: 176 mg/dL — AB (ref 70–99)
Potassium: 4.4 mEq/L (ref 3.7–5.3)
Sodium: 143 mEq/L (ref 137–147)

## 2014-06-02 LAB — CBC
HEMATOCRIT: 33.1 % — AB (ref 39.0–52.0)
HEMOGLOBIN: 11.1 g/dL — AB (ref 13.0–17.0)
MCH: 28.5 pg (ref 26.0–34.0)
MCHC: 33.5 g/dL (ref 30.0–36.0)
MCV: 84.9 fL (ref 78.0–100.0)
Platelets: 138 10*3/uL — ABNORMAL LOW (ref 150–400)
RBC: 3.9 MIL/uL — AB (ref 4.22–5.81)
RDW: 13.8 % (ref 11.5–15.5)
WBC: 12.9 10*3/uL — AB (ref 4.0–10.5)

## 2014-06-02 NOTE — Progress Notes (Signed)
Clinical Social Work Department CLINICAL SOCIAL WORK PLACEMENT NOTE 06/02/2014  Patient:  Patrick LopesCAMBARERI,Derius  Account Number:  [id number] Admit date:  05/31/2014  Clinical Social Worker:  Cori RazorJAMIE Caffie Sotto, LCSW  Date/time:  06/01/2014 09:41 AM  Clinical Social Work is seeking post-discharge placement for this patient at the following level of care:   SKILLED NURSING   (*CSW will update this form in Epic as items are completed)     Patient/family provided with [facility] Department of Clinical Social Work's list of facilities offering this level of care within the geographic area requested by the patient (or if unable, by the patient's family).  06/01/2014  Patient/family informed of their freedom to choose among providers that offer the needed level of care, that participate in Medicare, Medicaid or managed care program needed by the patient, have an available bed and are willing to accept the patient.    Patient/family informed of [facility]' ownership interest in [facility], as well as of the fact that they are under no obligation to receive care at this facility.  PASARR submitted to EDS on 05/31/2014 PASARR number received on 05/31/2014  FL2 transmitted to all facilities in geographic area requested by pt/family on  06/01/2014 FL2 transmitted to all facilities within larger geographic area on   Patient informed that his/her managed care company has contracts with or will negotiate with  certain facilities, including the following:     Patient/family informed of bed offers received:  06/01/2014 [facility] Physician recommends and patient chooses bed at    Patient to be [facility] on  06/02/2014 Patient to be transferred to facility by [facility] Patient and family notified of transfer on 06/02/2014 Name of family member notified:  SPOUSE  The following physician request were entered in Epic:   Additional Comments: Pt /  spouse are in agreement with d/c to SNF today. [facility] transport required. [employer] provided prior auth for SNF placement. NSG reviewed d/c summary, scripts, avs. Scripts included in d/c packet.  Cori RazorJamie Janei Scheff LCSW [phone]

## 2014-06-02 NOTE — Progress Notes (Signed)
Physical Therapy Treatment Patient Details Name: Georgia LopesCarmelo Wildeman MRN: [id number] DOB: 22-Jan-1928 Today's Date: 06/02/2014    History of Present Illness RTKA    PT Comments    Pt is improving. For SNF soon   Follow Up Recommendations  SNF;Supervision/Assistance - 24 hour     Equipment Recommendations  Rolling walker with 5" wheels    Recommendations for Other Services       Precautions / Restrictions Precautions Precautions: Knee;Fall Required Braces or Orthoses: Knee Immobilizer - Right Knee Immobilizer - Right: Discontinue once straight leg raise with < 10 degree lag Restrictions Weight Bearing Restrictions: No    Mobility  Bed Mobility   Bed Mobility: Supine to Sit     Supine to sit: Min assist     General bed mobility comments: support R leg off   Transfers   Equipment used: Rolling walker (2 wheeled) Transfers: Sit to/from Stand Sit to Stand: Min assist         General transfer comment: cues for safety, hand placement, pt  attmpted to sit  to recliner before turning  around and getting close enough.  Ambulation/Gait Ambulation/Gait assistance: Min assist Ambulation Distance (Feet): 90 Feet Assistive device: Rolling walker (2 wheeled) Gait Pattern/deviations: Step-to pattern;Trunk flexed;Decreased step length - right;Decreased stance time - right Gait velocity: slow   General Gait Details: cues for sequence and posture, step length   Stairs            Wheelchair Mobility    Modified Rankin (Stroke Patients Only)       Balance                                    Cognition Arousal/Alertness: Awake/alert                          Exercises Total Joint Exercises Ankle Circles/Pumps: AROM Quad Sets: AROM;Both;10 reps;Supine Towel Squeeze: AROM;Both;10 reps;Supine Short Arc Quad: AROM;Right;10 reps;Supine Heel Slides: AAROM;Right;10 reps;Supine Hip ABduction/ADduction: AROM;Right;10  reps;Supine Straight Leg Raises: AAROM;Right;15 reps;Supine Goniometric ROM: 10-60 R knee    General Comments        Pertinent Vitals/Pain Pain Score: 3  Pain Location: R thigh Pain Descriptors / Indicators: Aching;Sore Pain Intervention(s): Monitored during session;Premedicated before session;Ice applied;Heat applied    Home Living                      Prior Function            PT Goals (current goals can now be found in the care plan section) Progress towards PT goals: Progressing toward goals    Frequency  7X/week    PT Plan Current plan remains appropriate    Co-evaluation             End of Session Equipment Utilized During Treatment: Right knee immobilizer Activity Tolerance: Patient tolerated treatment well Patient left: in chair;with call bell/phone within reach;with family/visitor present     Time: [id number] PT Time Calculation (min) (ACUTE ONLY): 43 min  Charges:  $Gait Training: 8-22 mins $Therapeutic Exercise: 8-22 mins $Self Care/Home Management: 8-22                    G Codes:      [name], Florencia Zaccaro Elizabeth 06/02/2014, 11:27 AM

## 2014-06-02 NOTE — Progress Notes (Signed)
   Subjective: 2 Days Post-Op Procedure(s) (LRB): RIGHT TOTAL KNEE ARTHROPLASTY (Right) Patient reports pain as mild.   Patient seen in rounds by Dr. Lequita HaltAluisio. Patient is well, and has had no acute complaints or problems Patient is [facility]  Objective: Vital signs in last 24 hours: Temp:  [97.9 F (36.6 C)-98.3 F (36.8 C)] 97.9 F (36.6 C) (11/25 0648) Pulse Rate:  [51-69] 63 (11/25 1014) Resp:  [14-16] 16 (11/25 0800) BP: (128-151)/(52-69) 151/64 mmHg (11/25 1014) SpO2:  [95 %-99 %] 99 % (11/25 0800)  Intake/Output from previous day:  Intake/Output Summary (Last 24 hours) at 06/02/14 1113 Last data filed at 06/02/14 0906  Gross per 24 hour  Intake    968 ml  Output   1700 ml  Net   -732 ml    Intake/Output this shift: Total I/O In: 360 [P.O.:360] Out: 200 [Urine:200]  Labs:  Recent Labs  06/01/14 0435 06/02/14 0422  HGB 12.2* 11.1*    Recent Labs  06/01/14 0435 06/02/14 0422  WBC 11.3* 12.9*  RBC 4.32 3.90*  HCT 36.4* 33.1*  PLT 142* 138*    Recent Labs  06/01/14 0435 06/02/14 0422  NA 141 143  K 3.9 4.4  CL 106 111  CO2 20 23  BUN 22 23  CREATININE 1.24 1.25  GLUCOSE 135* 176*  CALCIUM 8.8 8.7   No results for input(s): LABPT, INR in the last 72 hours.  EXAM: General - Patient is Alert, Appropriate and Oriented Extremity - Neurovascular intact Sensation intact distally Incision - clean, dry, no drainage Motor Function - intact, moving foot and toes well on exam.   Assessment/Plan: 2 Days Post-Op Procedure(s) (LRB): RIGHT TOTAL KNEE ARTHROPLASTY (Right) Procedure(s) (LRB): RIGHT TOTAL KNEE ARTHROPLASTY (Right) Past Medical History  Diagnosis Date  . Coronary artery disease     post PTCA and stenting of his left circumflex artery in 12/08  . Hypertension   . Hyperlipidemia   . Ischemia   . Shortness of breath   . Enlarged prostate   . GERD (gastroesophageal reflux disease)   . Arthritis   . Skin disorder     Occurs at times as rash, whelps, blister-like areas  with itching sometimes-has occurred for years , but can not find an exact dx or cause   Principal Problem:   OA (osteoarthritis) of knee  Estimated body mass index is 30.42 kg/(m^2) as calculated from the following:   Height as of this encounter: 5\' 8"  (1.727 m).   Weight as of this encounter: 90.719 kg (200 lb). Up with therapy Discharge to SNF Diet - Cardiac diet Follow up - in 2 weeks Activity - WBAT Disposition - Skilled nursing facility Condition Upon Discharge - Good D/C Meds - See DC Summary DVT Prophylaxis - Xarelto, Plavix on hold for now  Avel Peacerew Anyi Fels, PA-C Orthopaedic Surgery 06/02/2014, 11:13 AM

## 2014-06-02 NOTE — Plan of Care (Signed)
Problem: Phase III Progression Outcomes Goal: Pain controlled on oral analgesia Outcome: Completed/Met Date Met:  06/02/14 Goal: Ambulates Outcome: Completed/Met Date Met:  06/02/14 Goal: Incision clean - minimal/no drainage Outcome: Completed/Met Date Met:  06/02/14 Goal: Discharge plan remains appropriate-arrangements made Outcome: Completed/Met Date Met:  06/02/14 Goal: Anticoagulant follow-up in place Outcome: Not Applicable Date Met:  06/02/14 xarelto Goal: Other Phase III Outcomes/Goals Outcome: Not Applicable Date Met:  06/02/14  Problem: Discharge Progression Outcomes Goal: Barriers To Progression Addressed/Resolved Outcome: Not Applicable Date Met:  06/02/14 Goal: CMS/Neurovascular status at or above baseline Outcome: Completed/Met Date Met:  06/02/14 Goal: Anticoagulant follow-up in place Outcome: Not Applicable Date Met:  06/02/14 xarelto Goal: Pain controlled with appropriate interventions Outcome: Completed/Met Date Met:  06/02/14 Goal: Hemodynamically stable Outcome: Completed/Met Date Met:  06/02/14 Goal: Complications resolved/controlled Outcome: Not Applicable Date Met:  06/02/14 Goal: Tolerates diet Outcome: Completed/Met Date Met:  06/02/14 Goal: Activity appropriate for discharge plan Outcome: Completed/Met Date Met:  06/02/14 Goal: Ambulates safely using assistive device Outcome: Completed/Met Date Met:  06/02/14 Goal: Follows weight - bearing limitations Outcome: Completed/Met Date Met:  06/02/14 Goal: Discharge plan in place and appropriate Outcome: Completed/Met Date Met:  06/02/14 Goal: Negotiates stairs Outcome: Not Applicable Date Met:  06/02/14 For rehab at [facility] Goal: Demonstrates ADLs as appropriate Outcome: Completed/Met Date Met:  06/02/14 Goal: Incision without S/S infection Outcome: Completed/Met Date Met:  06/02/14 Goal: Other Discharge Outcomes/Goals Outcome: Not Applicable Date Met:  06/02/14     

## 2014-06-02 NOTE — Progress Notes (Signed)
Discharged from floor via stretcher, EMT with pt. Going to [facility] for [facility]. No changes in assessment. Report sheet sent with pt. Marquetta Weiskopf, [employer]

## 2014-06-08 ENCOUNTER — Non-Acute Institutional Stay (SKILLED_NURSING_FACILITY): Payer: Commercial Managed Care - HMO | Admitting: Adult Health

## 2014-06-08 DIAGNOSIS — K5909 Other constipation: Secondary | ICD-10-CM

## 2014-06-08 DIAGNOSIS — I10 Essential (primary) hypertension: Secondary | ICD-10-CM

## 2014-06-08 DIAGNOSIS — M1711 Unilateral primary osteoarthritis, right knee: Secondary | ICD-10-CM

## 2014-06-08 DIAGNOSIS — L03115 Cellulitis of right lower limb: Secondary | ICD-10-CM

## 2014-06-08 DIAGNOSIS — K219 Gastro-esophageal reflux disease without esophagitis: Secondary | ICD-10-CM

## 2014-06-08 DIAGNOSIS — E785 Hyperlipidemia, unspecified: Secondary | ICD-10-CM

## 2014-06-08 DIAGNOSIS — I25118 Atherosclerotic heart disease of native coronary artery with other forms of angina pectoris: Secondary | ICD-10-CM

## 2014-06-08 DIAGNOSIS — F419 Anxiety disorder, unspecified: Secondary | ICD-10-CM

## 2014-06-08 DIAGNOSIS — N400 Enlarged prostate without lower urinary tract symptoms: Secondary | ICD-10-CM

## 2014-06-09 ENCOUNTER — Non-Acute Institutional Stay (SKILLED_NURSING_FACILITY): Payer: Commercial Managed Care - HMO | Admitting: Internal Medicine

## 2014-06-09 DIAGNOSIS — N400 Enlarged prostate without lower urinary tract symptoms: Secondary | ICD-10-CM

## 2014-06-09 DIAGNOSIS — L03115 Cellulitis of right lower limb: Secondary | ICD-10-CM

## 2014-06-09 DIAGNOSIS — I10 Essential (primary) hypertension: Secondary | ICD-10-CM

## 2014-06-09 DIAGNOSIS — K219 Gastro-esophageal reflux disease without esophagitis: Secondary | ICD-10-CM

## 2014-06-09 DIAGNOSIS — E785 Hyperlipidemia, unspecified: Secondary | ICD-10-CM

## 2014-06-09 DIAGNOSIS — M1711 Unilateral primary osteoarthritis, right knee: Secondary | ICD-10-CM

## 2014-06-09 DIAGNOSIS — K5900 Constipation, unspecified: Secondary | ICD-10-CM

## 2014-06-09 NOTE — Progress Notes (Signed)
Patient ID: Patrick LopesCarmelo Alcott, male   DOB: 1928-01-25, 80 y.o.   MRN: [id number]     [facility]   PCP: Hoyle SauerAVVA,RAVISANKAR R, MD  Code Status: full code  Allergies  Allergen Reactions  . Chlorhexidine Gluconate Itching  . Iodinated Diagnostic Agents     Not really sure of the incident    Chief Complaint  Patient presents with  . New Admit To SNF     HPI:  80 y/o male pt is here for STR post hospital admission from 05/31/14-06/02/14 with right knee OA. He underwent right total knee arthroplasty  He has pmh of HTN, CAD, OA and HLD He is seen in his room today. He was having redness and warmth in his right surgical knee and was seen by orthopedic yesterday. He is on doxycycline for cellulitis. He remains afebrile. He mentions that current pain regimen is helpful. Bowel movement is regular. Denies any concerns  Review of Systems:  Constitutional: Negative for fever, chills, malaise/fatigue and diaphoresis.  HENT: Negative for congestion Respiratory: Negative for cough, sputum production, shortness of breath and wheezing.   Cardiovascular: Negative for chest pain, palpitations, leg swelling.  Gastrointestinal: Negative for heartburn, nausea, vomiting, abdominal pain Genitourinary: Negative for dysuria Musculoskeletal: Negative for back pain, falls. Positive for joint pain  Neurological: Negative for weakness,dizziness, tingling, focal weakness and headaches.  Psychiatric/Behavioral: Negative for depression  Past Medical History  Diagnosis Date  . Coronary artery disease     post PTCA and stenting of his left circumflex artery in 12/08  . Hypertension   . Hyperlipidemia   . Ischemia   . Shortness of breath   . Enlarged prostate   . GERD (gastroesophageal reflux disease)   . Arthritis   . Skin disorder     Occurs at times as rash, whelps, blister-like areas  with itching sometimes-has occurred for years , but can not find an exact dx or cause    Past Surgical History  Procedure Laterality Date  . Cardiac catheterization  06/2007    Est. EF of 55-60%PTCA and stenting of his left circumflex artery in 12/08  . Knee arthroscopy      SURGERY FOR FRACTURE OF LEFT EYE SOCKET  . Eye surgery  OCT 2015    LEFT CATARACT EXTRACTION   . Coronary angioplasty      STENTING OF HEART  . Total knee arthroplasty Right 05/31/2014    Procedure: RIGHT TOTAL KNEE ARTHROPLASTY;  Surgeon: Loanne DrillingFrank Aluisio V, MD;  Location: [facility];  Service: Orthopedics;  Laterality: Right;   Social History:   reports that he has never smoked. He has never used smokeless tobacco. He reports that he drinks alcohol. He reports that he does not use illicit drugs.  Family History  Problem Relation Age of Onset  . Heart failure Mother     possible  . Heart disease Mother   . Coronary artery disease Brother   . Heart disease Brother   . Heart disease Father     Medications: Patient's Medications  New Prescriptions   No medications on file  Previous Medications   ACETAMINOPHEN (TYLENOL) 325 MG TABLET    Take 2 tablets (650 mg total) by mouth every 6 (six) hours as needed for mild pain (or Fever >/= 101).   ATORVASTATIN (LIPITOR) 20 MG TABLET    Take 20 mg by mouth daily.    BISACODYL (DULCOLAX) 10 MG SUPPOSITORY    Place 1 suppository (10 mg total) rectally daily  as needed for moderate constipation.   DIPHENHYDRAMINE (BENADRYL) 25 MG CAPSULE    Take 25 mg by mouth every 6 (six) hours as needed for allergies.   DOCUSATE SODIUM 100 MG CAPS    Take 100 mg by mouth 2 (two) times daily.   DOXAZOSIN (CARDURA) 4 MG TABLET    Take 4 mg by mouth every morning.    DOXYLAMINE SUCCINATE, SLEEP, (SLEEP AID PO)    Take 1 tablet by mouth at bedtime as needed (for sleep).    FINASTERIDE (PROPECIA) 1 MG TABLET    Take 1 mg by mouth daily.    FINASTERIDE (PROSCAR) 5 MG TABLET    Take 5 mg by mouth daily.   MAGNESIUM GLUCONATE (MAGONATE) 500 MG TABLET    Take 500 mg by mouth daily.    METHOCARBAMOL (ROBAXIN) 500 MG TABLET    Take 1 tablet (500 mg total) by mouth every 6 (six) hours as needed for muscle spasms.   METOCLOPRAMIDE (REGLAN) 5 MG TABLET    Take 1 tablet (5 mg total) by mouth every 8 (eight) hours as needed for nausea (if ondansetron (ZOFRAN) ineffective.).   METOPROLOL TARTRATE (LOPRESSOR) 25 MG TABLET    Take 1 tablet (25 mg total) by mouth 2 (two) times daily.   METOPROLOL TARTRATE (LOPRESSOR) 25 MG TABLET    Take 25 mg by mouth every morning.   ONDANSETRON (ZOFRAN) 4 MG TABLET    Take 1 tablet (4 mg total) by mouth every 6 (six) hours as needed for nausea.   OXYCODONE (OXY IR/ROXICODONE) 5 MG IMMEDIATE RELEASE TABLET    Take 1-2 tablets (5-10 mg total) by mouth every 3 (three) hours as needed for breakthrough pain.   PANTOPRAZOLE (PROTONIX) 40 MG TABLET    Take 40 mg by mouth daily. DAILY AT HS   POLYETHYLENE GLYCOL (MIRALAX / GLYCOLAX) PACKET    Take 17 g by mouth daily as needed for mild constipation.   PSYLLIUM (REGULOID) 0.52 G CAPSULE    Take 0.52 g by mouth daily.    RIVAROXABAN (XARELTO) 10 MG TABS TABLET    Take 1 tablet (10 mg total) by mouth daily with breakfast. Take Xarelto for two and a half more weeks, then discontinue Xarelto. Once the patient has completed the Xarelto, they may resume the Plavix 75 mg daily.   SIMVASTATIN (ZOCOR) 40 MG TABLET    Take 1 tablet (40 mg total) by mouth every evening.   TRAMADOL (ULTRAM) 50 MG TABLET    Take 1-2 tablets (50-100 mg total) by mouth every 6 (six) hours as needed (mild to moderate pain).  Modified Medications   No medications on file  Discontinued Medications   No medications on file     Physical Exam: Filed Vitals:   06/09/14 1002  BP: 138/70  Pulse: 67  Temp: 100.3 F (37.9 C)  Resp: 18  Weight: 208 lb 3.2 oz (94.439 kg)  SpO2: 95%    General- elderly male in no acute distress, overweight Head- atraumatic, normocephalic Eyes- no pallor, no icterus, no discharge Neck- no cervical  lymphadenopathy Cardiovascular- normal s1,s2, no murmurs, palpable dorsalis pedis Respiratory- bilateral clear to auscultation, no wheeze, no rhonchi, no crackles, no use of accessory muscles Abdomen- bowel sounds present, soft, non tender Musculoskeletal- able to move all 4 extremities, right knee ROM limited, right leg 1+ edema Neurological- no focal deficit Skin- warm and dry, erythema with warmth and swelling in right knee, strei strips in place, incision site appears fine  through strips Psychiatry- alert and oriented to person, place and time, normal mood and affect   Labs reviewed: Basic Metabolic Panel:  Recent Labs  16/04/9610/16/15 0900 06/01/14 0435 06/02/14 0422  NA 140 141 143  K 4.5 3.9 4.4  CL 103 106 111  CO2 23 20 23   GLUCOSE 101* 135* 176*  BUN 24* 22 23  CREATININE 1.24 1.24 1.25  CALCIUM 9.5 8.8 8.7   Liver Function Tests:  Recent Labs  03/17/14 0923 05/24/14 0900  AST 21 20  ALT 22 14  ALKPHOS 73 82  BILITOT 1.1 0.6  PROT 7.7 8.0  ALBUMIN 3.9 3.9   CBC:  Recent Labs  05/24/14 0900 06/01/14 0435 06/02/14 0422  WBC 6.7 11.3* 12.9*  HGB 14.3 12.2* 11.1*  HCT 43.2 36.4* 33.1*  MCV 84.9 84.3 84.9  PLT 140* 142* 138*    Assessment/Plan  Right knee OA S/p right knee arhtroplasty. Has f/u with Dr Claire ShownAluision on 06/15/14. Continue xarelto for dvt prophylaxis. His plavix is on hold now. Will have him work with physical therapy and occupational therapy team to help with gait training and muscle strengthening exercises.fall precautions. Skin care. Encourage to be out of bed. Continue to wear ted hose. Continue dressing change and skin care. WBAT. Continue prn robaxin for muscle spasm and oxyIR 5 mg 1-2 tab q3h pnr pain  Right knee cellulitis To complete course of doxycycline on 06/12/14. Monitor for fever and wbc elevation  Constipation Continue colace 100 mg bid with miralax prn with dulcolax suppository and prune juice, stable at present  HTN bp stable,  continue cardura 4 mg daily and lopressor 50 mg am and 25 mg pm. Dosing verified with pt and family  gerd Continue protonix 40 mg daily, monitor clinically  BPH Continue finasteride 6 mg daily  HLD On zocor and lipitor. I don't see benefit of patient being on both. Will d/c zocor for now and continue lipitor  Goals of care: short term rehabilitation   Labs/tests ordered: cbc, cmp    Oneal GroutMAHIMA Bartosz Luginbill, MD  [facility] [phone] (Monday-Friday 8 am - 5 pm) [phone] (afterhours)

## 2014-06-21 ENCOUNTER — Encounter: Payer: Self-pay | Admitting: Adult Health

## 2014-06-21 ENCOUNTER — Non-Acute Institutional Stay (SKILLED_NURSING_FACILITY): Payer: Commercial Managed Care - HMO | Admitting: Adult Health

## 2014-06-21 DIAGNOSIS — N400 Enlarged prostate without lower urinary tract symptoms: Secondary | ICD-10-CM

## 2014-06-21 DIAGNOSIS — I10 Essential (primary) hypertension: Secondary | ICD-10-CM

## 2014-06-21 DIAGNOSIS — E785 Hyperlipidemia, unspecified: Secondary | ICD-10-CM

## 2014-06-21 DIAGNOSIS — M1711 Unilateral primary osteoarthritis, right knee: Secondary | ICD-10-CM

## 2014-06-21 DIAGNOSIS — K5900 Constipation, unspecified: Secondary | ICD-10-CM

## 2014-06-21 DIAGNOSIS — K219 Gastro-esophageal reflux disease without esophagitis: Secondary | ICD-10-CM

## 2014-06-21 DIAGNOSIS — I25118 Atherosclerotic heart disease of native coronary artery with other forms of angina pectoris: Secondary | ICD-10-CM

## 2014-06-21 DIAGNOSIS — F419 Anxiety disorder, unspecified: Secondary | ICD-10-CM

## 2014-06-21 NOTE — Progress Notes (Signed)
Patient ID: Patrick LopesCarmelo Knapp, male   DOB: 1928-04-03, 80 y.o.   MRN: [id number]   06/21/14  Facility:  Nursing Home Location:  [facility] Room Number: 702-P LEVEL OF CARE:  SNF (31)   Chief Complaint  Patient presents with  . Discharge Note    Osteoarthritis S/P right total knee arthroplasty, hypertension, hyperlipidemia, constipation, BPH, GERD, anxiety and CAD    HISTORY OF PRESENT ILLNESS:  This is an 80 year old male who is for discharge home and will have outpatient rehabilitation. [facility] was [facility]14 [facility] with osteoarthritis status post right total knee arthroplasty. Patient was admitted to this facility for short-term rehabilitation after the patient's recent hospitalization.  Patient has completed SNF rehabilitation and therapy has cleared the patient for discharge.  REASSESSMENT OF ONGOING PROBLEMS:  HTN: Pt 's HTN remains stable.  Denies CP, sob, DOE, headaches, dizziness or visual disturbances.  No complications from the medications currently being used.  Last BP : 129/73  HYPERLIPIDEMIA: No complications from the medications presently being used. 9/15 fasting lipid panel showed : Cholesterol 115 and triglycerides 142.0 HDL 37.20 LDL 49  CAD: The angina has been stable. The patient denies dyspnea on exertion, orthopnea, pedal edema, palpitations and paroxysmal nocturnal dyspnea. No complications noted from the medication presently being used.   PAST MEDICAL HISTORY:  Past Medical History  Diagnosis Date  . Coronary artery disease     post PTCA and stenting of his left circumflex artery in 12/08  . Hypertension   . Hyperlipidemia   . Ischemia   . Shortness of breath   . Enlarged prostate   . GERD (gastroesophageal reflux disease)   . Arthritis   . Skin disorder     Occurs at times as rash, whelps, blister-like areas  with itching sometimes-has occurred for years , but can not find an exact  dx or cause    CURRENT MEDICATIONS: Reviewed per MAR/see medication list  Allergies  Allergen Reactions  . Chlorhexidine Gluconate Itching  . Iodinated Diagnostic Agents     Not really sure of the incident     REVIEW OF SYSTEMS:  GENERAL: no change in appetite, no fatigue, no weight changes, no fever, chills or weakness RESPIRATORY: no cough, SOB, DOE, wheezing, hemoptysis CARDIAC: no chest pain, or palpitations GI: no abdominal pain, diarrhea, constipation, heart burn, nausea or vomiting  PHYSICAL EXAMINATION  GENERAL: no acute distress, normal body habitus SKIN:  Right knee surgical incision is dry, no erythema EYES: conjunctivae normal, sclerae normal, normal eye lids NECK: supple, trachea midline, no neck masses, no thyroid tenderness, no thyromegaly LYMPHATICS: no LAN in the neck, no supraclavicular LAN RESPIRATORY: breathing is even & unlabored, BS CTAB CARDIAC: RRR, no murmur,no extra heart sounds, no edema GI: abdomen soft, normal BS, no masses, no tenderness, no hepatomegaly, no splenomegaly EXTREMITIES: Able to move all 4 extremities PSYCHIATRIC: the patient is alert & oriented to person, affect & behavior appropriate  LABS/RADIOLOGY: Labs reviewed: Basic Metabolic Panel:  Recent Labs  40/98/1111/16/15 0900 06/01/14 0435 06/02/14 0422  NA 140 141 143  K 4.5 3.9 4.4  CL 103 106 111  CO2 23 20 23   GLUCOSE 101* 135* 176*  BUN 24* 22 23  CREATININE 1.24 1.24 1.25  CALCIUM 9.5 8.8 8.7   Liver Function Tests:  Recent Labs  03/17/14 0923 05/24/14 0900  AST 21 20  ALT 22 14  ALKPHOS 73 82  BILITOT 1.1 0.6  PROT 7.7 8.0  ALBUMIN 3.9 3.9    CBC:  Recent Labs  05/24/14 0900 06/01/14 0435 06/02/14 0422  WBC 6.7 11.3* 12.9*  HGB 14.3 12.2* 11.1*  HCT 43.2 36.4* 33.1*  MCV 84.9 84.3 84.9  PLT 140* 142* 138*   Lipid Panel:  Recent Labs  03/17/14 0923  HDL 37.20*   Dg Chest 2 View  05/24/2014   CLINICAL DATA:  Preop right total knee surgery.   EXAM: CHEST  2 VIEW  COMPARISON:  Chest radiograph and CT chest 08/09/2009.  FINDINGS: Trachea is midline. Heart size is grossly stable. Pulmonary arteries appear prominent. Lungs are low in volume with a peripheral pattern of reticular nodularity, as on 08/09/2009. No focal airspace consolidation. No pleural fluid. Right hemidiaphragm is elevated, as before. Lower thoracic compression fractures appear progressive from 08/09/2009.  IMPRESSION: 1. Pulmonary parenchymal pattern of subpleural reticulonodularity, as on 08/09/2009, indicative of fibrotic interstitial lung disease. If assessment for progression is desired, high-resolution chest CT without contrast is recommended. 2. No acute findings in the chest. 3. Thoracic compression fractures are mild but appear progressive from 08/09/2009.   Electronically Signed   By: Leanna BattlesMelinda  Blietz M.D.   On: 05/24/2014 09:42    ASSESSMENT/PLAN:  Osteoarthritis S/P right total knee arthroplasty - for outpatient rehabilitation; continue oxycodone 5 mg 1-2 tabs by mouth every 3 hours when necessary and Ultram 50 mg 1-2 tabs by mouth every 6 hours when necessary for pain control, Robaxin 500 mg 1 tab by mouth every 6 hours when necessary for muscle spasm Hypertension - well controlled; continue metoprolol 50 mg by mouth every morning and 25 mg by mouth every evening Constipation - stable; continue Reguloid and Colace Hyperlipidemia - continue Zocor 40 mg by mouth daily BPH - continue Cardura 4 mg by mouth every morning and Proscar 1 mg and 5 mg by mouth daily GERD - continue Protonix 40 mg 1 tab by mouth daily at bedtime Anxiety - mood is stable; continue Xanax 1 mg 1 tab by mouth twice a day when necessary CAD - will discontinue Xarelto upon discharge and resume Plavix 75 mg by mouth daily   I have filled out patient's discharge paperwork and written prescriptions.  Patient will have outpatient rehabilitation.  Total discharge time:  Less than 30  minutes  Discharge time involved coordination of the discharge process with Child psychotherapistsocial worker, nursing staff and therapy department.     [facility]ARG[facility],MONINA, NP [facility] [phone]

## 2014-06-21 NOTE — Progress Notes (Addendum)
Patient ID: Patrick Knapp, male   DOB: [birth year], 80 y.o.   MRN: [id number]   06/08/14  Facility:  Nursing Home Location:  [facility] Room Number: 702-P LEVEL OF CARE:  SNF (31)   Chief Complaint  Patient presents with  . Hospitalization Follow-up    Osteoarthritis S/P right total knee arthroplasty, hypertension, constipation, hyperlipidemia, BPH, GERD, anxiety and CAD    HISTORY OF PRESENT ILLNESS:  This is an 80 year old male who was been admitted [facility]02[facility] with osteoarthritis status post right total knee arthroplasty. He has been admitted for a short-term rehabilitation.  REASSESSMENT OF ONGOING PROBLEMS:  HTN: Pt 's HTN remains stable.  Denies CP, sob, DOE, headaches, dizziness or visual disturbances.  No complications from the medications currently being used.  Last BP : 134/82  GERD: pt's GERD is stable.  Denies ongoing heartburn, abd. Pain, nausea or vomiting.  Currently on a PPI & tolerates it without any adverse reactions.  ANXIETY: The anxiety remains stable. Patient denies ongoing anxiety or irritability. No complications reported from the medications currently being used.   PAST MEDICAL HISTORY:  Past Medical History  Diagnosis Date  . Coronary artery disease     post PTCA and stenting of his left circumflex artery in 12/08  . Hypertension   . Hyperlipidemia   . Ischemia   . Shortness of breath   . Enlarged prostate   . GERD (gastroesophageal reflux disease)   . Arthritis   . Skin disorder     Occurs at times as rash, whelps, blister-like areas  with itching sometimes-has occurred for years , but can not find an exact dx or cause    CURRENT MEDICATIONS: Reviewed per MAR/see medication list  Allergies  Allergen Reactions  . Chlorhexidine Gluconate Itching  . Iodinated Diagnostic Agents     Not really sure of the incident     REVIEW OF SYSTEMS:  GENERAL: no change in  appetite, no fatigue, no weight changes, no fever, chills or weakness RESPIRATORY: no cough, SOB, DOE, wheezing, hemoptysis CARDIAC: no chest pain, or palpitations GI: no abdominal pain, diarrhea, constipation, heart burn, nausea or vomiting  PHYSICAL EXAMINATION  GENERAL: no acute distress, normal body habitus SKIN:  Right knee surgical incision has erythema, tender and warm to touch EYES: conjunctivae normal, sclerae normal, normal eye lids NECK: supple, trachea midline, no neck masses, no thyroid tenderness, no thyromegaly LYMPHATICS: no LAN in the neck, no supraclavicular LAN RESPIRATORY: breathing is even & unlabored, BS CTAB CARDIAC: RRR, no murmur,no extra heart sounds, no edema GI: abdomen soft, normal BS, no masses, no tenderness, no hepatomegaly, no splenomegaly EXTREMITIES: Able to move all 4 extremities PSYCHIATRIC: the patient is alert & oriented to person, affect & behavior appropriate  LABS/RADIOLOGY: Labs reviewed: Basic Metabolic Panel:  Recent Labs  78/29/5611/16/15 0900 06/01/14 0435 06/02/14 0422  NA 140 141 143  K 4.5 3.9 4.4  CL 103 106 111  CO2 23 20 23   GLUCOSE 101* 135* 176*  BUN 24* 22 23  CREATININE 1.24 1.24 1.25  CALCIUM 9.5 8.8 8.7   Liver Function Tests:  Recent Labs  03/17/14 0923 05/24/14 0900  AST 21 20  ALT 22 14  ALKPHOS 73 82  BILITOT 1.1 0.6  PROT 7.7 8.0  ALBUMIN 3.9 3.9   CBC:  Recent Labs  05/24/14 0900 06/01/14 0435 06/02/14 0422  WBC 6.7 11.3* 12.9*  HGB 14.3 12.2* 11.1*  HCT  43.2 36.4* 33.1*  MCV 84.9 84.3 84.9  PLT 140* 142* 138*   Lipid Panel:  Recent Labs  03/17/14 0923  HDL 37.20*   Dg Chest 2 View  05/24/2014   CLINICAL DATA:  Preop right total knee surgery.  EXAM: CHEST  2 VIEW  COMPARISON:  Chest radiograph and CT chest 08/09/2009.  FINDINGS: Trachea is midline. Heart size is grossly stable. Pulmonary arteries appear prominent. Lungs are low in volume with a peripheral pattern of reticular nodularity, as  on 08/09/2009. No focal airspace consolidation. No pleural fluid. Right hemidiaphragm is elevated, as before. Lower thoracic compression fractures appear progressive from 08/09/2009.  IMPRESSION: 1. Pulmonary parenchymal pattern of subpleural reticulonodularity, as on 08/09/2009, indicative of fibrotic interstitial lung disease. If assessment for progression is desired, high-resolution chest CT without contrast is recommended. 2. No acute findings in the chest. 3. Thoracic compression fractures are mild but appear progressive from 08/09/2009.   Electronically Signed   By: Leanna BattlesMelinda  Blietz M.D.   On: 05/24/2014 09:42    ASSESSMENT/PLAN:  Osteoarthritis S/P right total knee arthroplasty - for rehabilitation Cellulitis of right knee - continue doxycycline 100 mg by mouth twice a day for a total of 7 days Hypertension - well controlled; continue metoprolol 50 mg by mouth every morning and 25 mg by mouth every evening Constipation - stable; continue Reguloid and Colace Hyperlipidemia - continue Zocor 40 mg by mouth daily BPH - continue Cardura 4 mg by mouth every morning and Proscar 1 mg and 5 mg by mouth daily GERD - continue Protonix 40 mg 1 tab by mouth daily at bedtime Anxiety - mood is stable; continue Xanax 1 mg 1 tab by mouth twice a day when necessary CAD - currently on Xarelto; Plavix on hold   Spent 50 minutes in patient care.     [facility],MONINA, NP [facility] [phone]

## 2014-07-09 HISTORY — PX: CATARACT EXTRACTION, BILATERAL: SHX1313

## 2014-09-22 DIAGNOSIS — E785 Hyperlipidemia, unspecified: Secondary | ICD-10-CM | POA: Diagnosis not present

## 2014-09-22 DIAGNOSIS — Z125 Encounter for screening for malignant neoplasm of prostate: Secondary | ICD-10-CM | POA: Diagnosis not present

## 2014-09-22 DIAGNOSIS — M859 Disorder of bone density and structure, unspecified: Secondary | ICD-10-CM | POA: Diagnosis not present

## 2014-09-22 DIAGNOSIS — I2510 Atherosclerotic heart disease of native coronary artery without angina pectoris: Secondary | ICD-10-CM | POA: Diagnosis not present

## 2014-09-24 DIAGNOSIS — S22009A Unspecified fracture of unspecified thoracic vertebra, initial encounter for closed fracture: Secondary | ICD-10-CM | POA: Diagnosis not present

## 2014-09-24 DIAGNOSIS — Z96651 Presence of right artificial knee joint: Secondary | ICD-10-CM | POA: Diagnosis not present

## 2014-09-24 DIAGNOSIS — G4700 Insomnia, unspecified: Secondary | ICD-10-CM | POA: Diagnosis not present

## 2014-09-24 DIAGNOSIS — Z0000 Encounter for general adult medical examination without abnormal findings: Secondary | ICD-10-CM | POA: Diagnosis not present

## 2014-09-24 DIAGNOSIS — E291 Testicular hypofunction: Secondary | ICD-10-CM | POA: Diagnosis not present

## 2014-09-24 DIAGNOSIS — I2510 Atherosclerotic heart disease of native coronary artery without angina pectoris: Secondary | ICD-10-CM | POA: Diagnosis not present

## 2014-09-24 DIAGNOSIS — N400 Enlarged prostate without lower urinary tract symptoms: Secondary | ICD-10-CM | POA: Diagnosis not present

## 2014-09-24 DIAGNOSIS — M859 Disorder of bone density and structure, unspecified: Secondary | ICD-10-CM | POA: Diagnosis not present

## 2014-10-07 DIAGNOSIS — Z1212 Encounter for screening for malignant neoplasm of rectum: Secondary | ICD-10-CM | POA: Diagnosis not present

## 2014-12-15 DIAGNOSIS — H6123 Impacted cerumen, bilateral: Secondary | ICD-10-CM | POA: Diagnosis not present

## 2015-10-13 DIAGNOSIS — E784 Other hyperlipidemia: Secondary | ICD-10-CM | POA: Diagnosis not present

## 2015-10-13 DIAGNOSIS — M859 Disorder of bone density and structure, unspecified: Secondary | ICD-10-CM | POA: Diagnosis not present

## 2015-10-13 DIAGNOSIS — Z125 Encounter for screening for malignant neoplasm of prostate: Secondary | ICD-10-CM | POA: Diagnosis not present

## 2015-10-13 DIAGNOSIS — N400 Enlarged prostate without lower urinary tract symptoms: Secondary | ICD-10-CM | POA: Diagnosis not present

## 2015-10-18 DIAGNOSIS — I25119 Atherosclerotic heart disease of native coronary artery with unspecified angina pectoris: Secondary | ICD-10-CM | POA: Diagnosis not present

## 2015-10-18 DIAGNOSIS — Z0000 Encounter for general adult medical examination without abnormal findings: Secondary | ICD-10-CM | POA: Diagnosis not present

## 2015-10-18 DIAGNOSIS — K219 Gastro-esophageal reflux disease without esophagitis: Secondary | ICD-10-CM | POA: Diagnosis not present

## 2015-10-18 DIAGNOSIS — R0609 Other forms of dyspnea: Secondary | ICD-10-CM | POA: Diagnosis not present

## 2015-10-18 DIAGNOSIS — K635 Polyp of colon: Secondary | ICD-10-CM | POA: Diagnosis not present

## 2015-10-18 DIAGNOSIS — G4700 Insomnia, unspecified: Secondary | ICD-10-CM | POA: Diagnosis not present

## 2015-10-18 DIAGNOSIS — N400 Enlarged prostate without lower urinary tract symptoms: Secondary | ICD-10-CM | POA: Diagnosis not present

## 2015-10-18 DIAGNOSIS — E784 Other hyperlipidemia: Secondary | ICD-10-CM | POA: Diagnosis not present

## 2015-10-18 DIAGNOSIS — M8580 Other specified disorders of bone density and structure, unspecified site: Secondary | ICD-10-CM | POA: Diagnosis not present

## 2016-01-11 ENCOUNTER — Telehealth: Payer: Self-pay | Admitting: Cardiovascular Disease

## 2016-01-11 NOTE — Telephone Encounter (Signed)
Pt calling to set up Cardioversion as soon as possible-pls call

## 2016-01-12 NOTE — Telephone Encounter (Signed)
Follow up   Pt verbalized that he is disappointed that he did not get a call back yesterday  He wants to schedule his Cardioversion

## 2016-01-12 NOTE — Telephone Encounter (Signed)
Spoke with patient who states he needs to see Dr. Elease HashimotoNahser to set up a cardiac cath.  I asked him about symptoms since he has not been seen in the office since 9/15.  He states he has worsening DOE and notices that he gives out of energy more easily.  He has a hx of stenting in 2008.  He states he feels like he did prior to getting that stent.  I offered him an appointment for tomorrow with Dr. Elease HashimotoNahser.  He states he is going out of town for a week by airplane and cannot come in until 7/17.  I have scheduled him to see Dr. Elease HashimotoNahser on that day and advised him that if symptoms worsen, to go to the nearest ED.  He states his daughter is a Engineer, civil (consulting)nurse and she is keeping a watchful eye on him.  He thanked me for the call.

## 2016-01-23 ENCOUNTER — Ambulatory Visit (INDEPENDENT_AMBULATORY_CARE_PROVIDER_SITE_OTHER): Payer: Commercial Managed Care - HMO | Admitting: Cardiovascular Disease

## 2016-01-23 ENCOUNTER — Encounter: Payer: Self-pay | Admitting: Cardiovascular Disease

## 2016-01-23 ENCOUNTER — Encounter (INDEPENDENT_AMBULATORY_CARE_PROVIDER_SITE_OTHER): Payer: Self-pay

## 2016-01-23 VITALS — BP 110/70 | HR 54 | Ht 68.0 in | Wt 199.8 lb

## 2016-01-23 DIAGNOSIS — I10 Essential (primary) hypertension: Secondary | ICD-10-CM | POA: Diagnosis not present

## 2016-01-23 DIAGNOSIS — R0609 Other forms of dyspnea: Secondary | ICD-10-CM

## 2016-01-23 DIAGNOSIS — I25118 Atherosclerotic heart disease of native coronary artery with other forms of angina pectoris: Secondary | ICD-10-CM

## 2016-01-23 NOTE — Progress Notes (Signed)
Patrick Knapp Date of Birth  03-May-1928 [facility] [address]    [address] [place], [place]  [address] [phone]  Fax  [phone]  [facility] [address] [place], [place]  [address] [phone]   Fax [phone]  Problem list: 1. Coronary artery disease 2. Hypertension 3. Hyperlipidemia 4. BPH  History of Present Illness:  Patrick Knapp is an 82 yo with a hx of CAD, HTN, hyperlipidemia, and BPH.  He is not had any episodes of chest pain or shortness breath. He's exploring some alternate therapies for his BPH including cryotherapy.  He works at [employer] for 4 hours every day. He does not get any other regular exercise.  Sept. 9, 2015:  Patrick Knapp has had some DOE walking through the airport terminal .   No CP -   Similar to how he felt prior to stenting  He has a right knee injury - needs knee replacement on Nov. 23. ( [place])  Has not been able to exercise.    January 23, 2016:  Patrick Knapp is seen today after a 2 year absence.   Seen with his daughter, [name]  He complains of lots of fatigue. His last Myoview study was 03/25/2016. He had no evidence of ischemia at that time and had normal left ventricle systolic function.  Has severe exhaustion with any exertion .   Has lots of DOE   thinks that he feels similar to when he had his stent placed . He has never had CP - even prior to stenting  His DOE seems to be worsening   DOE even walking to his mail box   Current Outpatient Prescriptions on File Prior to Visit  Medication Sig Dispense Refill  . acetaminophen (TYLENOL) 325 MG tablet Take 2 tablets (650 mg total) by mouth every 6 (six) hours as needed for mild pain (or Fever >/= 101). 40 tablet 0  . atorvastatin (LIPITOR) 20 MG tablet Take 20 mg by mouth daily.     Marland Kitchen docusate sodium 100 MG CAPS Take 100 mg by mouth 2 (two) times daily. 10 capsule 0  . doxazosin (CARDURA) 4 MG tablet Take 4 mg by mouth every morning.     . finasteride (PROSCAR) 5 MG  tablet Take 5 mg by mouth daily.    . magnesium gluconate (MAGONATE) 500 MG tablet Take 500 mg by mouth daily.    . pantoprazole (PROTONIX) 40 MG tablet Take 40 mg by mouth daily. DAILY AT HS     No current facility-administered medications on file prior to visit.    Allergies  Allergen Reactions  . Chlorhexidine Gluconate Itching  . Iodinated Diagnostic Agents     Not really sure of the incident    Past Medical History  Diagnosis Date  . Coronary artery disease     post PTCA and stenting of his left circumflex artery in 12/08  . Hypertension   . Hyperlipidemia   . Ischemia   . Shortness of breath   . Enlarged prostate   . GERD (gastroesophageal reflux disease)   . Arthritis   . Skin disorder     Occurs at times as rash, whelps, blister-like areas  with itching sometimes-has occurred for years , but can not find an exact dx or cause    Past Surgical History  Procedure Laterality Date  . Cardiac catheterization  06/2007    Est. EF of 55-60%PTCA and stenting of his left circumflex artery in 12/08  . Knee  arthroscopy      SURGERY FOR FRACTURE OF LEFT EYE SOCKET  . Eye surgery  OCT 2015    LEFT CATARACT EXTRACTION   . Coronary angioplasty      STENTING OF HEART  . Total knee arthroplasty Right 05/31/2014    Procedure: RIGHT TOTAL KNEE ARTHROPLASTY;  Surgeon: Loanne DrillingFrank Aluisio V, MD;  Location: [facility];  Service: Orthopedics;  Laterality: Right;    History  Smoking status  . Never Smoker   Smokeless tobacco  . Never Used    History  Alcohol Use  . Yes    Comment: OCCAS GLASS OF WINE    Family History  Problem Relation Age of Onset  . Heart failure Mother     possible  . Heart disease Mother   . Coronary artery disease Brother   . Heart disease Brother   . Heart disease Father     Reviw of Systems:  Reviewed in the HPI.  All other systems are negative.  Physical Exam: BP 110/70 mmHg  Pulse 54  Ht 5\' 8"  (1.727 m)  Wt 199 lb 12.8 oz (90.629 kg)  BMI 30.39  kg/m2 The patient is alert and oriented x 3.  The mood and affect are normal.   Skin: warm and dry.  Color is normal.    HEENT:   Normocephalic/atraumatic. He is normal carotids. There is no JVD.  Lungs: Lungs are clear to auscultation.   Heart: Regular rate S1-S2.    Abdomen: Normal bowel sounds. There is no hepatosplenomegaly  Extremities:  No clubbing cyanosis or edema.  Neuro:  Nonfocal. Exam is normal.    ECG: January 23, 2016:   Sinus brady at 5154.   Inc. RBBB  Assessment / Plan:    1. Coronary artery disease: Is having some DOE , exhausting with any exertion  Will get a [employer] myoview and an echo .    He has never had any episodes of chest pain-even before his stent procedure. He may need a cardiac catheterization.  2. Hypertension - BP is well controlled.   3. Hyperlipidemia -   4. BPH    Kristeen MissPhilip Nahser, MD  01/23/2016 1:58 PM    [facility] [address]  [address] [place], [place]  [address] Pager [phone] Phone: [phone]) [phone]; Fax: [phone]

## 2016-01-23 NOTE — Patient Instructions (Signed)
Medication Instructions:  Your physician recommends that you continue on your current medications as directed. Please refer to the Current Medication list given to you today.   Labwork: None Ordered   Testing/Procedures: Your physician has requested that you have an echocardiogram. Echocardiography is a painless test that uses sound waves to create images of your heart. It provides your doctor with information about the size and shape of your heart and how well your heart's chambers and valves are working. This procedure takes approximately one hour. There are no restrictions for this procedure.  Your physician has requested that you have a lexiscan myoview. For further information please visit www.cardiosmart.org. Please follow instruction sheet, as given.   Follow-Up: Your physician recommends that you schedule a follow-up appointment in: 3 months with Dr. Nahser   If you need a refill on your cardiac medications before your next appointment, please call your pharmacy.   Thank you for choosing [facility]! Jonice Cerra, RN [phone]    

## 2016-01-26 ENCOUNTER — Telehealth (HOSPITAL_COMMUNITY): Payer: Self-pay | Admitting: *Deleted

## 2016-01-26 NOTE — Telephone Encounter (Signed)
Left message on voicemail in reference to upcoming appointment scheduled for 01/31/16. Phone number given for a call back so details instructions can be given. Antionette CharMary J Dhwani Venkatesh, RN

## 2016-01-30 ENCOUNTER — Telehealth (HOSPITAL_COMMUNITY): Payer: Self-pay | Admitting: *Deleted

## 2016-01-30 ENCOUNTER — Telehealth (HOSPITAL_COMMUNITY): Payer: Self-pay | Admitting: Radiology

## 2016-01-30 NOTE — Telephone Encounter (Signed)
Patient given detailed instructions per Myocardial Perfusion Study Information Sheet for the test on 01/31/16 at 715. Patient notified to arrive 15 minutes early and that it is imperative to arrive on time for appointment to keep from having the test rescheduled.  If you need to cancel or reschedule your appointment, please call the office within 24 hours of your appointment. Failure to do so may result in a cancellation of your appointment, and a $50 no show fee. Patient verbalized understanding.Hurshel Party

## 2016-01-31 ENCOUNTER — Ambulatory Visit (HOSPITAL_COMMUNITY): Payer: Commercial Managed Care - HMO | Attending: Cardiology

## 2016-01-31 ENCOUNTER — Encounter (INDEPENDENT_AMBULATORY_CARE_PROVIDER_SITE_OTHER): Payer: Self-pay

## 2016-01-31 DIAGNOSIS — R0609 Other forms of dyspnea: Secondary | ICD-10-CM

## 2016-01-31 DIAGNOSIS — I25118 Atherosclerotic heart disease of native coronary artery with other forms of angina pectoris: Secondary | ICD-10-CM | POA: Insufficient documentation

## 2016-01-31 DIAGNOSIS — I10 Essential (primary) hypertension: Secondary | ICD-10-CM | POA: Diagnosis not present

## 2016-01-31 LAB — MYOCARDIAL PERFUSION IMAGING
CHL CUP NUCLEAR SDS: 1
CHL CUP RESTING HR STRESS: 47 {beats}/min
LV dias vol: 85 mL (ref 62–150)
LV sys vol: 32 mL
NUC STRESS TID: 0.72
Peak HR: 81 {beats}/min
RATE: 0.4
SRS: 1
SSS: 2

## 2016-01-31 MED ORDER — TECHNETIUM TC 99M TETROFOSMIN IV KIT
32.6000 | PACK | Freq: Once | INTRAVENOUS | Status: AC | PRN
  Administered 2016-01-31: 33 via INTRAVENOUS
  Filled 2016-01-31: qty 33

## 2016-01-31 MED ORDER — REGADENOSON 0.4 MG/5ML IV SOLN
0.4000 mg | Freq: Once | INTRAVENOUS | Status: AC
  Administered 2016-01-31: 0.4 mg via INTRAVENOUS

## 2016-01-31 MED ORDER — TECHNETIUM TC 99M TETROFOSMIN IV KIT
10.1000 | PACK | Freq: Once | INTRAVENOUS | Status: AC | PRN
  Administered 2016-01-31: 10.1 via INTRAVENOUS
  Filled 2016-01-31: qty 10

## 2016-02-02 ENCOUNTER — Encounter: Payer: Self-pay | Admitting: Nurse Practitioner

## 2016-02-02 ENCOUNTER — Other Ambulatory Visit: Payer: Self-pay | Admitting: Cardiovascular Disease

## 2016-02-02 ENCOUNTER — Telehealth: Payer: Self-pay | Admitting: Cardiovascular Disease

## 2016-02-02 DIAGNOSIS — I25119 Atherosclerotic heart disease of native coronary artery with unspecified angina pectoris: Secondary | ICD-10-CM

## 2016-02-02 DIAGNOSIS — I25118 Atherosclerotic heart disease of native coronary artery with other forms of angina pectoris: Secondary | ICD-10-CM

## 2016-02-02 NOTE — Telephone Encounter (Signed)
Reviewed myoview results with patient and daughter, [name].  Patient states he continues to have exhaustion with walking even short distance with slightest incline.  He denies chest discomfort with exertion.  He and daughter are concerned that symptoms continue despite normal results.  I explained that there are occasions where results can be reported as normal despite blockages and that a cardiac cath would be necessary to define anatomy.  I advised that previous echo from 2008 also reveals mild AI and mild MR and TR.  I advised that I will discuss with Dr. Elease Hashimoto and call them back with his advice.  Patient and daughter thanked me for the call.

## 2016-02-02 NOTE — Telephone Encounter (Signed)
Spoke with patient's daughter, [name], who is in agreement to schedule patient for cardiac cath.  I advised her I will call back with details once cath is scheduled.  She also requests new handicap placard as patient's has expired.  I advised her I will ask one of our APP's to sign since Dr. Elease Hashimoto is out of the office this week.  Advised patient's daughter that left and right heart cath is scheduled for Monday 7/31 at 1030 with Dr. Herbie Baltimore.  She asked if Dr. Excell Seltzer is able to perform the procedure.  I advised that I will have to call back and ask.  I spoke with Clydie Braun who scheduled patient with Dr. Excell Seltzer at 1030 on 7/31.  Patient's daughter aware and thanked me for making the change.  I advised of lab appointment tomorrow at our office and reviewed pre-cath instructions with her.  I advised that I am placing a copy of the instructions as well as the signed handicap placard (signed by Carlean Jews, PA) at the front desk for pick up tomorrow.  I advised her to call back with questions or concerns prior to Monday.  She thanked me for the call.

## 2016-02-02 NOTE — Telephone Encounter (Signed)
Follow Up:    Can she can come over and pick up a handicap parking placard pleas?His have expired.

## 2016-02-02 NOTE — Telephone Encounter (Signed)
New Message  Pt returning phone call from yesterday about test results.

## 2016-02-02 NOTE — Telephone Encounter (Signed)
Agree with notes from Ilene Qua, RN His symptoms are similar to his previous symptoms . Will arrange a right and left heart caht

## 2016-02-03 ENCOUNTER — Other Ambulatory Visit: Payer: Commercial Managed Care - HMO | Admitting: *Deleted

## 2016-02-03 ENCOUNTER — Encounter: Payer: Self-pay | Admitting: *Deleted

## 2016-02-03 DIAGNOSIS — I25118 Atherosclerotic heart disease of native coronary artery with other forms of angina pectoris: Secondary | ICD-10-CM

## 2016-02-03 LAB — BASIC METABOLIC PANEL
BUN: 25 mg/dL (ref 7–25)
CALCIUM: 9.3 mg/dL (ref 8.6–10.3)
CHLORIDE: 107 mmol/L (ref 98–110)
CO2: 24 mmol/L (ref 20–31)
Creat: 1.39 mg/dL — ABNORMAL HIGH (ref 0.70–1.11)
GLUCOSE: 108 mg/dL — AB (ref 65–99)
Potassium: 4.3 mmol/L (ref 3.5–5.3)
SODIUM: 140 mmol/L (ref 135–146)

## 2016-02-03 LAB — CBC
HCT: 42.2 % (ref 38.5–50.0)
HEMOGLOBIN: 14 g/dL (ref 13.2–17.1)
MCH: 28.5 pg (ref 27.0–33.0)
MCHC: 33.2 g/dL (ref 32.0–36.0)
MCV: 85.8 fL (ref 80.0–100.0)
MPV: 10.3 fL (ref 7.5–12.5)
Platelets: 127 10*3/uL — ABNORMAL LOW (ref 140–400)
RBC: 4.92 MIL/uL (ref 4.20–5.80)
RDW: 14.3 % (ref 11.0–15.0)
WBC: 6.7 10*3/uL (ref 3.8–10.8)

## 2016-02-03 LAB — PROTIME-INR
INR: 1.1
Prothrombin Time: 11.6 s — ABNORMAL HIGH (ref 9.0–11.5)

## 2016-02-06 ENCOUNTER — Other Ambulatory Visit: Payer: Self-pay

## 2016-02-06 ENCOUNTER — Other Ambulatory Visit (HOSPITAL_COMMUNITY): Payer: Commercial Managed Care - HMO

## 2016-02-06 ENCOUNTER — Encounter (HOSPITAL_COMMUNITY)
Admission: RE | Disposition: A | Discharge status: Home / Self Care | Payer: Self-pay | Source: Ambulatory Visit | Attending: Cardiology

## 2016-02-06 ENCOUNTER — Encounter (HOSPITAL_COMMUNITY): Payer: Self-pay | Admitting: Cardiovascular Disease

## 2016-02-06 ENCOUNTER — Ambulatory Visit (HOSPITAL_COMMUNITY)
Admission: RE | Admit: 2016-02-06 | Discharge: 2016-02-07 | Disposition: A | Discharge status: Home / Self Care | Payer: Commercial Managed Care - HMO | Source: Ambulatory Visit | Attending: Cardiology | Admitting: Cardiology

## 2016-02-06 DIAGNOSIS — I2510 Atherosclerotic heart disease of native coronary artery without angina pectoris: Secondary | ICD-10-CM

## 2016-02-06 DIAGNOSIS — I10 Essential (primary) hypertension: Secondary | ICD-10-CM | POA: Diagnosis not present

## 2016-02-06 DIAGNOSIS — I2089 Other forms of angina pectoris: Secondary | ICD-10-CM | POA: Diagnosis present

## 2016-02-06 DIAGNOSIS — N400 Enlarged prostate without lower urinary tract symptoms: Secondary | ICD-10-CM | POA: Insufficient documentation

## 2016-02-06 DIAGNOSIS — Z955 Presence of coronary angioplasty implant and graft: Secondary | ICD-10-CM | POA: Insufficient documentation

## 2016-02-06 DIAGNOSIS — I2584 Coronary atherosclerosis due to calcified coronary lesion: Secondary | ICD-10-CM | POA: Insufficient documentation

## 2016-02-06 DIAGNOSIS — Z96651 Presence of right artificial knee joint: Secondary | ICD-10-CM | POA: Diagnosis not present

## 2016-02-06 DIAGNOSIS — K219 Gastro-esophageal reflux disease without esophagitis: Secondary | ICD-10-CM | POA: Diagnosis not present

## 2016-02-06 DIAGNOSIS — I25119 Atherosclerotic heart disease of native coronary artery with unspecified angina pectoris: Secondary | ICD-10-CM | POA: Diagnosis not present

## 2016-02-06 DIAGNOSIS — I208 Other forms of angina pectoris: Secondary | ICD-10-CM | POA: Diagnosis present

## 2016-02-06 DIAGNOSIS — E785 Hyperlipidemia, unspecified: Secondary | ICD-10-CM | POA: Diagnosis present

## 2016-02-06 DIAGNOSIS — R931 Abnormal findings on diagnostic imaging of heart and coronary circulation: Secondary | ICD-10-CM | POA: Diagnosis present

## 2016-02-06 DIAGNOSIS — Z8249 Family history of ischemic heart disease and other diseases of the circulatory system: Secondary | ICD-10-CM | POA: Insufficient documentation

## 2016-02-06 HISTORY — PX: CARDIAC CATHETERIZATION: SHX172

## 2016-02-06 LAB — POCT I-STAT 3, VENOUS BLOOD GAS (G3P V)
Acid-base deficit: 3 mmol/L — ABNORMAL HIGH (ref 0.0–2.0)
Bicarbonate: 23.2 meq/L (ref 20.0–24.0)
O2 Saturation: 72 %
TCO2: 25 mmol/L (ref 0–100)
pCO2, Ven: 43.2 mmHg — ABNORMAL LOW (ref 45.0–50.0)
pH, Ven: 7.339 — ABNORMAL HIGH (ref 7.250–7.300)
pO2, Ven: 40 mmHg (ref 31.0–45.0)

## 2016-02-06 LAB — POCT I-STAT 3, ART BLOOD GAS (G3+)
Acid-base deficit: 2 mmol/L (ref 0.0–2.0)
BICARBONATE: 23.8 meq/L (ref 20.0–24.0)
O2 Saturation: 97 %
PH ART: 7.336 — AB (ref 7.350–7.450)
TCO2: 25 mmol/L (ref 0–100)
pCO2 arterial: 44.4 mmHg (ref 35.0–45.0)
pO2, Arterial: 98 mmHg (ref 80.0–100.0)

## 2016-02-06 LAB — TROPONIN I: Troponin I: <0.03 ng/mL (ref <0.03)

## 2016-02-06 LAB — POCT ACTIVATED CLOTTING TIME: Activated Clotting Time: 329 s

## 2016-02-06 SURGERY — RIGHT/LEFT HEART CATH AND CORONARY ANGIOGRAPHY

## 2016-02-06 MED ORDER — LIDOCAINE HCL (PF) 1 % IJ SOLN
INTRAMUSCULAR | Status: DC | PRN
  Administered 2016-02-06 (×2): 2 mL via INTRADERMAL

## 2016-02-06 MED ORDER — VERAPAMIL HCL 2.5 MG/ML IV SOLN
INTRAVENOUS | Status: AC
  Filled 2016-02-06: qty 2

## 2016-02-06 MED ORDER — MAGNESIUM GLUCONATE 500 MG PO TABS
500.0000 mg | ORAL_TABLET | Freq: Every day | ORAL | Status: DC
  Administered 2016-02-07: 500 mg via ORAL
  Filled 2016-02-06 (×2): qty 1

## 2016-02-06 MED ORDER — CLOPIDOGREL BISULFATE 75 MG PO TABS
75.0000 mg | ORAL_TABLET | Freq: Every day | ORAL | Status: DC
  Administered 2016-02-07: 11:00:00 75 mg via ORAL
  Filled 2016-02-06: qty 1

## 2016-02-06 MED ORDER — DOXAZOSIN MESYLATE 2 MG PO TABS
2.0000 mg | ORAL_TABLET | Freq: Every morning | ORAL | Status: DC
Start: 2016-02-07 — End: 2016-02-07
  Administered 2016-02-07: 2 mg via ORAL
  Filled 2016-02-06: qty 1

## 2016-02-06 MED ORDER — SODIUM CHLORIDE 0.9 % IV SOLN: 250.0000 mL | INTRAVENOUS | Status: DC | PRN

## 2016-02-06 MED ORDER — ASPIRIN 81 MG PO CHEW
81.0000 mg | CHEWABLE_TABLET | ORAL | Status: AC
  Administered 2016-02-06: 81 mg via ORAL

## 2016-02-06 MED ORDER — METOPROLOL TARTRATE 25 MG PO TABS
50.0000 mg | ORAL_TABLET | Freq: Every day | ORAL | Status: DC
  Administered 2016-02-07: 11:00:00 50 mg via ORAL
  Filled 2016-02-06 (×2): qty 2

## 2016-02-06 MED ORDER — FENTANYL CITRATE (PF) 100 MCG/2ML IJ SOLN
INTRAMUSCULAR | Status: AC
  Filled 2016-02-06: qty 2

## 2016-02-06 MED ORDER — ANGIOPLASTY BOOK
Freq: Once | Status: AC
  Administered 2016-02-06: 23:00:00
  Filled 2016-02-06: qty 1

## 2016-02-06 MED ORDER — MIDAZOLAM HCL 2 MG/2ML IJ SOLN
INTRAMUSCULAR | Status: AC
  Filled 2016-02-06: qty 2

## 2016-02-06 MED ORDER — ACETAMINOPHEN 325 MG PO TABS: 650.0000 mg | ORAL_TABLET | Freq: Four times a day (QID) | ORAL | Status: DC | PRN

## 2016-02-06 MED ORDER — NITROGLYCERIN 1 MG/10 ML FOR IR/CATH LAB
INTRA_ARTERIAL | Status: AC
  Filled 2016-02-06: qty 10

## 2016-02-06 MED ORDER — MIDAZOLAM HCL 2 MG/2ML IJ SOLN
INTRAMUSCULAR | Status: DC | PRN
Start: 2016-02-06 — End: 2016-02-06
  Administered 2016-02-06: 1 mg via INTRAVENOUS
  Administered 2016-02-06: 2 mg via INTRAVENOUS

## 2016-02-06 MED ORDER — ATORVASTATIN CALCIUM 20 MG PO TABS
20.0000 mg | ORAL_TABLET | Freq: Every day | ORAL | Status: DC
  Administered 2016-02-06: 20 mg via ORAL
  Filled 2016-02-06: qty 1

## 2016-02-06 MED ORDER — PSYLLIUM 0.52 G PO CAPS: 0.5200 g | ORAL_CAPSULE | Freq: Every day | ORAL | Status: DC

## 2016-02-06 MED ORDER — PANTOPRAZOLE SODIUM 40 MG PO TBEC
40.0000 mg | DELAYED_RELEASE_TABLET | Freq: Every day | ORAL | Status: DC
  Administered 2016-02-06: 22:00:00 40 mg via ORAL
  Filled 2016-02-06: qty 1

## 2016-02-06 MED ORDER — SODIUM CHLORIDE 0.9 % WEIGHT BASED INFUSION
3.0000 mL/kg/h | INTRAVENOUS | Status: AC
  Administered 2016-02-06: 3 mL/kg/h via INTRAVENOUS

## 2016-02-06 MED ORDER — HEPARIN (PORCINE) IN NACL 2-0.9 UNIT/ML-% IJ SOLN
INTRAMUSCULAR | Status: DC | PRN
  Administered 2016-02-06: 10 mL via INTRA_ARTERIAL

## 2016-02-06 MED ORDER — ASPIRIN 81 MG PO CHEW
81.0000 mg | CHEWABLE_TABLET | Freq: Every day | ORAL | Status: DC
  Administered 2016-02-07: 11:00:00 81 mg via ORAL
  Filled 2016-02-06: qty 1

## 2016-02-06 MED ORDER — FENTANYL CITRATE (PF) 100 MCG/2ML IJ SOLN
INTRAMUSCULAR | Status: DC | PRN
  Administered 2016-02-06 (×2): 25 ug via INTRAVENOUS

## 2016-02-06 MED ORDER — HEPARIN SODIUM (PORCINE) 1000 UNIT/ML IJ SOLN
INTRAMUSCULAR | Status: DC | PRN
  Administered 2016-02-06 (×2): 5000 [IU] via INTRAVENOUS

## 2016-02-06 MED ORDER — IOPAMIDOL (ISOVUE-370) INJECTION 76%
INTRAVENOUS | Status: AC
  Filled 2016-02-06: qty 100

## 2016-02-06 MED ORDER — ONDANSETRON HCL 4 MG/2ML IJ SOLN: 4.0000 mg | Freq: Four times a day (QID) | INTRAMUSCULAR | Status: DC | PRN

## 2016-02-06 MED ORDER — HEPARIN SODIUM (PORCINE) 1000 UNIT/ML IJ SOLN
INTRAMUSCULAR | Status: AC
  Filled 2016-02-06: qty 1

## 2016-02-06 MED ORDER — ASPIRIN 81 MG PO CHEW
CHEWABLE_TABLET | ORAL | Status: AC
  Administered 2016-02-06: 81 mg via ORAL
  Filled 2016-02-06: qty 1

## 2016-02-06 MED ORDER — ALPRAZOLAM 0.5 MG PO TABS
0.5000 mg | ORAL_TABLET | Freq: Three times a day (TID) | ORAL | Status: DC | PRN
  Administered 2016-02-06: 22:00:00 0.5 mg via ORAL
  Filled 2016-02-06: qty 1

## 2016-02-06 MED ORDER — NITROGLYCERIN 1 MG/10 ML FOR IR/CATH LAB
INTRA_ARTERIAL | Status: DC | PRN
  Administered 2016-02-06: 200 ug
  Administered 2016-02-06: 200 ug via INTRACORONARY

## 2016-02-06 MED ORDER — PSYLLIUM 95 % PO PACK
1.0000 | PACK | Freq: Every day | ORAL | Status: DC
  Filled 2016-02-06 (×2): qty 1

## 2016-02-06 MED ORDER — IOPAMIDOL (ISOVUE-370) INJECTION 76%
INTRAVENOUS | Status: AC
  Filled 2016-02-06: qty 50

## 2016-02-06 MED ORDER — NITROGLYCERIN IN D5W 200-5 MCG/ML-% IV SOLN: 0.0000 ug/min | INTRAVENOUS | Status: DC

## 2016-02-06 MED ORDER — SODIUM CHLORIDE 0.9% FLUSH: 3.0000 mL | Freq: Two times a day (BID) | INTRAVENOUS | Status: DC

## 2016-02-06 MED ORDER — HEPARIN (PORCINE) IN NACL 2-0.9 UNIT/ML-% IJ SOLN
INTRAMUSCULAR | Status: DC | PRN
  Administered 2016-02-06: 1000 mL

## 2016-02-06 MED ORDER — HEPARIN (PORCINE) IN NACL 2-0.9 UNIT/ML-% IJ SOLN
INTRAMUSCULAR | Status: AC
  Filled 2016-02-06: qty 1000

## 2016-02-06 MED ORDER — SODIUM CHLORIDE 0.9% FLUSH: 3.0000 mL | INTRAVENOUS | Status: DC | PRN

## 2016-02-06 MED ORDER — HYDRALAZINE HCL 20 MG/ML IJ SOLN
10.0000 mg | INTRAMUSCULAR | Status: DC | PRN
  Administered 2016-02-06: 10 mg via INTRAVENOUS
  Filled 2016-02-06: qty 1

## 2016-02-06 MED ORDER — LIDOCAINE HCL (PF) 1 % IJ SOLN
INTRAMUSCULAR | Status: AC
  Filled 2016-02-06: qty 30

## 2016-02-06 MED ORDER — VITAMIN D 1000 UNITS PO TABS
1000.0000 [IU] | ORAL_TABLET | Freq: Every day | ORAL | Status: DC
  Administered 2016-02-06 – 2016-02-07 (×2): 1000 [IU] via ORAL
  Filled 2016-02-06 (×2): qty 1

## 2016-02-06 MED ORDER — SODIUM CHLORIDE 0.9 % WEIGHT BASED INFUSION
3.0000 mL/kg/h | INTRAVENOUS | Status: DC
  Administered 2016-02-06: 3 mL/kg/h via INTRAVENOUS

## 2016-02-06 MED ORDER — IOPAMIDOL (ISOVUE-370) INJECTION 76%
INTRAVENOUS | Status: DC | PRN
  Administered 2016-02-06: 105 mL via INTRA_ARTERIAL

## 2016-02-06 MED ORDER — SODIUM CHLORIDE 0.9 % WEIGHT BASED INFUSION: 1.0000 mL/kg/h | INTRAVENOUS | Status: DC

## 2016-02-06 SURGICAL SUPPLY — 19 items
BALLN ~~LOC~~ EMERGE MR 3.25X8 (BALLOONS) ×3
BALLOON ~~LOC~~ EMERGE MR 3.25X8 (BALLOONS) IMPLANT
BIOFREEDOM 3.0X14 (Permanent Stent) ×2 IMPLANT
CATH BALLN WEDGE 5F 110CM (CATHETERS) ×3 IMPLANT
CATH INFINITI 5 FR JL3.5 (CATHETERS) ×2 IMPLANT
CATH INFINITI 5FR ANG PIGTAIL (CATHETERS) ×3 IMPLANT
CATH INFINITI JR4 5F (CATHETERS) ×2 IMPLANT
CATH VISTA GUIDE 6FR XBLAD3.5 (CATHETERS) ×3 IMPLANT
CROWN DIAMONDBACK CLASSIC 1.25 (BURR) ×2 IMPLANT
DEVICE RAD COMP TR BAND LRG (VASCULAR PRODUCTS) ×2 IMPLANT
GLIDESHEATH SLEND SS 6F .021 (SHEATH) ×2 IMPLANT
KIT ENCORE 26 ADVANTAGE (KITS) ×3 IMPLANT
KIT HEART LEFT (KITS) ×3 IMPLANT
PACK CARDIAC CATHETERIZATION (CUSTOM PROCEDURE TRAY) ×3 IMPLANT
SHEATH FAST CATH BRACH 5F 5CM (SHEATH) ×2 IMPLANT
TRANSDUCER W/STOPCOCK (MISCELLANEOUS) ×6 IMPLANT
TUBING CIL FLEX 10 FLL-RA (TUBING) ×3 IMPLANT
WIRE SAFE-T 1.5MM-J .035X260CM (WIRE) ×3 IMPLANT
WIRE VIPER ADVANCE COR .012TIP (WIRE) ×3 IMPLANT

## 2016-02-06 NOTE — Care Management Note (Signed)
Case Management Note  Patient Details  Name: Patrick Knapp MRN: [id number] Date of Birth: 1927/12/20  Subjective/Objective:  Patient s/p stent intervention, on plavis, NCM will cont to follow for dc needs.                  Action/Plan:   Expected Discharge Date:                  Expected Discharge Plan:  Home/Self Care  In-House Referral:     Discharge planning Services  CM Consult  Post Acute Care Choice:    Choice offered to:     DME Arranged:    DME Agency:     HH Arranged:    HH Agency:     Status of Service:  In process, will continue to follow  If discussed at Long Length of Stay Meetings, dates discussed:    Additional Comments:  Leone Haven, RN 02/06/2016, 5:43 PM

## 2016-02-06 NOTE — H&P (View-Only) (Signed)
Patrick Knapp Date of Birth  08-Aug-1927 [facility] [address]    [address] [place], [place]  [address] [phone]  Fax  [phone]  [facility] [address] [place], [place]  [address] [phone]   Fax [phone]  Problem list: 1. Coronary artery disease 2. Hypertension 3. Hyperlipidemia 4. BPH  History of Present Illness:  Patrick Knapp is an 82 yo with a hx of CAD, HTN, hyperlipidemia, and BPH.  He is not had any episodes of chest pain or shortness breath. He's exploring some alternate therapies for his BPH including cryotherapy.  He works at [employer] for 4 hours every day. He does not get any other regular exercise.  Sept. 9, 2015:  Patrick Knapp has had some DOE walking through the airport terminal .   No CP -   Similar to how he felt prior to stenting  He has a right knee injury - needs knee replacement on Nov. 23. ( [facility])  Has not been able to exercise.    January 23, 2016:  Patrick Knapp is seen today after a 2 year absence.   Seen with his daughter, [name]  He complains of lots of fatigue. His last Myoview study was 03/25/2016. He had no evidence of ischemia at that time and had normal left ventricle systolic function.  Has severe exhaustion with any exertion .   Has lots of DOE   thinks that he feels similar to when he had his stent placed . He has never had CP - even prior to stenting  His DOE seems to be worsening   DOE even walking to his mail box   Current Outpatient Prescriptions on File Prior to Visit  Medication Sig Dispense Refill  . acetaminophen (TYLENOL) 325 MG tablet Take 2 tablets (650 mg total) by mouth every 6 (six) hours as needed for mild pain (or Fever >/= 101). 40 tablet 0  . atorvastatin (LIPITOR) 20 MG tablet Take 20 mg by mouth daily.     Marland Kitchen docusate sodium 100 MG CAPS Take 100 mg by mouth 2 (two) times daily. 10 capsule 0  . doxazosin (CARDURA) 4 MG tablet Take 4 mg by mouth every morning.     . finasteride (PROSCAR) 5 MG  tablet Take 5 mg by mouth daily.    . magnesium gluconate (MAGONATE) 500 MG tablet Take 500 mg by mouth daily.    . pantoprazole (PROTONIX) 40 MG tablet Take 40 mg by mouth daily. DAILY AT HS     No current facility-administered medications on file prior to visit.    Allergies  Allergen Reactions  . Chlorhexidine Gluconate Itching  . Iodinated Diagnostic Agents     Not really sure of the incident    Past Medical History  Diagnosis Date  . Coronary artery disease     post PTCA and stenting of his left circumflex artery in 12/08  . Hypertension   . Hyperlipidemia   . Ischemia   . Shortness of breath   . Enlarged prostate   . GERD (gastroesophageal reflux disease)   . Arthritis   . Skin disorder     Occurs at times as rash, whelps, blister-like areas  with itching sometimes-has occurred for years , but can not find an exact dx or cause    Past Surgical History  Procedure Laterality Date  . Cardiac catheterization  06/2007    Est. EF of 55-60%PTCA and stenting of his left circumflex artery in 12/08  . Knee  arthroscopy      SURGERY FOR FRACTURE OF LEFT EYE SOCKET  . Eye surgery  OCT 2015    LEFT CATARACT EXTRACTION   . Coronary angioplasty      STENTING OF HEART  . Total knee arthroplasty Right 05/31/2014    Procedure: RIGHT TOTAL KNEE ARTHROPLASTY;  Surgeon: Loanne Drilling, MD;  Location: [facility];  Service: Orthopedics;  Laterality: Right;    History  Smoking status  . Never Smoker   Smokeless tobacco  . Never Used    History  Alcohol Use  . Yes    Comment: OCCAS GLASS OF WINE    Family History  Problem Relation Age of Onset  . Heart failure Mother     possible  . Heart disease Mother   . Coronary artery disease Brother   . Heart disease Brother   . Heart disease Father     Reviw of Systems:  Reviewed in the HPI.  All other systems are negative.  Physical Exam: BP 110/70 mmHg  Pulse 54  Ht 5\' 8"  (1.727 m)  Wt 199 lb 12.8 oz (90.629 kg)  BMI 30.39  kg/m2 The patient is alert and oriented x 3.  The mood and affect are normal.   Skin: warm and dry.  Color is normal.    HEENT:   Normocephalic/atraumatic. He is normal carotids. There is no JVD.  Lungs: Lungs are clear to auscultation.   Heart: Regular rate S1-S2.    Abdomen: Normal bowel sounds. There is no hepatosplenomegaly  Extremities:  No clubbing cyanosis or edema.  Neuro:  Nonfocal. Exam is normal.    ECG: January 23, 2016:   Sinus brady at 74.   Inc. RBBB  Assessment / Plan:    1. Coronary artery disease: Is having some DOE , exhausting with any exertion  Will get a [employer] and an echo .    He has never had any episodes of chest pain-even before his stent procedure. He may need a cardiac catheterization.  2. Hypertension - BP is well controlled.   3. Hyperlipidemia -   4. BPH    Kristeen Miss, MD  01/23/2016 1:58 PM    [facility] [address] [place],  [address] [place], [place]  [address] Pager [phone] Phone: [phone]; Fax: [phone]

## 2016-02-06 NOTE — Research (Signed)
LEADERS FREE II Informed Consent   Subject Name: Patrick Knapp  Subject met inclusion and exclusion criteria.  The informed consent form, study requirements and expectations were reviewed with the subject and questions and concerns were addressed prior to the signing of the consent form.  The subject verbalized understanding of the trail requirements.  The subject agreed to participate in the LEADERS FREE II trial and signed the informed consent.  The informed consent was obtained prior to performance of any protocol-specific procedures for the subject.  A copy of the signed informed consent was given to the subject and a copy was placed in the subject's medical record.  Berneda Rose 02/06/2016, 11:14 AM

## 2016-02-06 NOTE — Interval H&P Note (Signed)
Cath Lab Visit (complete for each Cath Lab visit)  Clinical Evaluation Leading to the Procedure:   ACS: No.  Non-ACS:    Anginal Classification: CCS III  Anti-ischemic medical therapy: Minimal Therapy (1 class of medications)  Non-Invasive Test Results: Low-risk stress test findings: cardiac mortality <1%/year  Prior CABG: No previous CABG      History and Physical Interval Note:  02/06/2016 12:40 PM  Patrick Knapp  has presented today for surgery, with the diagnosis of cad w/ angina  The various methods of treatment have been discussed with the patient and family. After consideration of risks, benefits and other options for treatment, the patient has consented to  Procedure(s): Right/Left Heart Cath and Coronary Angiography (N/A) as a surgical intervention .  The patient's history has been reviewed, patient examined, no change in status, stable for surgery.  I have reviewed the patient's chart and labs.  Questions were answered to the patient's satisfaction.     Tonny Bollman

## 2016-02-07 ENCOUNTER — Telehealth: Payer: Self-pay | Admitting: Cardiovascular Disease

## 2016-02-07 ENCOUNTER — Encounter (HOSPITAL_COMMUNITY): Payer: Self-pay | Admitting: Cardiology

## 2016-02-07 DIAGNOSIS — Z8249 Family history of ischemic heart disease and other diseases of the circulatory system: Secondary | ICD-10-CM | POA: Diagnosis not present

## 2016-02-07 DIAGNOSIS — Z955 Presence of coronary angioplasty implant and graft: Secondary | ICD-10-CM | POA: Diagnosis not present

## 2016-02-07 DIAGNOSIS — I10 Essential (primary) hypertension: Secondary | ICD-10-CM

## 2016-02-07 DIAGNOSIS — I208 Other forms of angina pectoris: Secondary | ICD-10-CM | POA: Diagnosis not present

## 2016-02-07 DIAGNOSIS — E785 Hyperlipidemia, unspecified: Secondary | ICD-10-CM | POA: Diagnosis not present

## 2016-02-07 DIAGNOSIS — I25119 Atherosclerotic heart disease of native coronary artery with unspecified angina pectoris: Secondary | ICD-10-CM

## 2016-02-07 DIAGNOSIS — I2584 Coronary atherosclerosis due to calcified coronary lesion: Secondary | ICD-10-CM | POA: Diagnosis not present

## 2016-02-07 DIAGNOSIS — Z96651 Presence of right artificial knee joint: Secondary | ICD-10-CM | POA: Diagnosis not present

## 2016-02-07 DIAGNOSIS — N400 Enlarged prostate without lower urinary tract symptoms: Secondary | ICD-10-CM | POA: Diagnosis not present

## 2016-02-07 DIAGNOSIS — K219 Gastro-esophageal reflux disease without esophagitis: Secondary | ICD-10-CM | POA: Diagnosis not present

## 2016-02-07 LAB — BASIC METABOLIC PANEL
Anion gap: 5 (ref 5–15)
BUN: 16 mg/dL (ref 6–20)
CHLORIDE: 109 mmol/L (ref 101–111)
CO2: 24 mmol/L (ref 22–32)
CREATININE: 1.29 mg/dL — AB (ref 0.61–1.24)
Calcium: 8.8 mg/dL — ABNORMAL LOW (ref 8.9–10.3)
GFR calc Af Amer: 57 mL/min — ABNORMAL LOW (ref >60)
GFR, EST NON AFRICAN AMERICAN: 49 mL/min — AB (ref >60)
Glucose, Bld: 83 mg/dL (ref 65–99)
Potassium: 4.1 mmol/L (ref 3.5–5.1)
SODIUM: 138 mmol/L (ref 135–145)

## 2016-02-07 LAB — CBC
HCT: 42.3 % (ref 39.0–52.0)
Hemoglobin: 13.6 g/dL (ref 13.0–17.0)
MCH: 28.2 pg (ref 26.0–34.0)
MCHC: 32.2 g/dL (ref 30.0–36.0)
MCV: 87.6 fL (ref 78.0–100.0)
PLATELETS: 105 10*3/uL — AB (ref 150–400)
RBC: 4.83 MIL/uL (ref 4.22–5.81)
RDW: 13.8 % (ref 11.5–15.5)
WBC: 7.2 10*3/uL (ref 4.0–10.5)

## 2016-02-07 LAB — TROPONIN I: Troponin I: 0.06 ng/mL (ref <0.03)

## 2016-02-07 MED ORDER — ASPIRIN 81 MG PO CHEW
81.0000 mg | CHEWABLE_TABLET | Freq: Every day | ORAL | Status: DC
Start: 2016-02-07 — End: 2016-03-08

## 2016-02-07 MED ORDER — CLOPIDOGREL BISULFATE 75 MG PO TABS: 75.0000 mg | ORAL_TABLET | Freq: Every day | ORAL | 11 refills | Status: AC

## 2016-02-07 MED ORDER — ATORVASTATIN CALCIUM 20 MG PO TABS: 20.0000 mg | ORAL_TABLET | Freq: Every day | ORAL | Status: DC

## 2016-02-07 MED ORDER — ATORVASTATIN CALCIUM 40 MG PO TABS: 40.0000 mg | ORAL_TABLET | Freq: Every day | ORAL | Status: DC

## 2016-02-07 MED ORDER — AMLODIPINE BESYLATE 2.5 MG PO TABS: 2.5000 mg | ORAL_TABLET | Freq: Every day | ORAL | 11 refills | Status: DC

## 2016-02-07 MED ORDER — NITROGLYCERIN 0.4 MG SL SUBL: 0.4000 mg | SUBLINGUAL_TABLET | SUBLINGUAL | 3 refills | Status: AC | PRN

## 2016-02-07 MED ORDER — AMLODIPINE BESYLATE 5 MG PO TABS
2.5000 mg | ORAL_TABLET | Freq: Every day | ORAL | Status: DC
  Administered 2016-02-07: 12:00:00 2.5 mg via ORAL
  Filled 2016-02-07: qty 1

## 2016-02-07 NOTE — Progress Notes (Signed)
CARDIAC REHAB PHASE I   PRE:  Rate/Rhythm: 102 ST  BP:  Sitting: 158/86        SaO2: 97 RA  MODE:  Ambulation: 350 ft   POST:  Rate/Rhythm: 116 ST  BP:  Sitting: 138/86         SaO2: 96 RA  Pt ambulated 350 ft on RA, handheld assist, mostly steady gait, tolerated well.  Pt c/o DOE, states it is improved, denies cp, dizziness, standing rest x2. Completed PCI/stent education.  Reviewed risk factors, PCI book, anti-platelet therapy, stent card, activity restrictions, ntg, exercise, heart healthy diet, and phase 2 cardiac rehab. Pt verbalized understanding. Pt agrees to phase 2 cardiac rehab referral (he completed the program eight years ago), will send to [facility] per pt request. Pt to recliner after walk, call bell within reach.  [phone] Joylene Grapes, RN, BSN 02/07/2016 8:52 AM

## 2016-02-07 NOTE — Progress Notes (Signed)
Patient Name: Patrick Knapp Date of Encounter: 02/07/2016  Hospital Problem List     Active Problems:   Coronary artery disease   Exertional angina (HCC)   Abnormal nuclear cardiac imaging test   HTN (hypertension)   Hyperlipemia    Subjective   Feeling well this morning. No dyspnea or chest pain.  Inpatient Medications    . aspirin  81 mg Oral Daily  . atorvastatin  20 mg Oral q1800  . cholecalciferol  1,000 Units Oral Daily  . clopidogrel  75 mg Oral Daily  . doxazosin  2 mg Oral q morning - 10a  . magnesium gluconate  500 mg Oral Daily  . metoprolol  50 mg Oral Daily  . pantoprazole  40 mg Oral QHS  . psyllium  1 packet Oral Daily  . sodium chloride flush  3 mL Intravenous Q12H    Vital Signs    Vitals:   02/06/16 2230 02/06/16 2300 02/07/16 0358 02/07/16 0730  BP: (!) 161/57 (!) 145/58 (!) 142/59 (!) 158/86  Pulse:   66 92  Resp: 20 (!) 22 (!) 21 18  Temp:   97.8 F (36.6 C) 98.1 F (36.7 C)  TempSrc:   Oral Oral  SpO2:   98% 98%  Weight:   196 lb 10.4 oz (89.2 kg)   Height:        Intake/Output Summary (Last 24 hours) at 02/07/16 0739 Last data filed at 02/07/16 0703  Gross per 24 hour  Intake          1021.09 ml  Output             2850 ml  Net         -1828.91 ml   Filed Weights   02/06/16 0847 02/07/16 0358  Weight: 199 lb (90.3 kg) 196 lb 10.4 oz (89.2 kg)    Physical Exam    General: Pleasant, NAD. Neuro: Alert and oriented X 3. Moves all extremities spontaneously. Psych: Normal affect. HEENT:  Normal  Neck: Supple without bruits or JVD. Lungs:  Resp regular and unlabored, CTA. Heart: RRR no s3, s4, or murmurs. Abdomen: Soft, non-tender, non-distended, BS + x 4.  Extremities: No clubbing, cyanosis or edema. DP/PT/Radials 2+ and equal bilaterally. Right radial cath site stable.  Labs    CBC  Recent Labs  02/07/16 0256  WBC 7.2  HGB 13.6  HCT 42.3  MCV 87.6  PLT 105*   Basic Metabolic Panel  Recent Labs   24/09/73 0256  NA 138  K 4.1  CL 109  CO2 24  GLUCOSE 83  BUN 16  CREATININE 1.29*  CALCIUM 8.8*   Liver Function Tests No results for input(s): AST, ALT, ALKPHOS, BILITOT, PROT, ALBUMIN in the last 72 hours. No results for input(s): LIPASE, AMYLASE in the last 72 hours. Cardiac Enzymes  Recent Labs  02/06/16 1425 02/07/16 0256  TROPONINI <0.03 0.06*   BNP Invalid input(s): POCBNP D-Dimer No results for input(s): DDIMER in the last 72 hours. Hemoglobin A1C No results for input(s): HGBA1C in the last 72 hours. Fasting Lipid Panel No results for input(s): CHOL, HDL, LDLCALC, TRIG, CHOLHDL, LDLDIRECT in the last 72 hours. Thyroid Function Tests No results for input(s): TSH, T4TOTAL, T3FREE, THYROIDAB in the last 72 hours.  Invalid input(s): FREET3  Telemetry    SB, 4 beat NSVT asymptomatic   ECG    SR without acute ST/T wave changes  Radiology    No results found.  Assessment &  Plan    82 yo male patient of Dr. Elease Hashimoto with PMH of CAD, HTN, HLD and BPH. He presented to the office 01/23/2016 c/o increased fatigue, and dyspnea on exertion. He felt like these symptoms were similar to when his previous stent was placed. At that time the decision was made to preform Lexiscan myoview. The results of the lexiscan showed apical ischemia, but the patient did continue to report worsening dyspnea on exertion that was significantly interfering with his daily activities. Given he had no reports of chest pain with his last PCI with stent placement, and reported these current symptoms to be very similar he was scheduled to undergo LHC.   1. CAD s/p DES to LCx: Presented yesterday for PCI and noted to have 90% stenosis to the Mid LAD that was treated with orbital atherectomy and bio freedom DES. Continued patency of previously stented LCx and widely patent RCA. --Recommendations were DAPT with ASA and Plavix for 30 days and then with plavix only per the Bio Freedom protocol. Cr is  stable post PCI at 1.29 (Baseline appears 1.25), Hgb 13.6. Did have a slightly bump in trop early this am 0.06, but denies any chest pain or dyspnea. Has been ambulatory in the room, with plans to work with cardiac rehab this morning.  -- Will arrange TOC appt.  2. HTN: Borderline controlled. Currently on metoprolol 50 daily, and cardura  daily   3. HLD: On Liptor , increase?  Signed, Laverda Page NP-C Pager [phone]

## 2016-02-07 NOTE — Progress Notes (Signed)
Lab called with a critical troponin of 0.06, pt last troponin was <0.03 at 1430 on 02/06/16.  Paged on call MD, return call immediately and updated MD.  No new orders given,  Will continue to monitor pt closely.

## 2016-02-07 NOTE — Care Management Note (Signed)
Case Management Note  Patient Details  Name: Patrick Knapp MRN: [id number] Date of Birth: 09/30/1927  Subjective/Objective:       Patient s/p stent intervention, on plavis, for discharge, no needs.              Action/Plan:   Expected Discharge Date:                  Expected Discharge Plan:  Home/Self Care  In-House Referral:     Discharge planning Services  CM Consult  Post Acute Care Choice:    Choice offered to:     DME Arranged:    DME Agency:     HH Arranged:    HH Agency:     Status of Service:  Completed, signed off  If discussed at [employer] of Stay Meetings, dates discussed:    Additional Comments:  Leone Haven, RN 02/07/2016, 11:44 AM

## 2016-02-07 NOTE — Telephone Encounter (Signed)
New Message  TOC appt with Tereso Newcomer per Lindsy on 8.11 @ 845 am.

## 2016-02-07 NOTE — Discharge Summary (Signed)
Discharge Summary    Patient ID: Patrick Knapp,  MRN: [id number], DOB/AGE: 04-03-1928 82 y.o.  Admit date: 02/06/2016 Discharge date: 02/07/2016  Primary Care Provider: Chilton Greathouse R Primary Cardiologist: Nahser  Discharge Diagnoses    Active Problems:   Coronary artery disease   Exertional angina (HCC)   Abnormal nuclear cardiac imaging test   HTN (hypertension)   Hyperlipemia   Coronary artery disease involving native coronary artery of native heart with angina pectoris (HCC)   Allergies Allergies  Allergen Reactions  . Chlorhexidine Gluconate Itching  . Iodinated Diagnostic Agents     Not really sure of the incident// patient denies allergy and has had catherterization without incident.    Diagnostic Studies/Procedures    LHC: 02/06/2016  Conclusion     Ost 1st Diag to 1st Diag lesion, 90 %stenosed.  Mid Cx lesion, 10 %stenosed.  Prox Cx lesion, 25 %stenosed.  LV end diastolic pressure is normal.  A drug eluting stent was successfully placed.  Prox LAD to Mid LAD lesion, 90 %stenosed.  Post intervention, there is a 0% residual stenosis.   1. Normal right and left heart pressures with normal cardiac output 2. Continued patency of the stented segment in the left circumflex nonobstructive circumflex stenosis 3. Severe calcified stenosis of the proximal LAD treated successfully with orbital atherectomy and stenting with a bio freedom DES 4. Widely patent RCA  DAPT with ASA and plavix x 30 days, then continue with Plavix alone per Bio-freedom protocol.    Diagnostic Diagram     Post-Intervention Diagram       _____________   History of Present Illness     82 yo male patient of Dr. Elease Hashimoto with PMH of CAD s/p DES to LCx, HTN, HLD and BPH. He presented to the office 01/23/2016 c/o increased fatigue, and dyspnea on exertion. He felt like these symptoms were similar to when his previous stent was placed. At that time the decision was made  to preform Lexiscan myoview. The results of the lexiscan showed apical ischemia, but the patient did continue to report worsening dyspnea on exertion that was significantly interfering with his daily activities. Given he had no reports of chest pain with his last PCI with stent placement, and reported these current symptoms to be very similar he was scheduled to undergo LHC.   Hospital Course     Consultants: None  Presented 02/06/2016 for PCI and noted to have 90% stenosis to the Mid LAD that was treated with orbital atherectomy and bio freedom DES. Continued patency of previously stented LCx and widely patent RCA. Recommendation for DAPT with ASA and Plavix for 30 days and then just Plavix per Bio Freedom protocol. His Cr was stable post cath, and able to ambulate with cardiac rehab with no reports of angina or dyspnea.   He was seen and assessed by Dr. Mayford Knife and determined stable for discharge home. Given his blood pressure has remained elevated, amlodipine 2.5mg  daily was added. No room for ACE/ARB given slightly elevated Cr. Also no room to increase BB given his bradycardia on telemetry at night. I have arranged for TOC appt for follow up.  _____________  Discharge Vitals Blood pressure 128/74, pulse 87, temperature 97.6 F (36.4 C), temperature source Oral, resp. rate 14, height  (1.727 m), weight 196 lb 10.4 oz (89.2 kg), SpO2 98 %.  Filed Weights   02/06/16 0847 02/07/16 0358  Weight: 199 lb (90.3 kg) 196 lb 10.4 oz (89.2 kg)  Labs & Radiologic Studies    CBC  Recent Labs  02/07/16 0256  WBC 7.2  HGB 13.6  HCT 42.3  MCV 87.6  PLT 105*   Basic Metabolic Panel  Recent Labs  02/07/16 0256  NA 138  K 4.1  CL 109  CO2 24  GLUCOSE 83  BUN 16  CREATININE 1.29*  CALCIUM 8.8*   Liver Function Tests No results for input(s): AST, ALT, ALKPHOS, BILITOT, PROT, ALBUMIN in the last 72 hours. No results for input(s): LIPASE, AMYLASE in the last 72 hours. Cardiac  Enzymes  Recent Labs  02/06/16 1425 02/07/16 0256  TROPONINI <0.03 0.06*   BNP Invalid input(s): POCBNP D-Dimer No results for input(s): DDIMER in the last 72 hours. Hemoglobin A1C No results for input(s): HGBA1C in the last 72 hours. Fasting Lipid Panel No results for input(s): CHOL, HDL, LDLCALC, TRIG, CHOLHDL, LDLDIRECT in the last 72 hours. Thyroid Function Tests No results for input(s): TSH, T4TOTAL, T3FREE, THYROIDAB in the last 72 hours.  Invalid input(s): FREET3 _____________  No results found. Disposition   Pt is being discharged home today in good condition.  Follow-up Plans & Appointments    Follow-up Information    Tereso Newcomer, PA-C Follow up on 02/17/2016.   Specialties:  Cardiology, Physician Assistant Why:  8:45 am for your hospital follow up with Dr. Sallee Provencal PA Tereso Newcomer. Contact information: [address] [place] [address] [phone]          Discharge Instructions    Amb Referral to Cardiac Rehabilitation    Complete by:  As directed   Diagnosis:  Coronary Stents   Diet - low sodium heart healthy    Complete by:  As directed   Discharge instructions    Complete by:  As directed   Radial Site Care Refer to this sheet in the next few weeks. These instructions provide you with information on caring for yourself after your procedure. Your caregiver may also give you more specific instructions. Your treatment has been planned according to current medical practices, but problems sometimes occur. Call your caregiver if you have any problems or questions after your procedure. HOME CARE INSTRUCTIONS You may shower the day after the procedure.Remove the bandage (dressing) and gently wash the site with plain soap and water.Gently pat the site dry.  Do not apply powder or lotion to the site.  Do not submerge the affected site in water for 3 to 5 days.  Inspect the site at least twice daily.  Do not flex or bend the affected arm  for 24 hours.  No lifting over 5 pounds (2.3 kg) for 5 days after your procedure.  Do not drive home if you are discharged the same day of the procedure. Have someone else drive you.  You may drive 24 hours after the procedure unless otherwise instructed by your caregiver.  What to expect: Any bruising will usually fade within 1 to 2 weeks.  Blood that collects in the tissue (hematoma) may be painful to the touch. It should usually decrease in size and tenderness within 1 to 2 weeks.  SEEK IMMEDIATE MEDICAL CARE IF: You have unusual pain at the radial site.  You have redness, warmth, swelling, or pain at the radial site.  You have drainage (other than a small amount of blood on the dressing).  You have chills.  You have a fever or persistent symptoms for more than 72 hours.  You have a fever and your symptoms  suddenly get worse.  Your arm becomes pale, cool, tingly, or numb.  You have heavy bleeding from the site. Hold pressure on the site.   Increase activity slowly    Complete by:  As directed      Discharge Medications   Current Discharge Medication List    START taking these medications   Details  amLODipine (NORVASC) 2.5 MG tablet Take 1 tablet (2.5 mg total) by mouth daily. Qty: 30 tablet, Refills: 11    aspirin 81 MG chewable tablet Chew 1 tablet (81 mg total) by mouth daily.    nitroGLYCERIN (NITROSTAT) 0.4 MG SL tablet Place 1 tablet (0.4 mg total) under the tongue every 5 (five) minutes as needed for chest pain. Qty: 25 tablet, Refills: 3      CONTINUE these medications which have CHANGED   Details  clopidogrel (PLAVIX) 75 MG tablet Take 1 tablet (75 mg total) by mouth daily. Qty: 30 tablet, Refills: 11      CONTINUE these medications which have NOT CHANGED   Details  acetaminophen (TYLENOL) 325 MG tablet Take 2 tablets (650 mg total) by mouth every 6 (six) hours as needed for mild pain (or Fever >/= 101). Qty: 40 tablet, Refills: 0    ALPRAZolam (XANAX) 0.5 MG  tablet Take 0.5 mg by mouth 3 (three) times daily as needed. AS NEEDED FOR ANXIETY    atorvastatin (LIPITOR) 20 MG tablet Take 20 mg by mouth daily.     cholecalciferol (VITAMIN D) 1000 units tablet Take 1,000 Units by mouth daily.    doxazosin (CARDURA) 4 MG tablet Take 2 mg by mouth every morning.     magnesium gluconate (MAGONATE) 500 MG tablet Take 500 mg by mouth daily.    metoprolol (LOPRESSOR) 50 MG tablet Take 50 mg by mouth daily.    pantoprazole (PROTONIX) 40 MG tablet Take 40 mg by mouth daily. DAILY AT HS    psyllium (REGULOID) 0.52 g capsule Take 0.52 g by mouth daily.    tiZANidine (ZANAFLEX) 4 MG tablet Take 4 mg by mouth daily as needed. AS NEEDED FOR MUSCLE SPASMS         Aspirin prescribed at discharge?  Yes High Intensity Statin Prescribed? (Lipitor 40-80mg  or Crestor 20-40mg ): Yes Beta Blocker Prescribed? Yes For EF <40%, was ACEI/ARB Prescribed? No: Elevated Cr ADP Receptor Inhibitor Prescribed? (i.e. Plavix etc.-Includes Medically Managed Patients): Yes For EF <40%, Aldosterone Inhibitor Prescribed? No:  Was EF assessed during THIS hospitalization? Yes Was Cardiac Rehab II ordered? (Included Medically managed Patients): Yes   Outstanding Labs/Studies   F/u BMET at office visit  Duration of Discharge Encounter   Greater than 30 minutes including physician time.  Signed, Laverda Page NP-C 02/07/2016, 11:25 AM

## 2016-02-08 NOTE — Telephone Encounter (Signed)
Patient contacted regarding discharge from [facility] on 02/07/16 Patient understands to follow up with Tereso Newcomer, PA on 02/17/16 at 8:45 am at the [facility]. Patient understands discharge instructions? Yes Patient understands medications and regimen? Yes Patient understands to bring all medications to this visit? Yes   Bruising to right wrist area.  Right hand with no tingling, numbness, or weakness.  Dressing remains intact.  No redness or drainage noted.  He will remove dressing tonight or tomorrow am, wash with soap and water, rinse, pat dry and leave open to air.  Will not soak area in water.    Aware to not lift >5 lbs.  No questions.

## 2016-02-16 ENCOUNTER — Encounter: Payer: Self-pay | Admitting: Physician Assistant

## 2016-02-16 NOTE — Progress Notes (Signed)
Cardiology Office Note:    Date:  02/17/2016   ID:  Patrick Knapp, DOB 20-May-1928, MRN [id number]  PCP:  Hoyle SauerAVVA,RAVISANKAR R, MD  Cardiologist:  Dr. Delane GingerPhil Nahser   Electrophysiologist:  n/a  Referring MD: Chilton GreathouseAvva, Ravisankar, MD   Chief Complaint  Patient presents with  . Hospitalization Follow-up    s/p PCI;  transitional care management follow-up appointment    History of Present Illness:    Patrick Knapp is a 82 y.o. male with a hx of CAD, HTN, HL. He is status post stenting to the LCx in 2008.  Last seen by Dr. Elease HashimotoNahser 01/23/16. He complained of dyspnea with exertion and exhaustion. Stress testing was arranged. Nuclear study demonstrated apical defect consistent with artifact, low risk. However, he continued to have symptoms consistent with his previous angina and LHC was arranged.  LHC dated 02/06/16 demonstrated patent stent in the LCx 90% proximal LAD stenosis and 90% D1 stenosis. He underwent orbital atherectomy and Bio freedom DES research stent to the LAD. Per protocol, the patient will remain on aspirin and Plavix 30 days, then continue Plavix alone.  He returns for posthospitalization follow-up. This is a transitional care management appointment.  Since DC, he has done well. He denies chest pain. He has never had chest pain.  His dyspnea and fatigue is improved.  He still gets fatigued at times. He denies syncope, orthopnea, PND, edema.  Denies bleeding issues.     Prior CV studies that were reviewed today include:    LHC 02/06/16 LAD proximal 90%, D1 90% LCx proximal 25%, mid stent patent with 10% ISR RCA heavily calcified LVEDP normal PCI: Orbital atherectomy and 3 x 14 mm biofreedom DES research stent 1. Normal right and left heart pressures with normal cardiac output 2. Continued patency of the stented segment in the left circumflex nonobstructive circumflex stenosis 3. Severe calcified stenosis of the proximal LAD treated successfully with orbital atherectomy  and stenting with a bio freedom DES 4. Widely patent RCA DAPT with ASA and plavix x 30 days, then continue with Plavix alone per Bio-freedom protocol.   Myoview 01/31/16 EF 62%, apical defect consistent with artifact, low risk  Echo 11/08 Normal LV function, mild aortic sclerosis, mild AI, mild MR, mild TR  Past Medical History:  Diagnosis Date  . Arthritis    "all the joints" (02/06/2016)  . Coronary artery disease    a. PCI with DES to mid LAD 7/17 b.post PTCA and stenting of his left circumflex artery in 12/08  . Enlarged prostate   . GERD (gastroesophageal reflux disease)   . Hyperlipidemia   . Hypertension   . Ischemia   . Shortness of breath   . Skin disorder    Occurs at times as rash, whelps, blister-like areas  with itching sometimes-has occurred for years , but can not find an exact dx or cause    Past Surgical History:  Procedure Laterality Date  . CARDIAC CATHETERIZATION N/A 02/06/2016   Procedure: Right/Left Heart Cath and Coronary Angiography;  Surgeon: Tonny BollmanMichael Cooper, MD;  [facility];  Service: Cardiovascular;  Laterality: N/A;  . CATARACT EXTRACTION, BILATERAL Bilateral 2016  . CORONARY ANGIOPLASTY WITH STENT PLACEMENT  06/2007   Est. EF of 55-60%PTCA and stenting of his left circumflex artery in 12/08  . EYE SURGERY    . INGUINAL HERNIA REPAIR Left   . JOINT REPLACEMENT    . KNEE ARTHROSCOPY  1990s  . ORBITAL FRACTURE SURGERY Left 1957  .  TONSILLECTOMY    . TOTAL KNEE ARTHROPLASTY Right 05/31/2014   Procedure: RIGHT TOTAL KNEE ARTHROPLASTY;  Surgeon: Loanne Drilling, MD;  Location: [facility];  Service: Orthopedics;  Laterality: Right;    Current Medications: Current Meds  Medication Sig  . acetaminophen (TYLENOL) 325 MG tablet Take 2 tablets (650 mg total) by mouth every 6 (six) hours as needed for mild pain (or Fever >/= 101).  Marland Kitchen ALPRAZolam (XANAX) 0.5 MG tablet Take 0.5 mg by mouth 3 (three) times daily as needed. AS NEEDED FOR ANXIETY  .  amLODipine (NORVASC) 2.5 MG tablet Take 1 tablet (2.5 mg total) by mouth daily.  Marland Kitchen aspirin 81 MG chewable tablet Chew 1 tablet (81 mg total) by mouth daily.  Marland Kitchen atorvastatin (LIPITOR) 20 MG tablet Take 20 mg by mouth daily.   . cholecalciferol (VITAMIN D) 1000 units tablet Take 1,000 Units by mouth daily.  . clopidogrel (PLAVIX) 75 MG tablet Take 1 tablet (75 mg total) by mouth daily.  Marland Kitchen doxazosin (CARDURA) 4 MG tablet Take 2 mg by mouth every morning.   . magnesium gluconate (MAGONATE) 500 MG tablet Take 500 mg by mouth daily.  . metoprolol (LOPRESSOR) 50 MG tablet Take 0.5 tablets (25 mg total) by mouth 2 (two) times daily.  . nitroGLYCERIN (NITROSTAT) 0.4 MG SL tablet Place 1 tablet (0.4 mg total) under the tongue every 5 (five) minutes as needed for chest pain.  . pantoprazole (PROTONIX) 40 MG tablet Take 40 mg by mouth daily. DAILY AT HS  . psyllium (REGULOID) 0.52 g capsule Take 0.52 g by mouth daily.  Marland Kitchen tiZANidine (ZANAFLEX) 4 MG tablet Take 4 mg by mouth daily as needed. AS NEEDED FOR MUSCLE SPASMS  . [DISCONTINUED] metoprolol (LOPRESSOR) 50 MG tablet Take 50 mg by mouth daily.       Allergies:   Chlorhexidine gluconate and Iodinated diagnostic agents   Social History   Social History  . Marital status: Married    Spouse name: N/A  . Number of children: N/A  . Years of education: N/A   Social History Main Topics  . Smoking status: Never Smoker  . Smokeless tobacco: Never Used  . Alcohol use Yes     Comment: 02/06/2016 "might have a glass of beer or wine once/month"  . Drug use: No  . Sexual activity: Not Asked   Other Topics Concern  . None   Social History Narrative  . None     Family History:  The patient's family history includes Coronary artery disease in his brother; Heart disease in his brother, father, and mother; Heart failure in his mother.   ROS:   Please see the history of present illness.    Review of Systems  Skin: Positive for color change.   All  other systems reviewed and are negative.   EKGs/Labs/Other Test Reviewed:    EKG:  EKG is  ordered today.  The ekg ordered today demonstrates Sinus bradycardia, HR 51, LAD, incomplete RBBB, QTc 368 ms, no significant change  Recent Labs: 02/07/2016: BUN 16; Creatinine, Ser 1.29; Hemoglobin 13.6; Platelets 105; Potassium 4.1; Sodium 138   Recent Lipid Panel    Component Value Date/Time   CHOL 115 03/17/2014 0923   TRIG 142.0 03/17/2014 0923   HDL 37.20 (L) 03/17/2014 0923   CHOLHDL 3 03/17/2014 0923   VLDL 28.4 03/17/2014 0923   LDLCALC 49 03/17/2014 0923     Physical Exam:    VS:  BP 124/60   Pulse (!) 50  Ht  (1.727 m)   Wt 195 lb 6.4 oz (88.6 kg)   BMI 29.71 kg/m     Wt Readings from Last 3 Encounters:  02/17/16 195 lb 6.4 oz (88.6 kg)  02/07/16 196 lb 10.4 oz (89.2 kg)  01/23/16 199 lb 12.8 oz (90.6 kg)     Physical Exam  Constitutional: He is oriented to person, place, and time. He appears well-developed and well-nourished. No distress.  HENT:  Head: Normocephalic and atraumatic.  Eyes: No scleral icterus.  Neck: Normal range of motion. No JVD present.  Cardiovascular: Normal rate, regular rhythm, S1 normal and S2 normal.  Exam reveals no gallop and no friction rub.   No murmur heard. Pulmonary/Chest: Effort normal and breath sounds normal. He has no wheezes. He has no rhonchi. He has no rales.  Abdominal: Soft. There is no tenderness.  Musculoskeletal: He exhibits no edema.  R wrist without hematoma; there is extensive ecchymosis noted without significant tenderness  Neurological: He is alert and oriented to person, place, and time.  Skin: Skin is warm and dry.  Psychiatric: He has a normal mood and affect.    ASSESSMENT:    1. Coronary artery disease involving native coronary artery of native heart with angina pectoris (HCC)   2. Essential hypertension   3. Hyperlipemia    PLAN:    In order of problems listed above:  1. CAD - Hx of stent to  LCx in 2008.  Now s/p Biofreedom DES to LAD.  He will be on ASA + Plavix x 30 days per protocol and then Plavix alone.  He is doing well. He still has some residual dyspnea and fatigue, but this is improving.  He has a small Dx with 90% stenosis that was not treated.  He may be experiencing residual symptoms from this.  He will let us know if this continues.  If so, will try Imdur 15 mg QD.   -  DC ASA 30 days post PCI per protocol  -  FU with research as planned  -  Continue Plavix  -  Continue beta blocker (change Metoprolol to 25 mg bid instead of 50 mg QD), Amlodipine  -  Continue statin.  2. HTN - BP controlled.  Continue current Rx.   3. HL - Continue statin.  Followed by PCP.    Medication Adjustments/Labs and Tests Ordered: Current medicines are reviewed at length with the patient today.  Concerns regarding medicines are outlined above.  Medication changes, Labs and Tests ordered today are outlined in the Patient Instructions noted below. Patient Instructions  Medication Instructions:  CHANGE Metoprolol Tartrate to 50 mg take 1/2 tablet (25 mg) Twice daily   FOLLOW THE RECOMMENDATIONS from Research regarding Aspirin - STOP Aspirin after March 08, 2016. DO NOT STOP Plavix (Clopidogrel).  If your shortness of breath and fatigue do not improve, let us know. We can try a medication called Isosorbide (Imdur).  Labwork: BMET TODAY Testing/Procedures: None   Follow-Up: Dr. Delane Ginger on 06/04/16 @ 7:30 AM   Any Other Special Instructions Will Be Listed Below (If Applicable). Call if you do not hear from cardiac rehabilitation.   If you need a refill on your cardiac medications before your next appointment, please call your pharmacy.   Signed, Tereso Newcomer, PA-C  02/17/2016 9:24 AM    [facility] [address] [place], [place], [place]  [address] Phone: [phone]; Fax: [phone]

## 2016-02-17 ENCOUNTER — Encounter (INDEPENDENT_AMBULATORY_CARE_PROVIDER_SITE_OTHER): Payer: Self-pay

## 2016-02-17 ENCOUNTER — Encounter: Payer: Self-pay | Admitting: Physician Assistant

## 2016-02-17 ENCOUNTER — Other Ambulatory Visit: Payer: Self-pay | Admitting: *Deleted

## 2016-02-17 ENCOUNTER — Ambulatory Visit (INDEPENDENT_AMBULATORY_CARE_PROVIDER_SITE_OTHER): Payer: Commercial Managed Care - HMO | Admitting: Physician Assistant

## 2016-02-17 VITALS — BP 124/60 | HR 50 | Ht 68.0 in | Wt 195.4 lb

## 2016-02-17 DIAGNOSIS — E785 Hyperlipidemia, unspecified: Secondary | ICD-10-CM

## 2016-02-17 DIAGNOSIS — I25119 Atherosclerotic heart disease of native coronary artery with unspecified angina pectoris: Secondary | ICD-10-CM | POA: Diagnosis not present

## 2016-02-17 DIAGNOSIS — I10 Essential (primary) hypertension: Secondary | ICD-10-CM | POA: Diagnosis not present

## 2016-02-17 LAB — BASIC METABOLIC PANEL
BUN: 26 mg/dL — AB (ref 7–25)
CHLORIDE: 108 mmol/L (ref 98–110)
CO2: 26 mmol/L (ref 20–31)
CREATININE: 1.49 mg/dL — AB (ref 0.70–1.11)
Calcium: 9.4 mg/dL (ref 8.6–10.3)
Glucose, Bld: 92 mg/dL (ref 65–99)
POTASSIUM: 4.3 mmol/L (ref 3.5–5.3)
Sodium: 141 mmol/L (ref 135–146)

## 2016-02-17 MED ORDER — AMLODIPINE BESYLATE 2.5 MG PO TABS: 2.5000 mg | ORAL_TABLET | Freq: Every day | ORAL | 3 refills | Status: DC

## 2016-02-17 MED ORDER — METOPROLOL TARTRATE 50 MG PO TABS: 25.0000 mg | ORAL_TABLET | Freq: Two times a day (BID) | ORAL | 3 refills | Status: DC

## 2016-02-17 NOTE — Patient Instructions (Addendum)
Medication Instructions:  CHANGE Metoprolol Tartrate to 50 mg take 1/2 tablet (25 mg) Twice daily   FOLLOW THE RECOMMENDATIONS from Research regarding Aspirin - STOP Aspirin after March 08, 2016. DO NOT STOP Plavix (Clopidogrel).  If your shortness of breath and fatigue do not improve, let us know. We can try a medication called Isosorbide (Imdur).  Labwork: BMET TODAY Testing/Procedures: None   Follow-Up: Dr. Delane GingerPhil Nahser on 06/04/16 @ 7:30 AM   Any Other Special Instructions Will Be Listed Below (If Applicable). Call if you do not hear from cardiac rehabilitation.   If you need a refill on your cardiac medications before your next appointment, please call your pharmacy.

## 2016-02-20 ENCOUNTER — Telehealth: Payer: Self-pay | Admitting: *Deleted

## 2016-02-20 DIAGNOSIS — I10 Essential (primary) hypertension: Secondary | ICD-10-CM

## 2016-02-20 NOTE — Telephone Encounter (Signed)
Follow up  Pt returning nurses call to go over lab results.  Please follow up. Thanks!

## 2016-02-20 NOTE — Telephone Encounter (Signed)
Pt has been notified of lab results by phone with verbal understanding. Pt asked if we could give him something to help sleep. I advised pt to call PCP for this, pt agreeable to plan of care. BMET 9/18.

## 2016-02-20 NOTE — Telephone Encounter (Signed)
Lmtcb to go over lab results 

## 2016-02-23 ENCOUNTER — Other Ambulatory Visit: Payer: Self-pay | Admitting: *Deleted

## 2016-02-23 DIAGNOSIS — I10 Essential (primary) hypertension: Secondary | ICD-10-CM

## 2016-02-23 MED ORDER — METOPROLOL TARTRATE 50 MG PO TABS: 25.0000 mg | ORAL_TABLET | Freq: Two times a day (BID) | ORAL | 3 refills | Status: DC

## 2016-02-29 ENCOUNTER — Encounter: Payer: Self-pay | Admitting: *Deleted

## 2016-02-29 DIAGNOSIS — Z006 Encounter for examination for normal comparison and control in clinical research program: Secondary | ICD-10-CM

## 2016-02-29 NOTE — Progress Notes (Signed)
LEADERS FREE II Research study month 1 follow up visit completed. EKG obtained per protocol patient states he has been angina free since discharge. I questioned him about DAPT and he tells me that he has "missed about 6 days or more of the ASA 81 mg." I informed him of the importance of DAPT. He was scheduled to stop ASA on 03/08/16 (30 days post BIO-FREEDOM Stent implant). I encouraged him to take ASA immediately and informed him I will talk with cardiologist and would call him if any changes were needed to the current plan. Patient verbalizes understanding.

## 2016-03-01 ENCOUNTER — Telehealth: Payer: Self-pay | Admitting: *Deleted

## 2016-03-01 NOTE — Telephone Encounter (Signed)
Called patient to inform him that I spoke with Dr. Excell Seltzerooper about missed ASA and wants to continue as planned. Patient now states after going home and counting ASA that he only took ASA for 5 days and missed 16 doses. He currently is taking ASA and will continue until 03/08/16. Encouraged patient to call if he had any questions.

## 2016-03-09 ENCOUNTER — Other Ambulatory Visit: Payer: Self-pay | Admitting: *Deleted

## 2016-03-26 ENCOUNTER — Other Ambulatory Visit: Payer: Commercial Managed Care - HMO | Admitting: *Deleted

## 2016-03-26 DIAGNOSIS — I10 Essential (primary) hypertension: Secondary | ICD-10-CM

## 2016-03-26 LAB — BASIC METABOLIC PANEL
BUN: 22 mg/dL (ref 7–25)
CALCIUM: 9.1 mg/dL (ref 8.6–10.3)
CO2: 26 mmol/L (ref 20–31)
CREATININE: 1.21 mg/dL — AB (ref 0.70–1.11)
Chloride: 106 mmol/L (ref 98–110)
Glucose, Bld: 108 mg/dL — ABNORMAL HIGH (ref 65–99)
Potassium: 4.2 mmol/L (ref 3.5–5.3)
Sodium: 141 mmol/L (ref 135–146)

## 2016-03-27 ENCOUNTER — Telehealth: Payer: Self-pay | Admitting: *Deleted

## 2016-03-27 NOTE — Telephone Encounter (Signed)
Pt has been notified of lab results by phone with verbal understanding. 

## 2016-04-02 ENCOUNTER — Telehealth: Payer: Self-pay | Admitting: *Deleted

## 2016-04-02 NOTE — Telephone Encounter (Signed)
Left message for patient to call research office for 2 month telephone Follow up for the LEADERS FREE II Stent study.

## 2016-04-11 ENCOUNTER — Encounter: Payer: Self-pay | Admitting: *Deleted

## 2016-04-11 DIAGNOSIS — Z006 Encounter for examination for normal comparison and control in clinical research program: Secondary | ICD-10-CM

## 2016-04-11 NOTE — Progress Notes (Signed)
LEADERS FREE II Research study month 2 telephone follow up completed. Patient denies any angina or other adverse events. No changes in his medication and he has been exercising . Questions encouraged and answered.

## 2016-04-16 DIAGNOSIS — K219 Gastro-esophageal reflux disease without esophagitis: Secondary | ICD-10-CM | POA: Diagnosis not present

## 2016-04-16 DIAGNOSIS — E669 Obesity, unspecified: Secondary | ICD-10-CM | POA: Diagnosis not present

## 2016-04-16 DIAGNOSIS — R0609 Other forms of dyspnea: Secondary | ICD-10-CM | POA: Diagnosis not present

## 2016-04-16 DIAGNOSIS — G4700 Insomnia, unspecified: Secondary | ICD-10-CM | POA: Diagnosis not present

## 2016-04-16 DIAGNOSIS — E784 Other hyperlipidemia: Secondary | ICD-10-CM | POA: Diagnosis not present

## 2016-04-16 DIAGNOSIS — I25119 Atherosclerotic heart disease of native coronary artery with unspecified angina pectoris: Secondary | ICD-10-CM | POA: Diagnosis not present

## 2016-06-04 ENCOUNTER — Encounter (INDEPENDENT_AMBULATORY_CARE_PROVIDER_SITE_OTHER): Payer: Self-pay

## 2016-06-04 ENCOUNTER — Ambulatory Visit (INDEPENDENT_AMBULATORY_CARE_PROVIDER_SITE_OTHER): Payer: Commercial Managed Care - HMO | Admitting: Cardiovascular Disease

## 2016-06-04 ENCOUNTER — Encounter: Payer: Self-pay | Admitting: Cardiovascular Disease

## 2016-06-04 VITALS — BP 140/70 | HR 88 | Ht 68.0 in | Wt 199.6 lb

## 2016-06-04 DIAGNOSIS — E782 Mixed hyperlipidemia: Secondary | ICD-10-CM | POA: Diagnosis not present

## 2016-06-04 DIAGNOSIS — I10 Essential (primary) hypertension: Secondary | ICD-10-CM

## 2016-06-04 DIAGNOSIS — I25119 Atherosclerotic heart disease of native coronary artery with unspecified angina pectoris: Secondary | ICD-10-CM | POA: Diagnosis not present

## 2016-06-04 MED ORDER — ISOSORBIDE MONONITRATE ER 30 MG PO TB24: 30.0000 mg | ORAL_TABLET | Freq: Every day | ORAL | 11 refills | Status: DC

## 2016-06-04 NOTE — Patient Instructions (Addendum)
Medication Instructions:  START Isosorbide Mononitrate (Imdur) 30 mg once daily   Labwork: None Ordered   Testing/Procedures: None Ordered   Follow-Up: Your physician wants you to follow-up in: 6 months with Dr. Elease HashimotoNahser.  You will receive a reminder letter in the mail two months in advance. If you don't receive a letter, please call our office to schedule the follow-up appointment.   If you need a refill on your cardiac medications before your next appointment, please call your pharmacy.   Thank you for choosing [facility]! Eligha BridegroomMichelle Malva Diesing, RN [phone]

## 2016-06-04 NOTE — Progress Notes (Signed)
Patrick Knapp Date of Birth  1928-05-31 [facility] [address]    [address] [place], [place]  [address] [phone]  Fax  [phone]   Problem list: 1. Coronary artery disease 2. Hypertension 3. Hyperlipidemia 4. BPH   Patrick Knapp is an 82 yo with a hx of CAD, HTN, hyperlipidemia, and BPH.  He is not had any episodes of chest pain or shortness breath. He's exploring some alternate therapies for his BPH including cryotherapy.  He works at [employer] for 4 hours every day. He does not get any other regular exercise.  Sept. 9, 2015:  Patrick Knapp has had some DOE walking [place] the airport [place] .   No CP -   Similar to how he felt prior to stenting  He has a right knee injury - needs knee replacement on Nov. 23. ( [employer])  Has not been able to exercise.    January 23, 2016:  Patrick Knapp is seen today after a 2 year absence.   Seen with his daughter, Patrick Knapp  He complains of lots of fatigue. His last Myoview study was 03/25/2016. He had no evidence of ischemia at that time and had normal left ventricle systolic function.  Has severe exhaustion with any exertion .   Has lots of DOE   thinks that he feels similar to when he had his stent placed . He has never had CP - even prior to stenting  His DOE seems to be worsening   DOE even walking to his mail box   Jun 04, 2016:  Patrick Knapp had PCI ( Bio Freedom)   of his LAD in July . Patrick Knapp much better  Still having DOE and fatigue walking up hills  Had talked to [facility] Healthcare[facility] Knapp about starting Imdur if needed.    Current Outpatient Prescriptions on File Prior to Visit  Medication Sig Dispense Refill  . acetaminophen (TYLENOL) 325 MG tablet Take 2 tablets (650 mg total) by mouth every 6 (six) hours as needed for mild pain (or Fever >/= 101). 40 tablet 0  . ALPRAZolam (XANAX) 0.5 MG tablet Take 0.5 mg by mouth 3 (three) times daily as needed. AS NEEDED FOR ANXIETY    . amLODipine (NORVASC) 2.5 MG tablet Take 1 tablet (2.5 mg total) by  mouth daily. 90 tablet 3  . atorvastatin (LIPITOR) 20 MG tablet Take 20 mg by mouth daily.     . cholecalciferol (VITAMIN D) 1000 units tablet Take 1,000 Units by mouth daily.    . clopidogrel (PLAVIX) 75 MG tablet Take 1 tablet (75 mg total) by mouth daily. 30 tablet 11  . doxazosin (CARDURA) 4 MG tablet Take 2 mg by mouth every morning.     . magnesium gluconate (MAGONATE) 500 MG tablet Take 500 mg by mouth daily.    . nitroGLYCERIN (NITROSTAT) 0.4 MG SL tablet Place 1 tablet (0.4 mg total) under the tongue every 5 (five) minutes as needed for chest pain. 25 tablet 3  . pantoprazole (PROTONIX) 40 MG tablet Take 40 mg by mouth at bedtime. DAILY AT HS    . psyllium (REGULOID) 0.52 g capsule Take 0.52 g by mouth daily.     No current facility-administered medications on file prior to visit.     Allergies  Allergen Reactions  . Chlorhexidine Gluconate Itching  . Iodinated Diagnostic Agents     Not really sure of the incident// patient denies allergy and has had catherterization without incident.    Past Medical History:  Diagnosis Date  . Arthritis    "all the joints" (02/06/2016)  . Coronary artery disease    a. PCI with DES to mid LAD 7/17 b.post PTCA and stenting of his left circumflex artery in 12/08  . Enlarged prostate   . GERD (gastroesophageal reflux disease)   . Hyperlipidemia   . Hypertension   . Ischemia   . Shortness of breath   . Skin disorder    Occurs at times as rash, whelps, blister-like areas  with itching sometimes-has occurred for years , but can not find an exact dx or cause    Past Surgical History:  Procedure Laterality Date  . CARDIAC CATHETERIZATION N/A 02/06/2016   Procedure: Right/Left Heart Cath and Coronary Angiography;  Surgeon: Tonny BollmanMichael Cooper, MD;  [facility];  Service: Cardiovascular;  Laterality: N/A;  . CATARACT EXTRACTION, BILATERAL Bilateral 2016  . CORONARY ANGIOPLASTY WITH STENT PLACEMENT  06/2007   Est. EF of 55-60%PTCA  and stenting of his left circumflex artery in 12/08  . EYE SURGERY    . INGUINAL HERNIA REPAIR Left   . JOINT REPLACEMENT    . KNEE ARTHROSCOPY  1990s  . ORBITAL FRACTURE SURGERY Left 1957  . TONSILLECTOMY    . TOTAL KNEE ARTHROPLASTY Right 05/31/2014   Procedure: RIGHT TOTAL KNEE ARTHROPLASTY;  Surgeon: Loanne DrillingFrank Aluisio V, MD;  Location: [facility];  Service: Orthopedics;  Laterality: Right;    History  Smoking Status  . Never Smoker  Smokeless Tobacco  . Never Used    History  Alcohol Use  . Yes    Comment: 02/06/2016 "might have a glass of beer or wine once/month"    Family History  Problem Relation Age of Onset  . Heart failure Mother     possible  . Heart disease Mother   . Heart disease Father   . Coronary artery disease Brother   . Heart disease Brother     Reviw of Systems:  Reviewed in the HPI.  All other systems are negative.  Physical Exam: BP 140/70   Pulse 88   Ht 5\' 8"  (1.727 m)   Wt 199 lb 9.6 oz (90.5 kg)   BMI 30.35 kg/m  The patient is alert and oriented x 3.  The mood and affect are normal.   Skin: warm and dry.  Color is normal.    HEENT:   Normocephalic/atraumatic. He is normal carotids. There is no JVD.  Lungs: Lungs are clear to auscultation.   Heart: Regular rate S1-S2.    Abdomen: Normal bowel sounds. There is no hepatosplenomegaly  Extremities:  No clubbing cyanosis or edema.  Neuro:  Nonfocal. Exam is normal.    ECG: January 23, 2016:   Sinus brady at 2654.   Inc. RBBB  Assessment / Plan:    1. Coronary artery disease: Is s/p PCI to prox / mid LAD  Still having some mild shortness of breath with exertion. We will add isosorbide 30 mg a day. Will see him in 6 months .   2. Hypertension - BP is well controlled.  Will work on weight loss   3. Hyperlipidemia -  Lipids are well controlled .   4. BPH    Patrick MissPhilip Othel Dicostanzo, MD   06/04/2016 7:48 AM    [facility] [address],  [address] [place], [place]   [address] Pager [phone] Phone: [phone]; Fax: [phone]

## 2016-07-19 DIAGNOSIS — R05 Cough: Secondary | ICD-10-CM | POA: Diagnosis not present

## 2016-07-19 DIAGNOSIS — R0609 Other forms of dyspnea: Secondary | ICD-10-CM | POA: Diagnosis not present

## 2016-07-19 DIAGNOSIS — J111 Influenza due to unidentified influenza virus with other respiratory manifestations: Secondary | ICD-10-CM | POA: Diagnosis not present

## 2016-07-19 DIAGNOSIS — Z6831 Body mass index (BMI) 31.0-31.9, adult: Secondary | ICD-10-CM | POA: Diagnosis not present

## 2016-08-14 ENCOUNTER — Other Ambulatory Visit: Payer: Self-pay | Admitting: *Deleted

## 2016-08-14 ENCOUNTER — Encounter: Payer: Self-pay | Admitting: *Deleted

## 2016-08-14 DIAGNOSIS — Z006 Encounter for examination for normal comparison and control in clinical research program: Secondary | ICD-10-CM

## 2016-08-14 MED ORDER — ISOSORBIDE MONONITRATE ER 30 MG PO TB24: 30.0000 mg | ORAL_TABLET | Freq: Every day | ORAL | 2 refills | Status: DC

## 2016-08-14 NOTE — Progress Notes (Signed)
LEADERS FREE II month 6 follow up visit completed. Patient increased Imdur to 30 mg daily 06/04/16 for shortness of breath, that he states is much better. He denies angina and any other changes in medication. EKG obtained. Next required research follow up is due no later than 25/AUG/2018. Questions encouraged and answered.

## 2016-11-27 ENCOUNTER — Ambulatory Visit (INDEPENDENT_AMBULATORY_CARE_PROVIDER_SITE_OTHER): Payer: Medicare HMO | Admitting: Cardiovascular Disease

## 2016-11-27 ENCOUNTER — Encounter: Payer: Self-pay | Admitting: Cardiovascular Disease

## 2016-11-27 VITALS — BP 100/60 | HR 63 | Ht 67.0 in | Wt 196.0 lb

## 2016-11-27 DIAGNOSIS — I10 Essential (primary) hypertension: Secondary | ICD-10-CM

## 2016-11-27 DIAGNOSIS — E785 Hyperlipidemia, unspecified: Secondary | ICD-10-CM

## 2016-11-27 NOTE — Patient Instructions (Signed)
Medication Instructions:  Your physician recommends that you continue on your current medications as directed. Please refer to the Current Medication list given to you today.   Labwork: TODAY: BMET, LFTs, Lipids  Testing/Procedures: None  Follow-Up: Your physician wants you to follow-up in: 6 months with Dr. Elease HashimotoNahser. You will receive a reminder letter in the mail two months in advance. If you don't receive a letter, please call our office to schedule the follow-up appointment.   Any Other Special Instructions Will Be Listed Below (If Applicable).     If you need a refill on your cardiac medications before your next appointment, please call your pharmacy.

## 2016-11-27 NOTE — Progress Notes (Signed)
Patrick Knapp Date of Birth  June 03, 1928 [facility] [address]    [address] [place], [place]  [address] [phone]  Fax  [phone]   Problem list: 1. Coronary artery disease 2. Hypertension 3. Hyperlipidemia 4. BPH   Patrick Knapp is an 83 yo with a hx of CAD, HTN, hyperlipidemia, and BPH.  He is not had any episodes of chest pain or shortness breath. He's exploring some alternate therapies for his BPH including cryotherapy.  He works at [employer] for 4 hours every day. He does not get any other regular exercise.  Sept. 9, 2015:  Patrick Knapp has had some DOE walking [place] the airport [place] .   No CP -   Similar to how he felt prior to stenting  He has a right knee injury - needs knee replacement on Nov. 23. ( [employer])  Has not been able to exercise.    January 23, 2016:  Patrick Knapp is seen today after a 2 year absence.   Seen with his daughter, [name]  He complains of lots of fatigue. His last Myoview study was 03/25/2016. He had no evidence of ischemia at that time and had normal left ventricle systolic function.  Has severe exhaustion with any exertion .   Has lots of DOE   thinks that he feels similar to when he had his stent placed . He has never had CP - even prior to stenting  His DOE seems to be worsening   DOE even walking to his mail box   Jun 04, 2016:  Patrick Knapp had PCI ( [employer])   of his LAD in July . Doing much better  Still having DOE and fatigue walking up hills  [facility] starting Imdur if needed.   Nov 27, 2016:  Doing well.  Has some exertional leg fatigue.  No CP or dyspnea.     Current Outpatient Prescriptions on File Prior to Visit  Medication Sig Dispense Refill  . acetaminophen (TYLENOL) 325 MG tablet Take 2 tablets (650 mg total) by mouth every 6 (six) hours as needed for mild pain (or Fever >/= 101). 40 tablet 0  . ALPRAZolam (XANAX) 0.5 MG tablet Take 0.5 mg by mouth 3 (three) times daily as needed. AS  NEEDED FOR ANXIETY    . amLODipine (NORVASC) 2.5 MG tablet Take 1 tablet (2.5 mg total) by mouth daily. 90 tablet 3  . atorvastatin (LIPITOR) 20 MG tablet Take 20 mg by mouth daily.     . cholecalciferol (VITAMIN D) 1000 units tablet Take 1,000 Units by mouth daily.    . clopidogrel (PLAVIX) 75 MG tablet Take 1 tablet (75 mg total) by mouth daily. 30 tablet 11  . doxazosin (CARDURA) 4 MG tablet Take 2 mg by mouth every morning.     . isosorbide mononitrate (IMDUR) 30 MG 24 hr tablet Take 1 tablet (30 mg total) by mouth daily. 90 tablet 2  . magnesium gluconate (MAGONATE) 500 MG tablet Take 500 mg by mouth daily.    . metoprolol (LOPRESSOR) 50 MG tablet Take 25 mg by mouth 2 (two) times daily. Take 1/2 tablet by mouth twice daily    . nitroGLYCERIN (NITROSTAT) 0.4 MG SL tablet Place 1 tablet (0.4 mg total) under the tongue every 5 (five) minutes as needed for chest pain. 25 tablet 3  . pantoprazole (PROTONIX) 40 MG tablet Take 40 mg by mouth at bedtime. DAILY AT HS    . psyllium (  REGULOID) 0.52 g capsule Take 0.52 g by mouth daily.     No current facility-administered medications on file prior to visit.     Allergies  Allergen Reactions  . Chlorhexidine Gluconate Itching  . Iodinated Diagnostic Agents     Not really sure of the incident// patient denies allergy and has had catherterization without incident.    Past Medical History:  Diagnosis Date  . Arthritis    "all the joints" (02/06/2016)  . Coronary artery disease    a. PCI with DES to mid LAD 7/17 b.post PTCA and stenting of his left circumflex artery in 12/08  . Enlarged prostate   . GERD (gastroesophageal reflux disease)   . Hyperlipidemia   . Hypertension   . Ischemia   . Shortness of breath   . Skin disorder    Occurs at times as rash, whelps, blister-like areas  with itching sometimes-has occurred for years , but can not find an exact dx or cause    Past Surgical History:  Procedure Laterality Date  . CARDIAC  CATHETERIZATION N/A 02/06/2016   Procedure: Right/Left Heart Cath and Coronary Angiography;  Surgeon: Tonny Bollman, MD;  [facility] INVASIVE CV LAB;  Service: Cardiovascular;  Laterality: N/A;  . CATARACT EXTRACTION, BILATERAL Bilateral 2016  . CORONARY ANGIOPLASTY WITH STENT PLACEMENT  06/2007   Est. EF of 55-60%PTCA and stenting of his left circumflex artery in 12/08  . EYE SURGERY    . INGUINAL HERNIA REPAIR Left   . JOINT REPLACEMENT    . KNEE ARTHROSCOPY  1990s  . ORBITAL FRACTURE SURGERY Left 1957  . TONSILLECTOMY    . TOTAL KNEE ARTHROPLASTY Right 05/31/2014   Procedure: RIGHT TOTAL KNEE ARTHROPLASTY;  Surgeon: Loanne Drilling, MD;  Location: [facility];  Service: Orthopedics;  Laterality: Right;    History  Smoking Status  . Never Smoker  Smokeless Tobacco  . Never Used    History  Alcohol Use  . Yes    Comment: 02/06/2016 "might have a glass of beer or wine once/month"    Family History  Problem Relation Age of Onset  . Heart failure Mother        possible  . Heart disease Mother   . Heart disease Father   . Coronary artery disease Brother   . Heart disease Brother     Reviw of Systems:  Reviewed in the HPI.  All other systems are negative.  Physical Exam: BP 100/60   Pulse 63   Ht 5\' 7"  (1.702 m)   Wt 196 lb (88.9 kg)   SpO2 96%   BMI 30.70 kg/m  The patient is alert and oriented x 3.  The mood and affect are normal.   Skin: warm and dry.  Color is normal.    HEENT:   Normocephalic/atraumatic. He is normal carotids. There is no JVD.  Lungs: Lungs are clear to auscultation.   Heart: Regular rate S1-S2.    Abdomen: Normal bowel sounds. There is no hepatosplenomegaly  Extremities:  No clubbing cyanosis or edema.  Neuro:  Nonfocal. Exam is normal.    ECG: Nov 27, 2016:   Sinus brady at 87, LAD   Assessment / Plan:    1. Coronary artery disease: Is s/p PCI to prox / mid LAD  Doing well.   Tolerating the Imdur well .   2. Hypertension - BP  is well controlled.  Will work on weight loss   3. Hyperlipidemia -  Lipids are well controlled .  Check labs today   4. BPH    Kristeen Miss, MD   11/27/2016 8:14 AM    [facility] [address] [place],  [address] [place], [place]  [address] Pager [phone] Phone: [phone]; Fax: [phone]

## 2016-12-18 DIAGNOSIS — E784 Other hyperlipidemia: Secondary | ICD-10-CM | POA: Diagnosis not present

## 2016-12-18 DIAGNOSIS — E291 Testicular hypofunction: Secondary | ICD-10-CM | POA: Diagnosis not present

## 2016-12-18 DIAGNOSIS — Z125 Encounter for screening for malignant neoplasm of prostate: Secondary | ICD-10-CM | POA: Diagnosis not present

## 2016-12-18 DIAGNOSIS — Z0000 Encounter for general adult medical examination without abnormal findings: Secondary | ICD-10-CM | POA: Diagnosis not present

## 2016-12-18 DIAGNOSIS — M859 Disorder of bone density and structure, unspecified: Secondary | ICD-10-CM | POA: Diagnosis not present

## 2016-12-25 DIAGNOSIS — E784 Other hyperlipidemia: Secondary | ICD-10-CM | POA: Diagnosis not present

## 2016-12-25 DIAGNOSIS — Z0000 Encounter for general adult medical examination without abnormal findings: Secondary | ICD-10-CM | POA: Diagnosis not present

## 2016-12-25 DIAGNOSIS — G4709 Other insomnia: Secondary | ICD-10-CM | POA: Diagnosis not present

## 2016-12-25 DIAGNOSIS — D692 Other nonthrombocytopenic purpura: Secondary | ICD-10-CM | POA: Diagnosis not present

## 2016-12-25 DIAGNOSIS — K635 Polyp of colon: Secondary | ICD-10-CM | POA: Diagnosis not present

## 2016-12-25 DIAGNOSIS — Z96651 Presence of right artificial knee joint: Secondary | ICD-10-CM | POA: Diagnosis not present

## 2016-12-25 DIAGNOSIS — E668 Other obesity: Secondary | ICD-10-CM | POA: Diagnosis not present

## 2016-12-25 DIAGNOSIS — I25119 Atherosclerotic heart disease of native coronary artery with unspecified angina pectoris: Secondary | ICD-10-CM | POA: Diagnosis not present

## 2016-12-25 DIAGNOSIS — K219 Gastro-esophageal reflux disease without esophagitis: Secondary | ICD-10-CM | POA: Diagnosis not present

## 2016-12-28 ENCOUNTER — Encounter: Payer: Self-pay | Admitting: Pulmonary Disease

## 2016-12-28 ENCOUNTER — Ambulatory Visit (INDEPENDENT_AMBULATORY_CARE_PROVIDER_SITE_OTHER): Payer: Medicare HMO | Admitting: Pulmonary Disease

## 2016-12-28 ENCOUNTER — Other Ambulatory Visit (INDEPENDENT_AMBULATORY_CARE_PROVIDER_SITE_OTHER): Payer: Medicare HMO

## 2016-12-28 DIAGNOSIS — J84112 Idiopathic pulmonary fibrosis: Secondary | ICD-10-CM

## 2016-12-28 LAB — SEDIMENTATION RATE: Sed Rate: 15 mm/hr (ref 0–20)

## 2016-12-28 NOTE — Patient Instructions (Signed)
High-resolution CT scan of the chest without contrast Schedule PFTs Blood work today - ESR, ANA, CCP

## 2016-12-28 NOTE — Progress Notes (Signed)
Subjective:    Patient ID: Patrick Knapp, male    DOB: 03-Oct-1927, 83 y.o.   MRN: [id number]  HPI  Patrick Knapp is an 83 year old never smoker presents for an evaluation of dyspnea on exertion He reports symptoms ongoing for about a year.  Dyspnea occurs on walking or physical exertion especially on an incline and improves on resting. He feels that this has been progressing for the past year, denies any chest pain. He had coronary artery disease with stent 10 years ago and again one year ago. He denies cough or sputum production, orthopnea or paroxysmal nocturnal dyspnea or pedal edema He denies seasonal allergies Include in [place] for 50 years before moving down to [place] to work at [employer]. 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 Reviewed lab work done by PCP which appears normal, no eosinophilia  I have reviewed current and prior imaging studies personally Chest x-ray 05/2014 shows poorly parenchymal pattern of subpleural fibrosis. CT chest 2011 shows diffuse right more than left predominantly peripheral and subpleural reticular fibrosis.  On ambulation, his saturation dropped from 97% to 94% and he was mildly dyspneic after walking 3 laps, heart rate increased from 73-97  Past Medical History:  Diagnosis Date  . Arthritis    "all the joints" (02/06/2016)  . Coronary artery disease    a. PCI with DES to mid LAD 7/17 b.post PTCA and stenting of his left circumflex artery in 12/08  . Enlarged prostate   . GERD (gastroesophageal reflux disease)   . Hyperlipidemia   . Hypertension   . Ischemia   . Shortness of breath   . Skin disorder    Occurs at times as rash, whelps, blister-like areas  with itching sometimes-has occurred for years , but can not find an exact dx or cause      Past Surgical History:  Procedure Laterality Date  . CARDIAC CATHETERIZATION N/A 02/06/2016   Procedure: Right/Left  Heart Cath and Coronary Angiography;  Surgeon: Tonny Bollman, MD;  Location: [facility] INVASIVE CV LAB;  Service: Cardiovascular;  Laterality: N/A;  . CATARACT EXTRACTION, BILATERAL Bilateral 2016  . CORONARY ANGIOPLASTY WITH STENT PLACEMENT  06/2007   Est. EF of 55-60%PTCA and stenting of his left circumflex artery in 12/08  . EYE SURGERY    . INGUINAL HERNIA REPAIR Left   . JOINT REPLACEMENT    . KNEE ARTHROSCOPY  1990s  . ORBITAL FRACTURE SURGERY Left 1957  . TONSILLECTOMY    . TOTAL KNEE ARTHROPLASTY Right 05/31/2014   Procedure: RIGHT TOTAL KNEE ARTHROPLASTY;  Surgeon: Loanne Drilling, MD;  Location: [facility];  Service: Orthopedics;  Laterality: Right;    Allergies  Allergen Reactions  . Chlorhexidine Gluconate Itching  . Iodinated Diagnostic Agents     Not really sure of the incident// patient denies allergy and has had catherterization without incident.     Social History   Social History  . Marital status: Married    Spouse name: N/A  . Number of children: N/A  . Years of education: N/A   Occupational History  . Not on file.   Social History Main Topics  . Smoking status: Never Smoker  . Smokeless tobacco: Never Used  . Alcohol use Yes     Comment: 02/06/2016 "might have a glass of beer or wine once/month"  . Drug use: No  . Sexual activity: Not on file   Other  Topics Concern  . Not on file   Social History Narrative  . No narrative on file      Family History  Problem Relation Age of Onset  . Heart failure Mother        possible  . Heart disease Mother   . Heart disease Father   . Coronary artery disease Brother   . Heart disease Brother      Review of Systems   Constitutional: negative for anorexia, fevers and sweats  Eyes: negative for irritation, redness and visual disturbance  Ears, nose, mouth, throat, and face: negative for earaches, epistaxis, nasal congestion and sore throat  Respiratory: negative for cough, dyspnea on exertion, sputum and  wheezing  Cardiovascular: negative for chest pain, dyspnea, lower extremity edema, orthopnea, palpitations and syncope  Gastrointestinal: negative for abdominal pain, constipation, diarrhea, melena, nausea and vomiting  Genitourinary:negative for dysuria, frequency and hematuria  Hematologic/lymphatic: negative for bleeding, easy bruising and lymphadenopathy  Musculoskeletal:negative for arthralgias, muscle weakness and stiff joints  Neurological: negative for coordination problems, gait problems, headaches and weakness  Endocrine: negative for diabetic symptoms including polydipsia, polyuria and weight loss     Objective:   Physical Exam   Gen. Pleasant, well-nourished, in no distress, normal affect ENT - no lesions, no post nasal drip Neck: No JVD, no thyromegaly, no carotid bruits Lungs: no use of accessory muscles, no dullness to percussion, bibasal 1/3 rales, no rhonchi  Cardiovascular: Rhythm regular, heart sounds  normal, no murmurs or gallops, no peripheral edema Abdomen: soft and non-tender, no hepatosplenomegaly, BS normal. Musculoskeletal: No deformities, no cyanosis or clubbing Neuro:  alert, non focal        Assessment & Plan:

## 2016-12-28 NOTE — Assessment & Plan Note (Signed)
Appearance dates back to 2011, but appears then was not typical of IPF- although it has the peripheral and subpleural distribution, it does not appear to be predominantly bibasal. However in this age group, this is still probably IPF  We'll proceed with high-resolution CT of the chest, PFTs and ANA, CCP testing to rule out inflammatory etiologies. He does not have any history of polyarthritis

## 2016-12-31 LAB — CYCLIC CITRUL PEPTIDE ANTIBODY, IGG: Cyclic Citrullin Peptide Ab: <16 Units

## 2016-12-31 LAB — ANA: Anti Nuclear Antibody(ANA): NEGATIVE

## 2017-01-01 ENCOUNTER — Ambulatory Visit (INDEPENDENT_AMBULATORY_CARE_PROVIDER_SITE_OTHER)
Admission: RE | Admit: 2017-01-01 | Discharge: 2017-01-01 | Disposition: A | Discharge status: Home / Self Care | Payer: Medicare HMO | Source: Ambulatory Visit | Attending: Pulmonary Disease | Admitting: Pulmonary Disease

## 2017-01-01 DIAGNOSIS — R0600 Dyspnea, unspecified: Secondary | ICD-10-CM | POA: Diagnosis not present

## 2017-01-01 DIAGNOSIS — J84112 Idiopathic pulmonary fibrosis: Secondary | ICD-10-CM | POA: Diagnosis not present

## 2017-01-18 ENCOUNTER — Encounter: Payer: Self-pay | Admitting: Adult Health

## 2017-01-18 ENCOUNTER — Ambulatory Visit (INDEPENDENT_AMBULATORY_CARE_PROVIDER_SITE_OTHER): Payer: Medicare HMO | Admitting: Adult Health

## 2017-01-18 ENCOUNTER — Ambulatory Visit: Payer: Medicare HMO | Admitting: Pulmonary Disease

## 2017-01-18 DIAGNOSIS — J84112 Idiopathic pulmonary fibrosis: Secondary | ICD-10-CM

## 2017-01-18 DIAGNOSIS — K219 Gastro-esophageal reflux disease without esophagitis: Secondary | ICD-10-CM | POA: Diagnosis not present

## 2017-01-18 NOTE — Progress Notes (Signed)
@Patient  ID: Patrick Knapp, male    DOB: [birth year], 83 y.o.   MRN: [id number]  Chief Complaint  Patient presents with  . Follow-up    IPF     Referring provider: Chilton Greathouse, MD  HPI: 83 yo male never smoker Seen for pulmonary consult 12/28/2016 for dyspnea with exertion. An abnormal chest x-ray   TEST  CT chest 2011 showed diffuse right greater than left peripheral and subpleural reticular fibrosis. Ambulatory O2 saturation on room air without desaturation 12/28/2016   01/18/2017 Follow up : IPF  Patient returns for a one-month follow-up. Patient was seen last visit for pulmonary consult for progressive shortness of breath and abnormal chest x-ray. Complains of over last 10 year has noticed a progressive doe. Minimally dry cough that comes and goes. Chest x-ray had showed some fibrosis. A previous CT chest in 2011 showed diffuse right greater than left peripheral and subpleural reticular fibrosis. Patient was set up for a high resolution CT chest that showed fibrotic interstitial lung disease with mild honeycombing. Slight basilar predominance and slight asymmetrical involvement of the right lung with some progression since 2011. Autoimmune /CTD neg. Last ov with no desats on room air walking .  Gets winded with prolonged walking and incline. And carrying heavy objects.  Denies fever, chest pain, orthopnea, PND, or increased leg swelling. Discussed his CT results and lab results with patient and daughter  Allergies  Allergen Reactions  . Chlorhexidine Gluconate Itching  . Iodinated Diagnostic Agents     Not really sure of the incident// patient denies allergy and has had catherterization without incident.    Immunization History  Administered Date(s) Administered  . Influenza, High Dose Seasonal PF 04/08/2016    Past Medical History:  Diagnosis Date  . Arthritis    "all the joints" (02/06/2016)  . Coronary artery disease    a. PCI with DES to mid LAD 7/17  b.post PTCA and stenting of his left circumflex artery in 12/08  . Enlarged prostate   . GERD (gastroesophageal reflux disease)   . Hyperlipidemia   . Hypertension   . Ischemia   . Shortness of breath   . Skin disorder    Occurs at times as rash, whelps, blister-like areas  with itching sometimes-has occurred for years , but can not find an exact dx or cause    Tobacco History: History  Smoking Status  . Never Smoker  Smokeless Tobacco  . Never Used   Counseling given: Not Answered   Outpatient Encounter Prescriptions as of 01/18/2017  Medication Sig  . acetaminophen (TYLENOL) 325 MG tablet Take 2 tablets (650 mg total) by mouth every 6 (six) hours as needed for mild pain (or Fever >/= 101).  Marland Kitchen ALPRAZolam (XANAX) 0.5 MG tablet Take 0.5 mg by mouth 3 (three) times daily as needed. AS NEEDED FOR ANXIETY  . amLODipine (NORVASC) 2.5 MG tablet Take 1 tablet (2.5 mg total) by mouth daily.  Marland Kitchen atorvastatin (LIPITOR) 20 MG tablet Take 20 mg by mouth daily.   . cholecalciferol (VITAMIN D) 1000 units tablet Take 1,000 Units by mouth daily.  . clopidogrel (PLAVIX) 75 MG tablet Take 1 tablet (75 mg total) by mouth daily.  Marland Kitchen doxazosin (CARDURA) 4 MG tablet Take 2 mg by mouth every morning.   . isosorbide mononitrate (IMDUR) 30 MG 24 hr tablet Take 1 tablet (30 mg total) by mouth daily.  . magnesium gluconate (MAGONATE) 500 MG tablet Take 500 mg by mouth daily.  . metoprolol (  LOPRESSOR) 50 MG tablet Take 25 mg by mouth 2 (two) times daily. Take 1/2 tablet by mouth twice daily  . nitroGLYCERIN (NITROSTAT) 0.4 MG SL tablet Place 1 tablet (0.4 mg total) under the tongue every 5 (five) minutes as needed for chest pain.  . pantoprazole (PROTONIX) 40 MG tablet Take 40 mg by mouth at bedtime. DAILY AT HS  . psyllium (REGULOID) 0.52 g capsule Take 0.52 g by mouth daily.   No facility-administered encounter medications on file as of 01/18/2017.      Review of Systems  Constitutional:   No  weight  loss, night sweats,  Fevers, chills,  +fatigue, or  lassitude.  HEENT:   No headaches,  Difficulty swallowing,  Tooth/dental problems, or  Sore throat,                No sneezing, itching, ear ache, nasal congestion, post nasal drip,   CV:  No chest pain,  Orthopnea, PND, swelling in lower extremities, anasarca, dizziness, palpitations, syncope.   GI  No heartburn, indigestion, abdominal pain, nausea, vomiting, diarrhea, change in bowel habits, loss of appetite, bloody stools.   Resp:   No wheezing.  No chest wall deformity  Skin: no rash or lesions.  GU: no dysuria, change in color of urine, no urgency or frequency.  No flank pain, no hematuria   MS:  No joint pain or swelling.  No decreased range of motion.  No back pain.    Physical Exam  BP 126/74 (BP Location: Left Arm, Cuff Size: Normal)   Pulse (!) 50   Ht 5' 7.5" (1.715 m)   Wt 191 lb 3.2 oz (86.7 kg)   SpO2 95%   BMI 29.50 kg/m   GEN: A/Ox3; pleasant , NAD, well nourished    HEENT:  [place]/AT,  EACs-clear, TMs-wnl, NOSE-clear, THROAT-clear, no lesions, no postnasal drip or exudate noted.   NECK:  Supple w/ fair ROM; no JVD; normal carotid impulses w/o bruits; no thyromegaly or nodules palpated; no lymphadenopathy.    RESP  Clear  P & A; w/o, wheezes/ rales/ or rhonchi. no accessory muscle use, no dullness to percussion  CARD:  RRR, no m/r/g, tr  peripheral edema, pulses intact, no cyanosis or clubbing.  GI:   Soft & nt; nml bowel sounds; no organomegaly or masses detected.   Musco: Warm bil, no deformities or joint swelling noted.   Neuro: alert, no focal deficits noted.    Skin: Warm, no lesions or rashes    Lab Results:  CBC    Component Value Date/Time   WBC 7.2 02/07/2016 0256   RBC 4.83 02/07/2016 0256   HGB 13.6 02/07/2016 0256   HCT 42.3 02/07/2016 0256   PLT 105 (L) 02/07/2016 0256   MCV 87.6 02/07/2016 0256   MCH 28.2 02/07/2016 0256   MCHC 32.2 02/07/2016 0256   RDW 13.8 02/07/2016 0256    LYMPHSABS 0.2 (L) 08/09/2009 0400   MONOABS 0.5 08/09/2009 0400   EOSABS 0.0 08/09/2009 0400   BASOSABS 0.0 08/09/2009 0400    BMET    Component Value Date/Time   NA 141 03/26/2016 0747   K 4.2 03/26/2016 0747   CL 106 03/26/2016 0747   CO2 26 03/26/2016 0747   GLUCOSE 108 (H) 03/26/2016 0747   BUN 22 03/26/2016 0747   CREATININE 1.21 (H) 03/26/2016 0747   CALCIUM 9.1 03/26/2016 0747   GFRNONAA 49 (L) 02/07/2016 0256   GFRAA 57 (L) 02/07/2016 0256    BNP No  results found for: BNP  ProBNP No results found for: PROBNP  Imaging: Ct Chest High Resolution  Result Date: 01/01/2017 CLINICAL DATA:  Worsening dyspnea on exertion. Reported history of idiopathic pulmonary fibrosis. EXAM: CT CHEST WITHOUT CONTRAST TECHNIQUE: Multidetector CT imaging of the chest was performed following the standard protocol without intravenous contrast. High resolution imaging of the lungs, as well as inspiratory and expiratory imaging, was performed. COMPARISON:  05/24/2014 chest radiograph. 08/09/2009 chest CT angiogram. FINDINGS: Cardiovascular: Top-normal heart size. No significant pericardial fluid/thickening. Left anterior descending, left circumflex and right coronary atherosclerosis. Thoracic aortic atherosclerosis. Ectatic 4.0 cm ascending thoracic aorta, mildly increased, previously 3.8 cm on 08/09/2009. Top-normal size main pulmonary artery (3.1 cm diameter). Mediastinum/Nodes: No discrete thyroid nodules. Unremarkable esophagus. No pathologically enlarged axillary, mediastinal or gross hilar lymph nodes, noting limited sensitivity for the detection of hilar adenopathy on this noncontrast study. Lungs/Pleura: No pneumothorax. No pleural effusion. No acute consolidative airspace disease, lung masses or significant pulmonary nodules. There is patchy confluent subpleural reticulation and mild ground-glass attenuation throughout both lungs with associated mild traction bronchiectasis and bronchiolectasis.  There is a slight basilar gradient to the findings, which slightly asymmetrically involve the right lung. There is mild honeycombing in the dependent basilar right lower lobe. These findings have progressed since 08/09/2009 chest CT angiogram. No significant air trapping on the expiration sequence. Upper abdomen: Simple 1.6 cm liver cyst in the inferior lateral segment left liver lobe. Simple appearing renal cysts in the upper right kidney, largest 5.4 cm in the posterior upper right kidney. Musculoskeletal: No aggressive appearing focal osseous lesions. Moderate thoracic spondylosis. IMPRESSION: 1. Fibrotic interstitial lung disease with mild honeycombing, slight basilar predominance, slight asymmetric involvement of the right lung and progression since 2011 chest CT, considered diagnostic of usual interstitial pneumonia (UIP). 2. Ectatic 4.0 cm ascending thoracic aorta, mildly increased since 2011. 3. Three-vessel coronary atherosclerosis. Aortic Atherosclerosis (ICD10-I70.0). Electronically Signed   By: Delbert PhenixJason A Poff M.D.   On: 01/01/2017 09:55     Assessment & Plan:   IPF (idiopathic pulmonary fibrosis) (HCC) High resolution CT chest show IPF changes. There has been some slight progression since 2011. Patient does not have any exertional hypoxemia. Is relatively independent and mildly active. Autoimmune workup has been negative. For now, we'll check pulmonary function test We did discuss potential treatment options including anti-fibrotic's. Once all of his testing are return can decide if we want to continue to monitor serially or begin therapy   Plan  Patient Instructions  Set up PFT . Refer to pulmonary rehab .  Follow up Dr. Vassie LollAlva  In 3 months and As needed      GERD (gastroesophageal reflux disease) Controlled on PPI      Patrick Oaksammy Weylyn Ricciuti, NP 01/18/2017

## 2017-01-18 NOTE — Assessment & Plan Note (Signed)
High resolution CT chest show IPF changes. There has been some slight progression since 2011. Patient does not have any exertional hypoxemia. Is relatively independent and mildly active. Autoimmune workup has been negative. For now, we'll check pulmonary function test We did discuss potential treatment options including anti-fibrotic's. Once all of his testing are return can decide if we want to continue to monitor serially or begin therapy   Plan  Patient Instructions  Set up PFT . Refer to pulmonary rehab .  Follow up Dr. Vassie LollAlva  In 3 months and As needed

## 2017-01-18 NOTE — Assessment & Plan Note (Signed)
Controlled on PPI °

## 2017-01-18 NOTE — Patient Instructions (Addendum)
Set up PFT . Refer to pulmonary rehab .  Follow up Dr. Vassie LollAlva  In 3 months and As needed

## 2017-01-22 ENCOUNTER — Telehealth: Payer: Self-pay | Admitting: Pulmonary Disease

## 2017-01-22 ENCOUNTER — Ambulatory Visit (INDEPENDENT_AMBULATORY_CARE_PROVIDER_SITE_OTHER): Payer: Medicare HMO | Admitting: Pulmonary Disease

## 2017-01-22 DIAGNOSIS — J84112 Idiopathic pulmonary fibrosis: Secondary | ICD-10-CM | POA: Diagnosis not present

## 2017-01-22 LAB — PULMONARY FUNCTION TEST
FEF 25-75 Post: 2.84 L/sec
FEF 25-75 Pre: 2.65 L/sec
FEF2575-%Change-Post: 7 %
FEF2575-%Pred-Post: 207 %
FEF2575-%Pred-Pre: 193 %
FEV1-%CHANGE-POST: -3 %
FEV1-%Pred-Post: 95 %
FEV1-%Pred-Pre: 98 %
FEV1-Post: 2.11 L
FEV1-Pre: 2.19 L
FEV1FVC-%Change-Post: -3 %
FEV1FVC-%Pred-Pre: 126 %
FEV6-%Change-Post: -3 %
FEV6-%PRED-PRE: 82 %
FEV6-%Pred-Post: 79 %
FEV6-POST: 2.37 L
FEV6-Pre: 2.45 L
FEV6FVC-%PRED-POST: 108 %
FEV6FVC-%Pred-Pre: 108 %
FVC-%Change-Post: 0 %
FVC-%PRED-POST: 76 %
FVC-%PRED-PRE: 76 %
FVC-POST: 2.48 L
FVC-PRE: 2.47 L
POST FEV6/FVC RATIO: 100 %
PRE FEV1/FVC RATIO: 89 %
PRE FEV6/FVC RATIO: 100 %
Post FEV1/FVC ratio: 85 %
RV % PRED: 26 %
RV: 0.69 L
TLC % PRED: 49 %
TLC: 3.22 L

## 2017-01-22 NOTE — Progress Notes (Signed)
PFT done today. 

## 2017-01-22 NOTE — Progress Notes (Signed)
Reviewed & agree with plan  

## 2017-01-22 NOTE — Telephone Encounter (Signed)
atc pt on both home and mobile numbers, both numbers rang to a fast busy signal.  Wcb.

## 2017-01-22 NOTE — Telephone Encounter (Signed)
Patient returned call, CB is [phone].

## 2017-01-22 NOTE — Telephone Encounter (Signed)
Spoke with patient to try to find out further information but he stated that he wanted to speak to [place] and he states that this is just a question. Informed him that she was in PFT and is will not be able to call him back at this time. He stated that he can wait until tomorrow. Spoke with Lillia AbedLindsay and she stated that she will call the patient tomorrow. Will forward the message to her

## 2017-01-23 NOTE — Telephone Encounter (Signed)
Spoke with pt. He had some questions about his PFT that was performed yesterday. His questions were answered. Nothing further was needed.

## 2017-01-23 NOTE — Addendum Note (Signed)
Addended by: Boone MasterJONES, Jaizon Deroos E on: 01/23/2017 12:33 PM   Modules accepted: Orders

## 2017-01-24 ENCOUNTER — Telehealth: Payer: Self-pay | Admitting: Pulmonary Disease

## 2017-01-28 NOTE — Telephone Encounter (Signed)
Pt is requesting his PFT results from 01/21/17.  Please advise Dr Vassie LollAlva. Thanks.

## 2017-01-28 NOTE — Telephone Encounter (Signed)
Lung capacity was at 76%. DLCO could not be performed.  I would  favor a diagnosis of pulmonary fibrosis as we discussed during his office visit  we can discuss and consider starting anti-fibrotic therapy if he is interested or we can wait until his office visit-

## 2017-01-29 NOTE — Telephone Encounter (Signed)
lmtcb x1 for pt. 

## 2017-01-30 NOTE — Telephone Encounter (Signed)
Spoke with the pt and notified of recs per RA  Will keep future appt to discuss in more detail

## 2017-02-01 ENCOUNTER — Telehealth: Payer: Self-pay | Admitting: Pulmonary Disease

## 2017-02-01 NOTE — Telephone Encounter (Signed)
Spoke with pt, answered questions regarding 6mw tests. Nothing further needed at this time.

## 2017-02-05 ENCOUNTER — Encounter: Payer: Self-pay | Admitting: *Deleted

## 2017-02-05 ENCOUNTER — Other Ambulatory Visit: Payer: Self-pay

## 2017-02-05 DIAGNOSIS — Z006 Encounter for examination for normal comparison and control in clinical research program: Secondary | ICD-10-CM

## 2017-02-07 ENCOUNTER — Telehealth: Payer: Self-pay | Admitting: Pulmonary Disease

## 2017-02-07 DIAGNOSIS — J84112 Idiopathic pulmonary fibrosis: Secondary | ICD-10-CM

## 2017-02-07 NOTE — Progress Notes (Signed)
LEADERS FREE II Research study month 12 follow up visit completed. Patient denies Chest pain. No changed in medication. Follow up phone calls at year 2 and 3 are remaining requirements for the study.EKG obtained:

## 2017-02-07 NOTE — Telephone Encounter (Signed)
Order has been updated. Patrick Knapp is aware. Nothing further was needed.

## 2017-02-14 ENCOUNTER — Telehealth (HOSPITAL_COMMUNITY): Payer: Self-pay

## 2017-02-14 NOTE — Telephone Encounter (Signed)
Called patient in regards to Pulmonary Rehab referral. Patient states that [facility] [place] and he is going to request that they refer him to Pulmonary Rehab so hopefully it will be covered by them since his [employer] will not pay for rehab. He states that if they wont cover it then he will attend our Pulmonary Rehab Maintenance program which is self pay $68 per month. Patient states that he will follow up with me [facility].

## 2017-03-14 ENCOUNTER — Other Ambulatory Visit: Payer: Self-pay | Admitting: Physician Assistant

## 2017-04-05 ENCOUNTER — Encounter (HOSPITAL_COMMUNITY)
Admission: RE | Admit: 2017-04-05 | Discharge: 2017-04-05 | Disposition: A | Discharge status: Home / Self Care | Payer: Non-veteran care | Source: Ambulatory Visit | Attending: Pulmonary Disease | Admitting: Pulmonary Disease

## 2017-04-05 ENCOUNTER — Encounter (HOSPITAL_COMMUNITY): Payer: Self-pay

## 2017-04-05 VITALS — BP 131/76 | HR 67 | Resp 18 | Ht 65.5 in | Wt 194.4 lb

## 2017-04-05 DIAGNOSIS — J8410 Pulmonary fibrosis, unspecified: Secondary | ICD-10-CM

## 2017-04-05 DIAGNOSIS — J84112 Idiopathic pulmonary fibrosis: Secondary | ICD-10-CM | POA: Insufficient documentation

## 2017-04-05 NOTE — Progress Notes (Signed)
Patrick Knapp 83 y.o. male Pulmonary Rehab Orientation Note Patient arrived today in Cardiac and Pulmonary Rehab for orientation to Pulmonary Rehab. He ambulated from the [place] parking deck without difficulty. He has not been prescribed oxygen for home or portable use. Color good, skin warm and dry. Patient is oriented to time and place. Patient's medical history, psychosocial health, and medications reviewed. Psychosocial assessment reveals pt lives with their spouse. 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 Pt hobbies include making lamps. Pt reports he does not have any stress. Pt does not exhibit signs of depression. PHQ2/9 score 0/0. Pt shows good  coping skills with positive outlook. He is offered emotional support and reassurance. Will continue to monitor and evaluate progress toward psychosocial goal(s) of remaining positive about his "terminal" disease of IPF. Physical assessment reveals heart rate is normal, breath sounds clear to auscultation,in the upper lobes and course crackles in her lower, L>R.. Grip strength equal, strong. Distal pulses palpable. Trace pitting edema noted to ankles. Patient reports he does take medications as prescribed. Patient states he follows a Regular diet. The patient has been trying to lose weight through a healthy diet and exercise program.. Patient's weight will be monitored closely. Demonstration and practice of PLB using pulse oximeter. Patient able to return demonstration satisfactorily. Safety and hand hygiene in the exercise area reviewed with patient. Patient voices understanding of the information reviewed. Department expectations discussed with patient and achievable goals were set. The patient shows enthusiasm about attending the program and we look forward to working with this nice gentleman. The patient is scheduled for a 6 min walk test on 04/09/17 and to begin exercise on 04/16/17  at 10:30.   45 minutes was spent on a variety of activities such as assessment of the patient, obtaining baseline data including height, weight, BMI, and grip strength, verifying medical history, allergies, and current medications, and teaching patient strategies for performing tasks with less respiratory effort with emphasis on pursed lip breathing.

## 2017-04-09 ENCOUNTER — Encounter (HOSPITAL_COMMUNITY)
Admission: RE | Admit: 2017-04-09 | Discharge: 2017-04-09 | Disposition: A | Discharge status: Home / Self Care | Payer: Non-veteran care | Source: Ambulatory Visit | Attending: Pulmonary Disease | Admitting: Pulmonary Disease

## 2017-04-09 DIAGNOSIS — J84112 Idiopathic pulmonary fibrosis: Secondary | ICD-10-CM | POA: Diagnosis not present

## 2017-04-09 DIAGNOSIS — J8410 Pulmonary fibrosis, unspecified: Secondary | ICD-10-CM

## 2017-04-10 ENCOUNTER — Encounter (HOSPITAL_COMMUNITY): Payer: Self-pay | Admitting: *Deleted

## 2017-04-11 NOTE — Progress Notes (Signed)
Pulmonary Individual Treatment Plan  Patient Details  Name: Patrick Knapp MRN: [id number] Date of Birth: 18-Jun-1928 Referring Provider:     Pulmonary Rehab Walk Test from 04/09/2017 in [facility]  Referring Provider  Dr. Molli Knock      Initial Encounter Date:    Pulmonary Rehab Walk Test from 04/09/2017 in [facility] CARD[facility]  Date  04/09/17  Referring Provider  Dr. Molli Knock      Visit Diagnosis: Pulmonary fibrosis (HCC)  Patient's Home Medications on Admission:   Current Outpatient Prescriptions:  .  acetaminophen (TYLENOL) 325 MG tablet, Take 2 tablets (650 mg total) by mouth every 6 (six) hours as needed for mild pain (or Fever >/= 101)., Disp: 40 tablet, Rfl: 0 .  ALPRAZolam (XANAX) 0.5 MG tablet, Take 0.5 mg by mouth 3 (three) times daily as needed. AS NEEDED FOR ANXIETY, Disp: , Rfl:  .  amLODipine (NORVASC) 2.5 MG tablet, Take 1 tablet (2.5 mg total) by mouth daily., Disp: 90 tablet, Rfl: 3 .  atorvastatin (LIPITOR) 20 MG tablet, Take 20 mg by mouth daily. , Disp: , Rfl:  .  cholecalciferol (VITAMIN D) 1000 units tablet, Take 1,000 Units by mouth daily., Disp: , Rfl:  .  clopidogrel (PLAVIX) 75 MG tablet, Take 1 tablet (75 mg total) by mouth daily., Disp: 30 tablet, Rfl: 11 .  doxazosin (CARDURA) 4 MG tablet, Take 2 mg by mouth every morning. , Disp: , Rfl:  .  isosorbide mononitrate (IMDUR) 30 MG 24 hr tablet, Take 1 tablet (30 mg total) by mouth daily., Disp: 90 tablet, Rfl: 2 .  magnesium gluconate (MAGONATE) 500 MG tablet, Take 500 mg by mouth daily., Disp: , Rfl:  .  metoprolol tartrate (LOPRESSOR) 50 MG tablet, Take 0.5 tablets (25 mg total) by mouth 2 (two) times daily., Disp: 90 tablet, Rfl: 1 .  nitroGLYCERIN (NITROSTAT) 0.4 MG SL tablet, Place 1 tablet (0.4 mg total) under the tongue every 5 (five) minutes as needed for chest pain., Disp: 25 tablet, Rfl: 3 .  pantoprazole (PROTONIX) 40 MG tablet, Take 40 mg by  mouth at bedtime. DAILY AT HS, Disp: , Rfl:  .  psyllium (REGULOID) 0.52 g capsule, Take 0.52 g by mouth daily., Disp: , Rfl:   Past Medical History: Past Medical History:  Diagnosis Date  . Arthritis    "all the joints" (02/06/2016)  . Coronary artery disease    a. PCI with DES to mid LAD 7/17 b.post PTCA and stenting of his left circumflex artery in 12/08  . Enlarged prostate   . GERD (gastroesophageal reflux disease)   . Hyperlipidemia   . Hypertension   . Ischemia   . Shortness of breath   . Skin disorder    Occurs at times as rash, whelps, blister-like areas  with itching sometimes-has occurred for years , but can not find an exact dx or cause    Tobacco Use: History  Smoking Status  . Never Smoker  Smokeless Tobacco  . Never Used    Labs: Recent Review Flowsheet Data    Labs for ITP Cardiac and Pulmonary Rehab Latest Ref Rng & Units 06/14/2007 01/05/2011 03/17/2014 02/06/2016 02/06/2016   Cholestrol 0 - 200 mg/dL - 782 956 - -   LDLCALC 0 - 99 mg/dL - 53 49 - -   HDL >21.30 mg/dL - 86.57 84.69(G) - -   Trlycerides 0.0 - 149.0 mg/dL - 295.2 841.3 - -   PHART 7.350 -  7.450 - - - - 7.336(L)   PCO2ART 35.0 - 45.0 mmHg - - - - 44.4   HCO3 20.0 - 24.0 mEq/L 23.3 - - 23.2 23.8   TCO2 0 - 100 mmol/L 24 - - 25 25   ACIDBASEDEF 0.0 - 2.0 mmol/L 2.0 - - 3.0(H) 2.0   O2SAT % - - - 72.0 97.0      Capillary Blood Glucose: No results found for: GLUCAP   Pulmonary Assessment Scores:     Pulmonary Assessment Scores    Row Name 04/10/17 1130 04/11/17 0704       ADL UCSD   ADL Phase Entry Entry    SOB Score total 37  -      CAT Score   CAT Score 17  Entry  -      mMRC Score   mMRC Score  - 1       Pulmonary Function Assessment:     Pulmonary Function Assessment - 04/05/17 1010      Breath   Bilateral Breath Sounds Other   Other course crackles in bases bilaterally   Shortness of Breath Yes;Limiting activity      Exercise Target Goals: Date:  04/09/17  Exercise Program Goal: Individual exercise prescription set with THRR, safety & activity barriers. Participant demonstrates ability to understand and report RPE using BORG scale, to self-measure pulse accurately, and to acknowledge the importance of the exercise prescription.  Exercise Prescription Goal: Starting with aerobic activity 30 plus minutes a day, 3 days per week for initial exercise prescription. Provide home exercise prescription and guidelines that participant acknowledges understanding prior to discharge.  Activity Barriers & Risk Stratification:     Activity Barriers & Cardiac Risk Stratification - 04/05/17 0956      Activity Barriers & Cardiac Risk Stratification   Activity Barriers Shortness of Breath;Arthritis      6 Minute Walk:     6 Minute Walk    Row Name 04/11/17 0700         6 Minute Walk   Phase Initial     Distance 1540 feet     Walk Time 6 minutes     # of Rest Breaks 0     MPH 2.91     METS 3.22     RPE 13     Perceived Dyspnea  3     Symptoms No     Resting HR 72 bpm     Resting BP 165/73     Resting Oxygen Saturation  95 %     Exercise Oxygen Saturation  during 6 min walk 90 %     Max Ex. HR 121 bpm     Max Ex. BP 220/78     2 Minute Post BP 175/77       Interval HR   1 Minute HR 97     2 Minute HR 100     3 Minute HR 107     4 Minute HR 121     5 Minute HR 114     6 Minute HR 119     2 Minute Post HR 119     Interval Heart Rate? Yes       Interval Oxygen   Interval Oxygen? Yes     Baseline Oxygen Saturation % 95 %     1 Minute Oxygen Saturation % 94 %     1 Minute Liters of Oxygen 0 L     2 Minute Oxygen Saturation % 91 %  2 Minute Liters of Oxygen 0 L     3 Minute Oxygen Saturation % 91 %     3 Minute Liters of Oxygen 0 L     4 Minute Oxygen Saturation % 90 %     4 Minute Liters of Oxygen 0 L     5 Minute Oxygen Saturation % 91 %     5 Minute Liters of Oxygen 0 L     6 Minute Oxygen Saturation % 90 %      6 Minute Liters of Oxygen 0 L     2 Minute Post Oxygen Saturation % 91 %     2 Minute Post Liters of Oxygen 0 L        Oxygen Initial Assessment:     Oxygen Initial Assessment - 04/11/17 0704      Initial 6 min Walk   Oxygen Used None     Program Oxygen Prescription   Program Oxygen Prescription None      Oxygen Re-Evaluation:   Oxygen Discharge (Final Oxygen Re-Evaluation):   Initial Exercise Prescription:     Initial Exercise Prescription - 04/11/17 0700      Date of Initial Exercise RX and Referring Provider   Date 04/09/17   Referring Provider Dr. Molli Knock     Bike   Level 0.4   Minutes 17     NuStep   Level 2   Minutes 17   METs 1.5     Track   Laps 10   Minutes 17     Prescription Details   Frequency (times per week) 2     Intensity   THRR 40-80% of Max Heartrate 54-107   Ratings of Perceived Exertion 11-13   Perceived Dyspnea 0-4     Progression   Progression Continue progressive overload as per policy without signs/symptoms or physical distress.     Resistance Training   Training Prescription Yes   Weight orange bands   Reps 10-15      Perform Capillary Blood Glucose checks as needed.  Exercise Prescription Changes:   Exercise Comments:   Exercise Goals and Review:     Exercise Goals    Row Name 04/05/17 0958             Exercise Goals   Increase Physical Activity Yes       Intervention Provide advice, education, support and counseling about physical activity/exercise needs.;Develop an individualized exercise prescription for aerobic and resistive training based on initial evaluation findings, risk stratification, comorbidities and participant's personal goals.       Expected Outcomes Achievement of increased cardiorespiratory fitness and enhanced flexibility, muscular endurance and strength shown through measurements of functional capacity and personal statement of participant.       Increase Strength and Stamina Yes        Intervention Provide advice, education, support and counseling about physical activity/exercise needs.;Develop an individualized exercise prescription for aerobic and resistive training based on initial evaluation findings, risk stratification, comorbidities and participant's personal goals.       Expected Outcomes Achievement of increased cardiorespiratory fitness and enhanced flexibility, muscular endurance and strength shown through measurements of functional capacity and personal statement of participant.       Able to understand and use rate of perceived exertion (RPE) scale Yes       Intervention Provide education and explanation on how to use RPE scale       Expected Outcomes Short Term: Able to use RPE daily in rehab  to express subjective intensity level;Long Term:  Able to use RPE to guide intensity level when exercising independently       Able to understand and use Dyspnea scale Yes       Intervention Provide education and explanation on how to use Dyspnea scale       Expected Outcomes Short Term: Able to use Dyspnea scale daily in rehab to express subjective sense of shortness of breath during exertion;Long Term: Able to use Dyspnea scale to guide intensity level when exercising independently       Knowledge and understanding of Target Heart Rate Range (THRR) Yes       Intervention Provide education and explanation of THRR including how the numbers were predicted and where they are located for reference       Expected Outcomes Short Term: Able to state/look up THRR;Long Term: Able to use THRR to govern intensity when exercising independently;Short Term: Able to use daily as guideline for intensity in rehab       Understanding of Exercise Prescription Yes       Intervention Provide education, explanation, and written materials on patient's individual exercise prescription       Expected Outcomes Short Term: Able to explain program exercise prescription;Long Term: Able to explain home exercise  prescription to exercise independently          Exercise Goals Re-Evaluation :   Discharge Exercise Prescription (Final Exercise Prescription Changes):   Nutrition:  Target Goals: Understanding of nutrition guidelines, daily intake of sodium 1500mg , cholesterol 200mg , calories 30% from fat and 7% or less from saturated fats, daily to have 5 or more servings of fruits and vegetables.  Biometrics:     Pre Biometrics - 04/05/17 1000      Pre Biometrics   Grip Strength 26 kg       Nutrition Therapy Plan and Nutrition Goals:   Nutrition Discharge: Rate Your Plate Scores:   Nutrition Goals Re-Evaluation:   Nutrition Goals Discharge (Final Nutrition Goals Re-Evaluation):   Psychosocial: Target Goals: Acknowledge presence or absence of significant depression and/or stress, maximize coping skills, provide positive support system. Participant is able to verbalize types and ability to use techniques and skills needed for reducing stress and depression.  Initial Review & Psychosocial Screening:     Initial Psych Review & Screening - 04/05/17 1011      Initial Review   Current issues with None Identified     Family Dynamics   Good Support System? Yes     Barriers   Psychosocial barriers to participate in program There are no identifiable barriers or psychosocial needs.     Screening Interventions   Interventions Encouraged to exercise      Quality of Life Scores:   PHQ-9: Recent Review Flowsheet Data    Depression screen [facility] 2/9 04/05/2017   Decreased Interest 0   Down, Depressed, Hopeless 0   PHQ - 2 Score 0     Interpretation of Total Score  Total Score Depression Severity:  1-4 = Minimal depression, 5-9 = Mild depression, 10-14 = Moderate depression, 15-19 = Moderately severe depression, 20-27 = Severe depression   Psychosocial Evaluation and Intervention:     Psychosocial Evaluation - 04/05/17 1014      Psychosocial Evaluation & Interventions    Interventions Encouraged to exercise with the program and follow exercise prescription   Expected Outcomes patient will remain free from psychosocial barriers to participation   Continue Psychosocial Services  No Follow up required  Psychosocial Re-Evaluation:   Psychosocial Discharge (Final Psychosocial Re-Evaluation):   Education: Education Goals: Education classes will be provided on a weekly basis, covering required topics. Participant will state understanding/return demonstration of topics presented.  Learning Barriers/Preferences:     Learning Barriers/Preferences - 04/05/17 1009      Learning Barriers/Preferences   Learning Barriers None   Learning Preferences Written Material;Verbal Instruction;Group Instruction;Computer/Internet      Education Topics: Risk Factor Reduction:  -Group instruction that is supported by a PowerPoint presentation. Instructor discusses the definition of a risk factor, different risk factors for pulmonary disease, and how the heart and lungs work together.     Nutrition for Pulmonary Patient:  -Group instruction provided by PowerPoint slides, verbal discussion, and written materials to support subject matter. The instructor gives an explanation and review of healthy diet recommendations, which includes a discussion on weight management, recommendations for fruit and vegetable consumption, as well as protein, fluid, caffeine, fiber, sodium, sugar, and alcohol. Tips for eating when patients are short of breath are discussed.   Pursed Lip Breathing:  -Group instruction that is supported by demonstration and informational handouts. Instructor discusses the benefits of pursed lip and diaphragmatic breathing and detailed demonstration on how to preform both.     Oxygen Safety:  -Group instruction provided by PowerPoint, verbal discussion, and written material to support subject matter. There is an overview of "What is Oxygen" and "Why do we need  it".  Instructor also reviews how to create a safe environment for oxygen use, the importance of using oxygen as prescribed, and the risks of noncompliance. There is a brief discussion on traveling with oxygen and resources the patient may utilize.   Oxygen Equipment:  -Group instruction provided by [facility] Staff utilizing handouts, written materials, and equipment demonstrations.   Signs and Symptoms:  -Group instruction provided by written material and verbal discussion to support subject matter. Warning signs and symptoms of infection, stroke, and heart attack are reviewed and when to call the physician/911 reinforced. Tips for preventing the spread of infection discussed.   Advanced Directives:  -Group instruction provided by verbal instruction and written material to support subject matter. Instructor reviews Advanced Directive laws and proper instruction for filling out document.   Pulmonary Video:  -Group video education that reviews the importance of medication and oxygen compliance, exercise, good nutrition, pulmonary hygiene, and pursed lip and diaphragmatic breathing for the pulmonary patient.   Exercise for the Pulmonary Patient:  -Group instruction that is supported by a PowerPoint presentation. Instructor discusses benefits of exercise, core components of exercise, frequency, duration, and intensity of an exercise routine, importance of utilizing pulse oximetry during exercise, safety while exercising, and options of places to exercise outside of rehab.     Pulmonary Medications:  -Verbally interactive group education provided by instructor with focus on inhaled medications and proper administration.   Anatomy and Physiology of the Respiratory System and Intimacy:  -Group instruction provided by PowerPoint, verbal discussion, and written material to support subject matter. Instructor reviews respiratory cycle and anatomical components of the respiratory system and their  functions. Instructor also reviews differences in obstructive and restrictive respiratory diseases with examples of each. Intimacy, Sex, and Sexuality differences are reviewed with a discussion on how relationships can change when diagnosed with pulmonary disease. Common sexual concerns are reviewed.   MD DAY -A group question and answer session with a medical doctor that allows participants to ask questions that relate to their pulmonary disease state.   OTHER  EDUCATION -Group or individual verbal, written, or video instructions that support the educational goals of the pulmonary rehab program.   Knowledge Questionnaire Score:     Knowledge Questionnaire Score - 04/10/17 1130      Knowledge Questionnaire Score   Pre Score 11/13      Core Components/Risk Factors/Patient Goals at Admission:     Personal Goals and Risk Factors at Admission - 04/05/17 1011      Core Components/Risk Factors/Patient Goals on Admission   Improve shortness of breath with ADL's Yes   Intervention Provide education, individualized exercise plan and daily activity instruction to help decrease symptoms of SOB with activities of daily living.   Expected Outcomes Short Term: Achieves a reduction of symptoms when performing activities of daily living.   Develop more efficient breathing techniques such as purse lipped breathing and diaphragmatic breathing; and practicing self-pacing with activity Yes   Intervention Provide education, demonstration and support about specific breathing techniuqes utilized for more efficient breathing. Include techniques such as pursed lipped breathing, diaphragmatic breathing and self-pacing activity.   Expected Outcomes Short Term: Participant will be able to demonstrate and use breathing techniques as needed throughout daily activities.   Increase knowledge of respiratory medications and ability to use respiratory devices properly  Yes   Intervention Provide education and  demonstration as needed of appropriate use of medications, inhalers, and oxygen therapy.   Expected Outcomes Short Term: Achieves understanding of medications use. Understands that oxygen is a medication prescribed by physician. Demonstrates appropriate use of inhaler and oxygen therapy.      Core Components/Risk Factors/Patient Goals Review:    Core Components/Risk Factors/Patient Goals at Discharge (Final Review):    ITP Comments:   Comments:

## 2017-04-16 ENCOUNTER — Encounter (HOSPITAL_COMMUNITY)
Admission: RE | Admit: 2017-04-16 | Discharge: 2017-04-16 | Disposition: A | Discharge status: Home / Self Care | Payer: Non-veteran care | Source: Ambulatory Visit | Attending: Pulmonary Disease | Admitting: Pulmonary Disease

## 2017-04-16 VITALS — Wt 192.2 lb

## 2017-04-16 DIAGNOSIS — J84112 Idiopathic pulmonary fibrosis: Secondary | ICD-10-CM | POA: Diagnosis not present

## 2017-04-16 DIAGNOSIS — J8410 Pulmonary fibrosis, unspecified: Secondary | ICD-10-CM

## 2017-04-16 NOTE — Progress Notes (Signed)
Daily Session Note  Patient Details  Name: Patrick Knapp MRN: [id number] Date of Birth: Nov 19, 1927 Referring Provider:     Pulmonary Rehab Walk Test from 04/09/2017 in [place]  Referring Provider  Dr. Nelda Marseille      Encounter Date: 04/16/2017  Check In:     Session Check In - 04/16/17 1324      Check-In   Location [facility]   Staff Present Su Hilt, MS, ACSM RCEP, Exercise Physiologist;Portia Rollene Rotunda, RN, BSN   Supervising physician immediately available to respond to emergencies [facility] immediately available   Physician(s) Dr. Bradd Burner Blood Glucose: No results found for this or any previous visit (from the past 24 hour(s)).      Exercise Prescription Changes - 04/16/17 1300      Response to Exercise   Blood Pressure (Admit) 120/62   Blood Pressure (Exercise) 170/64   Blood Pressure (Exit) 120/60   Heart Rate (Admit) 63 bpm   Heart Rate (Exercise) 83 bpm   Heart Rate (Exit) 71 bpm   Oxygen Saturation (Admit) 97 %   Oxygen Saturation (Exercise) 95 %   Oxygen Saturation (Exit) 97 %   Rating of Perceived Exertion (Exercise) 13   Perceived Dyspnea (Exercise) 2   Duration Continue with 45 min of aerobic exercise without signs/symptoms of physical distress.   Intensity THRR unchanged     Progression   Progression Continue to progress workloads to maintain intensity without signs/symptoms of physical distress.     Resistance Training   Training Prescription Yes   Weight orange bands   Reps 10-15     Bike   Level 0.6   Minutes 17     Track   Laps 12   Minutes 17      History  Smoking Status  . Never Smoker  Smokeless Tobacco  . Never Used    Goals Met:  Exercise tolerated well No report of cardiac concerns or symptoms Strength training completed today  Goals Unmet:  Not Applicable  Comments: Service time is from 10:30a to 12:30p    Dr. Rush Farmer is  Medical Director for Pulmonary Rehab at [facility].

## 2017-04-18 ENCOUNTER — Encounter (HOSPITAL_COMMUNITY)
Admission: RE | Admit: 2017-04-18 | Discharge: 2017-04-18 | Disposition: A | Discharge status: Home / Self Care | Payer: Non-veteran care | Source: Ambulatory Visit | Attending: Pulmonary Disease | Admitting: Pulmonary Disease

## 2017-04-18 VITALS — Wt 190.0 lb

## 2017-04-18 DIAGNOSIS — J84112 Idiopathic pulmonary fibrosis: Secondary | ICD-10-CM | POA: Diagnosis not present

## 2017-04-18 DIAGNOSIS — J8410 Pulmonary fibrosis, unspecified: Secondary | ICD-10-CM

## 2017-04-18 NOTE — Progress Notes (Signed)
Daily Session Note  Patient Details  Name: Patrick Knapp MRN: [id number] Date of Birth: 06/02/1928 Referring Provider:     Pulmonary Rehab Walk Test from 04/09/2017 in [facility]  Referring Provider  Dr. Yacoub      Encounter Date: 04/18/2017  Check In:     Session Check In - 04/18/17 1017      Check-In   Location [facility]   Staff Present Portia Payne, RN, BSN;Lisa Hughes, RN; , MS, ACSM RCEP, Exercise Physiologist   Supervising physician immediately available to respond to emergencies [facility] immediately available   Physician(s) Dr. Samtani   Medication changes reported     No   Fall or balance concerns reported    No   Tobacco Cessation No Change   Warm-up and Cool-down Performed as group-led instruction   Resistance Training Performed Yes   VAD Patient? No     Pain Assessment   Currently in Pain? No/denies   Multiple Pain Sites No      Capillary Blood Glucose: No results found for this or any previous visit (from the past 24 hour(s)).      Exercise Prescription Changes - 04/18/17 1200      Response to Exercise   Blood Pressure (Admit) 118/56   Blood Pressure (Exercise) 104/50   Blood Pressure (Exit) 100/50   Heart Rate (Admit) 47 bpm   Heart Rate (Exercise) 71 bpm   Heart Rate (Exit) 53 bpm   Oxygen Saturation (Admit) 98 %   Oxygen Saturation (Exercise) 91 %   Oxygen Saturation (Exit) 97 %   Rating of Perceived Exertion (Exercise) 15   Perceived Dyspnea (Exercise) 1   Duration Continue with 45 min of aerobic exercise without signs/symptoms of physical distress.   Intensity THRR unchanged     Progression   Progression Continue to progress workloads to maintain intensity without signs/symptoms of physical distress.     Resistance Training   Training Prescription Yes   Weight orange bands   Reps 10-15     Bike   Level 0.6   Minutes 17     NuStep   Level 2   Minutes  17   METs 2.1     Track   Laps 14   Minutes 17      History  Smoking Status  . Never Smoker  Smokeless Tobacco  . Never Used    Goals Met:  Exercise tolerated well No report of cardiac concerns or symptoms Strength training completed today  Goals Unmet:  Not Applicable  Comments: Service time is from 10:30a to 12:10p    Dr. Wesam G. Yacoub is Medical Director for Pulmonary Rehab at [facility]. 

## 2017-04-18 NOTE — Progress Notes (Signed)
Patrick Knapp 83 y.o. male  DOB: 17-Jun-1928 MRN: [id number]           Nutrition Brief Note 1. Pulmonary fibrosis (HCC)    Past Medical History:  Diagnosis Date  . Arthritis    "all the joints" (02/06/2016)  . Coronary artery disease    a. PCI with DES to mid LAD 7/17 b.post PTCA and stenting of his left circumflex artery in 12/08  . Enlarged prostate   . GERD (gastroesophageal reflux disease)   . Hyperlipidemia   . Hypertension   . Ischemia   . Shortness of breath   . Skin disorder    Occurs at times as rash, whelps, blister-like areas  with itching sometimes-has occurred for years , but can not find an exact dx or cause   Meds reviewed.   Ht: Ht Readings from Last 1 Encounters:  04/05/17 5' 5.5" (1.664 m)     Wt:  Wt Readings from Last 3 Encounters:  04/18/17 190 lb 0.6 oz (86.2 kg)  04/16/17 192 lb 3.9 oz (87.2 kg)  04/05/17 194 lb 7.1 oz (88.2 kg)    BMI: 31.1    Current tobacco use? No  Nutrition Diagnosis ? Food-and nutrition-related knowledge deficit related to lack of exposure to information as related to diagnosis of pulmonary disease ? Obesity related to excessive energy intake as evidenced by a BMI of 31.1  Goal(s) 1. Identify food quantities necessary to achieve wt loss of  -2# per week to a goal wt loss of 2.7-10.9 kg (6-24 lb) at graduation from pulmonary rehab.  Plan:  Pt to attend Pulmonary Nutrition class Will provide client-centered nutrition education as part of interdisciplinary care.   Monitor and evaluate progress toward nutrition goal with team.  Monitor and Evaluate progress toward nutrition goal with team.   Mickle Plumb, M.Ed, RD, LDN, CDE 04/18/2017 2:17 PM

## 2017-04-19 ENCOUNTER — Ambulatory Visit (INDEPENDENT_AMBULATORY_CARE_PROVIDER_SITE_OTHER): Payer: Medicare HMO | Admitting: Pulmonary Disease

## 2017-04-19 ENCOUNTER — Encounter: Payer: Self-pay | Admitting: Pulmonary Disease

## 2017-04-19 DIAGNOSIS — K219 Gastro-esophageal reflux disease without esophagitis: Secondary | ICD-10-CM | POA: Diagnosis not present

## 2017-04-19 DIAGNOSIS — J84112 Idiopathic pulmonary fibrosis: Secondary | ICD-10-CM

## 2017-04-19 NOTE — Patient Instructions (Signed)
Schedule PFTs- spirometry plus DLCO only  Call us if he develops worsening cough, shortness of breath or swelling in your legs  We discussed treatment options for idiopathic pulmonary fibrosis

## 2017-04-19 NOTE — Progress Notes (Signed)
   Subjective:    Patient ID: Patrick Knapp, male    DOB: 26-Nov-1927, 83 y.o.   MRN: [id number]  HPI  83 yo male never smoker For follow-up of pulmonary fibrosis. First seen 12/2016.  He complained by his daughter today He has enrolled in pulmonary rehabilitation, had a 6 minute walk prior to starting, this is really  helping him.   He denies cough, wheezing, shortness of breath is at its baseline. He is several questions about his CT scan in PFTs and medications for fibrosis   Significant tests/ events reviewed  CT chest 2011 showed diffuse right greater than left peripheral and subpleural reticular fibrosis. Ambulatory O2 saturation on room air without desaturation 12/28/2016 ANA , CCP neg  HRCT fibrotic interstitial lung disease with mild honeycombing. Slight basilar predominance and slight asymmetrical involvement of the right lung with some progression since 2011.  PFTs 01/2017 no obs, FEV1 96%, FVC 76%   Past Medical History:  Diagnosis Date  . Arthritis    "all the joints" (02/06/2016)  . Coronary artery disease    a. PCI with DES to mid LAD 7/17 b.post PTCA and stenting of his left circumflex artery in 12/08  . Enlarged prostate   . GERD (gastroesophageal reflux disease)   . Hyperlipidemia   . Hypertension   . Ischemia   . Shortness of breath   . Skin disorder    Occurs at times as rash, whelps, blister-like areas  with itching sometimes-has occurred for years , but can not find an exact dx or cause    Review of Systems neg for any significant sore throat, dysphagia, itching, sneezing, nasal congestion or excess/ purulent secretions, fever, chills, sweats, unintended wt loss, pleuritic or exertional cp, hempoptysis, orthopnea pnd or change in chronic leg swelling.   Also denies presyncope, palpitations, heartburn, abdominal pain, nausea, vomiting, diarrhea or change in bowel or urinary habits, dysuria,hematuria, rash, arthralgias, visual complaints, headache,  numbness weakness or ataxia.     Objective:   Physical Exam  Gen. Pleasant, well-nourished, in no distress ENT - no thrush, no post nasal drip Neck: No JVD, no thyromegaly, no carotid bruits Lungs: no use of accessory muscles, no dullness to percussion,bibasal rales 1/3, no rhonchi  Cardiovascular: Rhythm regular, heart sounds  normal, no murmurs or gallops, no peripheral edema Musculoskeletal: No deformities, no cyanosis or clubbing        Assessment & Plan:

## 2017-04-19 NOTE — Assessment & Plan Note (Signed)
Continue Protonix emphasized control of reflux

## 2017-04-19 NOTE — Assessment & Plan Note (Addendum)
Schedule PFTs- spirometry plus DLCO only to establish baseline since DLCO was not done in the last due to poor technique  Call us if he develops worsening cough, shortness of breath or swelling in your legs  We discussed treatment options for idiopathic pulmonary fibrosis. We discussed what describes a progressive phenotype He would like to hold off on medications at this time -shared decision making.  Greater than 50% time was spent in counseling and coordination of care with the patient

## 2017-04-23 ENCOUNTER — Encounter (HOSPITAL_COMMUNITY)
Admission: RE | Admit: 2017-04-23 | Discharge: 2017-04-23 | Disposition: A | Discharge status: Home / Self Care | Payer: Non-veteran care | Source: Ambulatory Visit | Attending: Pulmonary Disease | Admitting: Pulmonary Disease

## 2017-04-23 VITALS — Wt 195.5 lb

## 2017-04-23 DIAGNOSIS — J84112 Idiopathic pulmonary fibrosis: Secondary | ICD-10-CM | POA: Diagnosis not present

## 2017-04-23 DIAGNOSIS — J8410 Pulmonary fibrosis, unspecified: Secondary | ICD-10-CM

## 2017-04-23 NOTE — Progress Notes (Signed)
Daily Session Note  Patient Details  Name: Patrick Knapp MRN: [id number] Date of Birth: 02/10/1928 Referring Provider:     Pulmonary Rehab Walk Test from 04/09/2017 in [place]  Referring Provider  Dr. Nelda Marseille      Encounter Date: 04/23/2017  Check In:     Session Check In - 04/23/17 1024      Check-In   Location [facility]   Staff Present Su Hilt, MS, ACSM RCEP, Exercise Physiologist;Lisa Ysidro Evert, RN;Portia Rollene Rotunda, RN, BSN   Supervising physician immediately available to respond to emergencies [facility] immediately available   Physician(s) Dr. Verlon Au   Medication changes reported     No   Fall or balance concerns reported    No   Tobacco Cessation No Change   Warm-up and Cool-down Performed as group-led instruction   Resistance Training Performed Yes   VAD Patient? No     Pain Assessment   Currently in Pain? No/denies   Multiple Pain Sites No      Capillary Blood Glucose: No results found for this or any previous visit (from the past 24 hour(s)).      Exercise Prescription Changes - 04/23/17 1200      Response to Exercise   Blood Pressure (Admit) 100/52   Blood Pressure (Exercise) 152/64   Blood Pressure (Exit) 104/62   Heart Rate (Admit) 52 bpm   Heart Rate (Exercise) 71 bpm   Heart Rate (Exit) 53 bpm   Oxygen Saturation (Admit) 96 %   Oxygen Saturation (Exercise) 91 %   Oxygen Saturation (Exit) 97 %   Rating of Perceived Exertion (Exercise) 12   Perceived Dyspnea (Exercise) 2   Duration Continue with 45 min of aerobic exercise without signs/symptoms of physical distress.   Intensity THRR unchanged     Progression   Progression Continue to progress workloads to maintain intensity without signs/symptoms of physical distress.     Resistance Training   Training Prescription Yes   Weight orange bands   Reps 10-15     Bike   Level 0.8   Minutes 17     NuStep   Level 3   Minutes  17   METs 2.4     Track   Laps 12   Minutes 17      History  Smoking Status  . Never Smoker  Smokeless Tobacco  . Never Used    Goals Met:  Exercise tolerated well No report of cardiac concerns or symptoms Strength training completed today  Goals Unmet:  Not Applicable  Comments: Service time is from 10:30a to 12:10p    Dr. Rush Farmer is Medical Director for Pulmonary Rehab at [facility].

## 2017-04-25 ENCOUNTER — Encounter (HOSPITAL_COMMUNITY)
Admission: RE | Admit: 2017-04-25 | Discharge: 2017-04-25 | Disposition: A | Discharge status: Home / Self Care | Payer: Non-veteran care | Source: Ambulatory Visit | Attending: Pulmonary Disease | Admitting: Pulmonary Disease

## 2017-04-25 VITALS — Wt 194.7 lb

## 2017-04-25 DIAGNOSIS — J8410 Pulmonary fibrosis, unspecified: Secondary | ICD-10-CM

## 2017-04-25 DIAGNOSIS — J84112 Idiopathic pulmonary fibrosis: Secondary | ICD-10-CM | POA: Diagnosis not present

## 2017-04-25 NOTE — Progress Notes (Signed)
Daily Session Note  Patient Details  Name: Patrick Knapp MRN: [id number] Date of Birth: 1928-03-03 Referring Provider:     Pulmonary Rehab Walk Test from 04/09/2017 in [place]  Referring Provider  Dr. Nelda Marseille      Encounter Date: 04/25/2017  Check In:     Session Check In - 04/25/17 1018      Check-In   Location [facility]   Staff Present Su Hilt, MS, ACSM RCEP, Exercise Physiologist;Lisa Ysidro Evert, RN;Portia Rollene Rotunda, RN, BSN   Supervising physician immediately available to respond to emergencies [facility] immediately available   Physician(s) Dr. Horris Latino   Medication changes reported     No   Fall or balance concerns reported    No   Tobacco Cessation No Change   Warm-up and Cool-down Performed as group-led instruction   Resistance Training Performed Yes   VAD Patient? No     Pain Assessment   Currently in Pain? No/denies   Multiple Pain Sites No      Capillary Blood Glucose: No results found for this or any previous visit (from the past 24 hour(s)).      Exercise Prescription Changes - 04/25/17 1200      Response to Exercise   Blood Pressure (Admit) 124/52   Blood Pressure (Exercise) 156/60   Blood Pressure (Exit) 118/70   Heart Rate (Admit) 56 bpm   Heart Rate (Exercise) 76 bpm   Heart Rate (Exit) 58 bpm   Oxygen Saturation (Admit) 95 %   Oxygen Saturation (Exercise) 92 %   Oxygen Saturation (Exit) 97 %   Rating of Perceived Exertion (Exercise) 13   Perceived Dyspnea (Exercise) 3   Duration Continue with 45 min of aerobic exercise without signs/symptoms of physical distress.   Intensity THRR unchanged     Progression   Progression Continue to progress workloads to maintain intensity without signs/symptoms of physical distress.     Resistance Training   Training Prescription Yes   Weight orange bands   Reps 10-15     Bike   Level 0.8   Minutes 17     Track   Laps 16   Minutes  17      History  Smoking Status  . Never Smoker  Smokeless Tobacco  . Never Used    Goals Met:  Exercise tolerated well No report of cardiac concerns or symptoms Strength training completed today  Goals Unmet:  Not Applicable  Comments: Service time is from 10:30a to 12:35p    Dr. Rush Farmer is Medical Director for Pulmonary Rehab at [facility].

## 2017-04-26 ENCOUNTER — Ambulatory Visit (INDEPENDENT_AMBULATORY_CARE_PROVIDER_SITE_OTHER): Payer: Medicare HMO | Admitting: Pulmonary Disease

## 2017-04-26 DIAGNOSIS — J84112 Idiopathic pulmonary fibrosis: Secondary | ICD-10-CM | POA: Diagnosis not present

## 2017-04-26 LAB — PULMONARY FUNCTION TEST
DL/VA % pred: 89 %
DL/VA: 3.93 ml/min/mmHg/L
DLCO COR % PRED: 56 %
DLCO cor: 16.32 ml/min/mmHg
DLCO unc % pred: 57 %
DLCO unc: 16.54 ml/min/mmHg
FEF 25-75 Pre: 2.14 L/sec
FEF2575-%Pred-Pre: 153 %
FEV1-%Pred-Pre: 95 %
FEV1-PRE: 2.18 L
FEV1FVC-%Pred-Pre: 124 %
FEV6-%Pred-Pre: 82 %
FEV6-Pre: 2.51 L
FEV6FVC-%Pred-Pre: 108 %
FVC-%PRED-PRE: 75 %
FVC-PRE: 2.51 L
PRE FEV1/FVC RATIO: 87 %
Pre FEV6/FVC Ratio: 100 %

## 2017-04-26 NOTE — Progress Notes (Signed)
PFT completed today 04/26/17.  

## 2017-04-29 NOTE — Progress Notes (Signed)
Pulmonary Individual Treatment Plan  Patient Details  Name: Patrick Knapp MRN: [id number] Date of Birth: 18-Jun-1928 Referring Provider:     Pulmonary Rehab Walk Test from 04/09/2017 in [facility]ARD[facility]  Referring Provider  Dr. Molli Knock      Initial Encounter Date:    Pulmonary Rehab Walk Test from 04/09/2017 in [facility] CARDIAC REHAB  Date  04/09/17  Referring Provider  Dr. Molli Knock      Visit Diagnosis: Pulmonary fibrosis (HCC)  Patient's Home Medications on Admission:   Current Outpatient Prescriptions:  .  acetaminophen (TYLENOL) 325 MG tablet, Take 2 tablets (650 mg total) by mouth every 6 (six) hours as needed for mild pain (or Fever >/= 101)., Disp: 40 tablet, Rfl: 0 .  ALPRAZolam (XANAX) 0.5 MG tablet, Take 0.5 mg by mouth 3 (three) times daily as needed. AS NEEDED FOR ANXIETY, Disp: , Rfl:  .  amLODipine (NORVASC) 2.5 MG tablet, Take 1 tablet (2.5 mg total) by mouth daily., Disp: 90 tablet, Rfl: 3 .  atorvastatin (LIPITOR) 20 MG tablet, Take 20 mg by mouth daily. , Disp: , Rfl:  .  cholecalciferol (VITAMIN D) 1000 units tablet, Take 1,000 Units by mouth daily., Disp: , Rfl:  .  clopidogrel (PLAVIX) 75 MG tablet, Take 1 tablet (75 mg total) by mouth daily., Disp: 30 tablet, Rfl: 11 .  doxazosin (CARDURA) 4 MG tablet, Take 2 mg by mouth every morning. , Disp: , Rfl:  .  isosorbide mononitrate (IMDUR) 30 MG 24 hr tablet, Take 1 tablet (30 mg total) by mouth daily., Disp: 90 tablet, Rfl: 2 .  magnesium gluconate (MAGONATE) 500 MG tablet, Take 500 mg by mouth daily., Disp: , Rfl:  .  metoprolol tartrate (LOPRESSOR) 50 MG tablet, Take 0.5 tablets (25 mg total) by mouth 2 (two) times daily., Disp: 90 tablet, Rfl: 1 .  nitroGLYCERIN (NITROSTAT) 0.4 MG SL tablet, Place 1 tablet (0.4 mg total) under the tongue every 5 (five) minutes as needed for chest pain., Disp: 25 tablet, Rfl: 3 .  pantoprazole (PROTONIX) 40 MG tablet, Take 40 mg by  mouth at bedtime. DAILY AT HS, Disp: , Rfl:  .  psyllium (REGULOID) 0.52 g capsule, Take 0.52 g by mouth daily., Disp: , Rfl:   Past Medical History: Past Medical History:  Diagnosis Date  . Arthritis    "all the joints" (02/06/2016)  . Coronary artery disease    a. PCI with DES to mid LAD 7/17 b.post PTCA and stenting of his left circumflex artery in 12/08  . Enlarged prostate   . GERD (gastroesophageal reflux disease)   . Hyperlipidemia   . Hypertension   . Ischemia   . Shortness of breath   . Skin disorder    Occurs at times as rash, whelps, blister-like areas  with itching sometimes-has occurred for years , but can not find an exact dx or cause    Tobacco Use: History  Smoking Status  . Never Smoker  Smokeless Tobacco  . Never Used    Labs: Recent Review Flowsheet Data    Labs for ITP Cardiac and Pulmonary Rehab Latest Ref Rng & Units 06/14/2007 01/05/2011 03/17/2014 02/06/2016 02/06/2016   Cholestrol 0 - 200 mg/dL - 782 956 - -   LDLCALC 0 - 99 mg/dL - 53 49 - -   HDL >21.30 mg/dL - 86.57 84.69(G) - -   Trlycerides 0.0 - 149.0 mg/dL - 295.2 841.3 - -   PHART 7.350 -  7.450 - - - - 7.336(L)   PCO2ART 35.0 - 45.0 mmHg - - - - 44.4   HCO3 20.0 - 24.0 mEq/L 23.3 - - 23.2 23.8   TCO2 0 - 100 mmol/L 24 - - 25 25   ACIDBASEDEF 0.0 - 2.0 mmol/L 2.0 - - 3.0(H) 2.0   O2SAT % - - - 72.0 97.0      Capillary Blood Glucose: No results found for: GLUCAP   Pulmonary Assessment Scores:     Pulmonary Assessment Scores    Row Name 04/10/17 1130 04/11/17 0704       ADL UCSD   ADL Phase Entry Entry    SOB Score total 37  -      CAT Score   CAT Score 17  Entry  -      mMRC Score   mMRC Score  - 1       Pulmonary Function Assessment:     Pulmonary Function Assessment - 04/05/17 1010      Breath   Bilateral Breath Sounds Other   Other course crackles in bases bilaterally   Shortness of Breath Yes;Limiting activity      Exercise Target Goals:    Exercise Program  Goal: Individual exercise prescription set with THRR, safety & activity barriers. Participant demonstrates ability to understand and report RPE using BORG scale, to self-measure pulse accurately, and to acknowledge the importance of the exercise prescription.  Exercise Prescription Goal: Starting with aerobic activity 30 plus minutes a day, 3 days per week for initial exercise prescription. Provide home exercise prescription and guidelines that participant acknowledges understanding prior to discharge.  Activity Barriers & Risk Stratification:     Activity Barriers & Cardiac Risk Stratification - 04/05/17 0956      Activity Barriers & Cardiac Risk Stratification   Activity Barriers Shortness of Breath;Arthritis      6 Minute Walk:     6 Minute Walk    Row Name 04/11/17 0700         6 Minute Walk   Phase Initial     Distance 1540 feet     Walk Time 6 minutes     # of Rest Breaks 0     MPH 2.91     METS 3.22     RPE 13     Perceived Dyspnea  3     Symptoms No     Resting HR 72 bpm     Resting BP 165/73     Resting Oxygen Saturation  95 %     Exercise Oxygen Saturation  during 6 min walk 90 %     Max Ex. HR 121 bpm     Max Ex. BP 220/78     2 Minute Post BP 175/77       Interval HR   1 Minute HR 97     2 Minute HR 100     3 Minute HR 107     4 Minute HR 121     5 Minute HR 114     6 Minute HR 119     2 Minute Post HR 119     Interval Heart Rate? Yes       Interval Oxygen   Interval Oxygen? Yes     Baseline Oxygen Saturation % 95 %     1 Minute Oxygen Saturation % 94 %     1 Minute Liters of Oxygen 0 L     2 Minute Oxygen Saturation % 91 %  2 Minute Liters of Oxygen 0 L     3 Minute Oxygen Saturation % 91 %     3 Minute Liters of Oxygen 0 L     4 Minute Oxygen Saturation % 90 %     4 Minute Liters of Oxygen 0 L     5 Minute Oxygen Saturation % 91 %     5 Minute Liters of Oxygen 0 L     6 Minute Oxygen Saturation % 90 %     6 Minute Liters of Oxygen 0 L      2 Minute Post Oxygen Saturation % 91 %     2 Minute Post Liters of Oxygen 0 L        Oxygen Initial Assessment:     Oxygen Initial Assessment - 04/11/17 0704      Initial 6 min Walk   Oxygen Used None     Program Oxygen Prescription   Program Oxygen Prescription None      Oxygen Re-Evaluation:     Oxygen Re-Evaluation    Row Name 04/26/17 1152             Program Oxygen Prescription   Program Oxygen Prescription None         Home Oxygen   Home Oxygen Device None       Sleep Oxygen Prescription None       Home Exercise Oxygen Prescription None       Home at Rest Exercise Oxygen Prescription None          Oxygen Discharge (Final Oxygen Re-Evaluation):     Oxygen Re-Evaluation - 04/26/17 1152      Program Oxygen Prescription   Program Oxygen Prescription None     Home Oxygen   Home Oxygen Device None   Sleep Oxygen Prescription None   Home Exercise Oxygen Prescription None   Home at Rest Exercise Oxygen Prescription None      Initial Exercise Prescription:     Initial Exercise Prescription - 04/11/17 0700      Date of Initial Exercise RX and Referring Provider   Date 04/09/17   Referring Provider Dr. Molli KnockYacoub     Bike   Level 0.4   Minutes 17     NuStep   Level 2   Minutes 17   METs 1.5     Track   Laps 10   Minutes 17     Prescription Details   Frequency (times per week) 2     Intensity   THRR 40-80% of Max Heartrate 54-107   Ratings of Perceived Exertion 11-13   Perceived Dyspnea 0-4     Progression   Progression Continue progressive overload as per policy without signs/symptoms or physical distress.     Resistance Training   Training Prescription Yes   Weight orange bands   Reps 10-15      Perform Capillary Blood Glucose checks as needed.  Exercise Prescription Changes:     Exercise Prescription Changes    Row Name 04/16/17 1300 04/18/17 1200 04/23/17 1200 04/25/17 1200       Response to Exercise   Blood  Pressure (Admit) 120/62 118/56 100/52 124/52    Blood Pressure (Exercise) 170/64 104/50 152/64 156/60    Blood Pressure (Exit) 120/60 100/50 104/62 118/70    Heart Rate (Admit) 63 bpm 47 bpm 52 bpm 56 bpm    Heart Rate (Exercise) 83 bpm 71 bpm 71 bpm 76 bpm    Heart Rate (Exit) 71  bpm 53 bpm 53 bpm 58 bpm    Oxygen Saturation (Admit) 97 % 98 % 96 % 95 %    Oxygen Saturation (Exercise) 95 % 91 % 91 % 92 %    Oxygen Saturation (Exit) 97 % 97 % 97 % 97 %    Rating of Perceived Exertion (Exercise) 13 15 12 13     Perceived Dyspnea (Exercise) 2 1 2 3     Duration Continue with 45 min of aerobic exercise without signs/symptoms of physical distress. Continue with 45 min of aerobic exercise without signs/symptoms of physical distress. Continue with 45 min of aerobic exercise without signs/symptoms of physical distress. Continue with 45 min of aerobic exercise without signs/symptoms of physical distress.    Intensity THRR unchanged THRR unchanged THRR unchanged THRR unchanged      Progression   Progression Continue to progress workloads to maintain intensity without signs/symptoms of physical distress. Continue to progress workloads to maintain intensity without signs/symptoms of physical distress. Continue to progress workloads to maintain intensity without signs/symptoms of physical distress. Continue to progress workloads to maintain intensity without signs/symptoms of physical distress.      Resistance Training   Training Prescription Yes Yes Yes Yes    Weight orange bands orange bands orange bands orange bands    Reps 10-15 10-15 10-15 10-15      Bike   Level 0.6 0.6 0.8 0.8    Minutes 17 17 17 17       NuStep   Level  - 2 3  -    Minutes  - 17 17  -    METs  - 2.1 2.4  -      Track   Laps 12 14 12 16     Minutes 17 17 17 17        Exercise Comments:   Exercise Goals and Review:     Exercise Goals    Row Name 04/05/17 0958             Exercise Goals   Increase Physical  Activity Yes       Intervention Provide advice, education, support and counseling about physical activity/exercise needs.;Develop an individualized exercise prescription for aerobic and resistive training based on initial evaluation findings, risk stratification, comorbidities and participant's personal goals.       Expected Outcomes Achievement of increased cardiorespiratory fitness and enhanced flexibility, muscular endurance and strength shown through measurements of functional capacity and personal statement of participant.       Increase Strength and Stamina Yes       Intervention Provide advice, education, support and counseling about physical activity/exercise needs.;Develop an individualized exercise prescription for aerobic and resistive training based on initial evaluation findings, risk stratification, comorbidities and participant's personal goals.       Expected Outcomes Achievement of increased cardiorespiratory fitness and enhanced flexibility, muscular endurance and strength shown through measurements of functional capacity and personal statement of participant.       Able to understand and use rate of perceived exertion (RPE) scale Yes       Intervention Provide education and explanation on how to use RPE scale       Expected Outcomes Short Term: Able to use RPE daily in rehab to express subjective intensity level;Long Term:  Able to use RPE to guide intensity level when exercising independently       Able to understand and use Dyspnea scale Yes       Intervention Provide education and explanation on how to use  Dyspnea scale       Expected Outcomes Short Term: Able to use Dyspnea scale daily in rehab to express subjective sense of shortness of breath during exertion;Long Term: Able to use Dyspnea scale to guide intensity level when exercising independently       Knowledge and understanding of Target Heart Rate Range (THRR) Yes       Intervention Provide education and explanation of THRR  including how the numbers were predicted and where they are located for reference       Expected Outcomes Short Term: Able to state/look up THRR;Long Term: Able to use THRR to govern intensity when exercising independently;Short Term: Able to use daily as guideline for intensity in rehab       Understanding of Exercise Prescription Yes       Intervention Provide education, explanation, and written materials on patient's individual exercise prescription       Expected Outcomes Short Term: Able to explain program exercise prescription;Long Term: Able to explain home exercise prescription to exercise independently          Exercise Goals Re-Evaluation :     Exercise Goals Re-Evaluation    Row Name 04/29/17 1010             Exercise Goal Re-Evaluation   Exercise Goals Review Increase Strength and Stamina;Able to understand and use Dyspnea scale;Able to understand and use rate of perceived exertion (RPE) scale;Knowledge and understanding of Target Heart Rate Range (THRR);Understanding of Exercise Prescription;Increase Physical Activity       Comments Patient has only attended 4 sessions. Will cont. to monitor and progress as able.        Expected Outcomes Through exercise at rehab and at home, patient will increase strength and stamina and decrease shortness of breath.          Discharge Exercise Prescription (Final Exercise Prescription Changes):     Exercise Prescription Changes - 04/25/17 1200      Response to Exercise   Blood Pressure (Admit) 124/52   Blood Pressure (Exercise) 156/60   Blood Pressure (Exit) 118/70   Heart Rate (Admit) 56 bpm   Heart Rate (Exercise) 76 bpm   Heart Rate (Exit) 58 bpm   Oxygen Saturation (Admit) 95 %   Oxygen Saturation (Exercise) 92 %   Oxygen Saturation (Exit) 97 %   Rating of Perceived Exertion (Exercise) 13   Perceived Dyspnea (Exercise) 3   Duration Continue with 45 min of aerobic exercise without signs/symptoms of physical distress.    Intensity THRR unchanged     Progression   Progression Continue to progress workloads to maintain intensity without signs/symptoms of physical distress.     Resistance Training   Training Prescription Yes   Weight orange bands   Reps 10-15     Bike   Level 0.8   Minutes 17     Track   Laps 16   Minutes 17      Nutrition:  Target Goals: Understanding of nutrition guidelines, daily intake of sodium 1500mg , cholesterol 200mg , calories 30% from fat and 7% or less from saturated fats, daily to have 5 or more servings of fruits and vegetables.  Biometrics:     Pre Biometrics - 04/05/17 1000      Pre Biometrics   Grip Strength 26 kg       Nutrition Therapy Plan and Nutrition Goals:     Nutrition Therapy & Goals - 04/18/17 1421      Nutrition Therapy   Diet  Therapeutic Lifestyle Changes     Personal Nutrition Goals   Nutrition Goal Identify food quantities necessary to achieve wt loss of  -2# per week to a goal wt loss of 2.7-10.9 kg (6-24 lb) at graduation from pulmonary rehab.     Intervention Plan   Intervention Prescribe, educate and counsel regarding individualized specific dietary modifications aiming towards targeted core components such as weight, hypertension, lipid management, diabetes, heart failure and other comorbidities.   Expected Outcomes Short Term Goal: Understand basic principles of dietary content, such as calories, fat, sodium, cholesterol and nutrients.;Long Term Goal: Adherence to prescribed nutrition plan.      Nutrition Discharge: Rate Your Plate Scores:   Nutrition Goals Re-Evaluation:   Nutrition Goals Discharge (Final Nutrition Goals Re-Evaluation):   Psychosocial: Target Goals: Acknowledge presence or absence of significant depression and/or stress, maximize coping skills, provide positive support system. Participant is able to verbalize types and ability to use techniques and skills needed for reducing stress and  depression.  Initial Review & Psychosocial Screening:     Initial Psych Review & Screening - 04/05/17 1011      Initial Review   Current issues with None Identified     Family Dynamics   Good Support System? Yes     Barriers   Psychosocial barriers to participate in program There are no identifiable barriers or psychosocial needs.     Screening Interventions   Interventions Encouraged to exercise      Quality of Life Scores:   PHQ-9: Recent Review Flowsheet Data    Depression screen [facility] 2/9 04/05/2017   Decreased Interest 0   Down, Depressed, Hopeless 0   PHQ - 2 Score 0     Interpretation of Total Score  Total Score Depression Severity:  1-4 = Minimal depression, 5-9 = Mild depression, 10-14 = Moderate depression, 15-19 = Moderately severe depression, 20-27 = Severe depression   Psychosocial Evaluation and Intervention:     Psychosocial Evaluation - 04/05/17 1014      Psychosocial Evaluation & Interventions   Interventions Encouraged to exercise with the program and follow exercise prescription   Expected Outcomes patient will remain free from psychosocial barriers to participation   Continue Psychosocial Services  No Follow up required      Psychosocial Re-Evaluation:     Psychosocial Re-Evaluation    Row Name 04/26/17 1159             Psychosocial Re-Evaluation   Current issues with None Identified       Expected Outcomes patient will remain free from psychosocial barriers to participation in pulmonary rehab       Continue Psychosocial Services  No Follow up required          Psychosocial Discharge (Final Psychosocial Re-Evaluation):     Psychosocial Re-Evaluation - 04/26/17 1159      Psychosocial Re-Evaluation   Current issues with None Identified   Expected Outcomes patient will remain free from psychosocial barriers to participation in pulmonary rehab   Continue Psychosocial Services  No Follow up required      Education: Education  Goals: Education classes will be provided on a weekly basis, covering required topics. Participant will state understanding/return demonstration of topics presented.  Learning Barriers/Preferences:     Learning Barriers/Preferences - 04/05/17 1009      Learning Barriers/Preferences   Learning Barriers None   Learning Preferences Written Material;Verbal Instruction;Group Instruction;Computer/Internet      Education Topics: Risk Factor Reduction:  -Group instruction that is supported  by a PowerPoint presentation. Instructor discusses the definition of a risk factor, different risk factors for pulmonary disease, and how the heart and lungs work together.     Nutrition for Pulmonary Patient:  -Group instruction provided by PowerPoint slides, verbal discussion, and written materials to support subject matter. The instructor gives an explanation and review of healthy diet recommendations, which includes a discussion on weight management, recommendations for fruit and vegetable consumption, as well as protein, fluid, caffeine, fiber, sodium, sugar, and alcohol. Tips for eating when patients are short of breath are discussed.   PULMONARY REHAB OTHER RESPIRATORY from 04/25/2017 in [facility] CARDIAC REHAB  Date  04/25/17  Educator  edna  Instruction Review Code  2- meets goals/outcomes      Pursed Lip Breathing:  -Group instruction that is supported by demonstration and informational handouts. Instructor discusses the benefits of pursed lip and diaphragmatic breathing and detailed demonstration on how to preform both.     Oxygen Safety:  -Group instruction provided by PowerPoint, verbal discussion, and written material to support subject matter. There is an overview of "What is Oxygen" and "Why do we need it".  Instructor also reviews how to create a safe environment for oxygen use, the importance of using oxygen as prescribed, and the risks of noncompliance. There is a brief  discussion on traveling with oxygen and resources the patient may utilize.   Oxygen Equipment:  -Group instruction provided by [facility] Staff utilizing handouts, written materials, and equipment demonstrations.   Signs and Symptoms:  -Group instruction provided by written material and verbal discussion to support subject matter. Warning signs and symptoms of infection, stroke, and heart attack are reviewed and when to call the physician/911 reinforced. Tips for preventing the spread of infection discussed.   Advanced Directives:  -Group instruction provided by verbal instruction and written material to support subject matter. Instructor reviews Advanced Directive laws and proper instruction for filling out document.   Pulmonary Video:  -Group video education that reviews the importance of medication and oxygen compliance, exercise, good nutrition, pulmonary hygiene, and pursed lip and diaphragmatic breathing for the pulmonary patient.   Exercise for the Pulmonary Patient:  -Group instruction that is supported by a PowerPoint presentation. Instructor discusses benefits of exercise, core components of exercise, frequency, duration, and intensity of an exercise routine, importance of utilizing pulse oximetry during exercise, safety while exercising, and options of places to exercise outside of rehab.     Pulmonary Medications:  -Verbally interactive group education provided by instructor with focus on inhaled medications and proper administration.   Anatomy and Physiology of the Respiratory System and Intimacy:  -Group instruction provided by PowerPoint, verbal discussion, and written material to support subject matter. Instructor reviews respiratory cycle and anatomical components of the respiratory system and their functions. Instructor also reviews differences in obstructive and restrictive respiratory diseases with examples of each. Intimacy, Sex, and Sexuality differences are  reviewed with a discussion on how relationships can change when diagnosed with pulmonary disease. Common sexual concerns are reviewed.   MD DAY -A group question and answer session with a medical doctor that allows participants to ask questions that relate to their pulmonary disease state.   PULMONARY REHAB OTHER RESPIRATORY from 04/25/2017 in [facility] CARDIAC REHAB  Date  04/16/17  Educator  Dr. Molli Knock  Instruction Review Code  2- meets goals/outcomes      OTHER EDUCATION -Group or individual verbal, written, or video instructions that support the educational  goals of the pulmonary rehab program.   Knowledge Questionnaire Score:     Knowledge Questionnaire Score - 04/10/17 1130      Knowledge Questionnaire Score   Pre Score 11/13      Core Components/Risk Factors/Patient Goals at Admission:     Personal Goals and Risk Factors at Admission - 04/05/17 1011      Core Components/Risk Factors/Patient Goals on Admission   Improve shortness of breath with ADL's Yes   Intervention Provide education, individualized exercise plan and daily activity instruction to help decrease symptoms of SOB with activities of daily living.   Expected Outcomes Short Term: Achieves a reduction of symptoms when performing activities of daily living.   Develop more efficient breathing techniques such as purse lipped breathing and diaphragmatic breathing; and practicing self-pacing with activity Yes   Intervention Provide education, demonstration and support about specific breathing techniuqes utilized for more efficient breathing. Include techniques such as pursed lipped breathing, diaphragmatic breathing and self-pacing activity.   Expected Outcomes Short Term: Participant will be able to demonstrate and use breathing techniques as needed throughout daily activities.   Increase knowledge of respiratory medications and ability to use respiratory devices properly  Yes   Intervention  Provide education and demonstration as needed of appropriate use of medications, inhalers, and oxygen therapy.   Expected Outcomes Short Term: Achieves understanding of medications use. Understands that oxygen is a medication prescribed by physician. Demonstrates appropriate use of inhaler and oxygen therapy.      Core Components/Risk Factors/Patient Goals Review:      Goals and Risk Factor Review    Row Name 04/26/17 1154             Core Components/Risk Factors/Patient Goals Review   Personal Goals Review Develop more efficient breathing techniques such as purse lipped breathing and diaphragmatic breathing and practicing self-pacing with activity.;Improve shortness of breath with ADL's;Increase knowledge of respiratory medications and ability to use respiratory devices properly.       Review patient has only attended 4 sessions since admission and it is too early to evaluate progress towards goals       Expected Outcomes see admission expected outcomes.          Core Components/Risk Factors/Patient Goals at Discharge (Final Review):      Goals and Risk Factor Review - 04/26/17 1154      Core Components/Risk Factors/Patient Goals Review   Personal Goals Review Develop more efficient breathing techniques such as purse lipped breathing and diaphragmatic breathing and practicing self-pacing with activity.;Improve shortness of breath with ADL's;Increase knowledge of respiratory medications and ability to use respiratory devices properly.   Review patient has only attended 4 sessions since admission and it is too early to evaluate progress towards goals   Expected Outcomes see admission expected outcomes.      ITP Comments:   Comments: ITP REVIEW Pt is making expected progress toward pulmonary rehab goals after completing 4 sessions. Recommend continued exercise, life style modification, education, and utilization of breathing techniques to increase stamina and strength and decrease  shortness of breath with exertion.

## 2017-04-30 ENCOUNTER — Encounter (HOSPITAL_COMMUNITY)
Admission: RE | Admit: 2017-04-30 | Discharge: 2017-04-30 | Disposition: A | Discharge status: Home / Self Care | Payer: Non-veteran care | Source: Ambulatory Visit | Attending: Pulmonary Disease | Admitting: Pulmonary Disease

## 2017-04-30 DIAGNOSIS — J84112 Idiopathic pulmonary fibrosis: Secondary | ICD-10-CM | POA: Diagnosis not present

## 2017-04-30 DIAGNOSIS — J8410 Pulmonary fibrosis, unspecified: Secondary | ICD-10-CM

## 2017-04-30 NOTE — Progress Notes (Signed)
Daily Session Note  Patient Details  Name: Patrick Knapp MRN: [id number] Date of Birth: 12/12/1927 Referring Provider:     Pulmonary Rehab Walk Test from 04/09/2017 in [place]  Referring Provider  Dr. Nelda Marseille      Encounter Date: 04/30/2017  Check In:     Session Check In - 04/30/17 1031      Check-In   Location [facility]   Staff Present Rosebud Poles, RN, BSN;Molly diVincenzo, MS, ACSM RCEP, Exercise Physiologist;Lisa Ysidro Evert, RN;Portia Rollene Rotunda, RN, BSN   Supervising physician immediately available to respond to emergencies [facility] immediately available   Physician(s) Dr. Horris Latino   Medication changes reported     No   Fall or balance concerns reported    No   Tobacco Cessation No Change   Warm-up and Cool-down Performed as group-led instruction   Resistance Training Performed Yes   VAD Patient? No     Pain Assessment   Currently in Pain? No/denies   Multiple Pain Sites No      Capillary Blood Glucose: No results found for this or any previous visit (from the past 24 hour(s)).      Exercise Prescription Changes - 04/30/17 1200      Response to Exercise   Blood Pressure (Admit) 136/64   Blood Pressure (Exercise) 160/70   Blood Pressure (Exit) 126/66   Heart Rate (Admit) 59 bpm   Heart Rate (Exercise) 80 bpm   Heart Rate (Exit) 66 bpm   Oxygen Saturation (Admit) 96 %   Oxygen Saturation (Exercise) 93 %   Oxygen Saturation (Exit) 96 %   Rating of Perceived Exertion (Exercise) 13   Perceived Dyspnea (Exercise) 2   Duration Continue with 45 min of aerobic exercise without signs/symptoms of physical distress.   Intensity THRR unchanged     Progression   Progression Continue to progress workloads to maintain intensity without signs/symptoms of physical distress.     Resistance Training   Training Prescription Yes   Weight orange bands   Reps 10-15     Bike   Level 0.8   Minutes 17     NuStep   Level 3   Minutes 17   METs 2.7     Track   Laps 13   Minutes 17      History  Smoking Status  . Never Smoker  Smokeless Tobacco  . Never Used    Goals Met:  Proper associated with RPD/PD & O2 Sat Strength training completed today  Goals Unmet:  Not Applicable  Comments: Service time is from 1030 to 1200   Dr. Rush Farmer is Medical Director for Pulmonary Rehab at [facility].

## 2017-05-02 ENCOUNTER — Encounter (HOSPITAL_COMMUNITY)
Admission: RE | Admit: 2017-05-02 | Discharge: 2017-05-02 | Disposition: A | Discharge status: Home / Self Care | Payer: Non-veteran care | Source: Ambulatory Visit | Attending: Pulmonary Disease | Admitting: Pulmonary Disease

## 2017-05-02 VITALS — Wt 194.2 lb

## 2017-05-02 DIAGNOSIS — J8410 Pulmonary fibrosis, unspecified: Secondary | ICD-10-CM

## 2017-05-02 DIAGNOSIS — J84112 Idiopathic pulmonary fibrosis: Secondary | ICD-10-CM | POA: Diagnosis not present

## 2017-05-02 NOTE — Progress Notes (Signed)
Daily Session Note  Patient Details  Name: Patrick Knapp MRN: [id number] Date of Birth: 07-May-1928 Referring Provider:     Pulmonary Rehab Walk Test from 04/09/2017 in [place]  Referring Provider  Dr. Nelda Marseille      Encounter Date: 05/02/2017  Check In:     Session Check In - 05/02/17 1053      Check-In   Location [facility]   Staff Present Trish Fountain, RN, BSN;Paquita Printy Ysidro Evert, RN;Joan Vance, RN, BSN;Molly diVincenzo, MS, ACSM RCEP, Exercise Physiologist   Supervising physician immediately available to respond to emergencies [facility] immediately available   Physician(s) Dr. Wendee Beavers   Medication changes reported     No   Fall or balance concerns reported    No   Tobacco Cessation No Change   Warm-up and Cool-down Performed as group-led instruction   Resistance Training Performed Yes   VAD Patient? No     Pain Assessment   Currently in Pain? No/denies   Multiple Pain Sites No      Capillary Blood Glucose: No results found for this or any previous visit (from the past 24 hour(s)).      Exercise Prescription Changes - 05/02/17 1200      Response to Exercise   Blood Pressure (Admit) 120/60   Blood Pressure (Exercise) 158/64   Blood Pressure (Exit) 110/70   Heart Rate (Admit) 52 bpm   Heart Rate (Exercise) 90 bpm   Heart Rate (Exit) 69 bpm   Oxygen Saturation (Admit) 96 %   Oxygen Saturation (Exercise) 94 %   Oxygen Saturation (Exit) 98 %   Rating of Perceived Exertion (Exercise) 12   Perceived Dyspnea (Exercise) 1   Duration Continue with 45 min of aerobic exercise without signs/symptoms of physical distress.   Intensity THRR unchanged     Progression   Progression Continue to progress workloads to maintain intensity without signs/symptoms of physical distress.     Resistance Training   Training Prescription Yes   Weight orange bands   Reps 10-15   Time 10 Minutes     Bike   Level 1   Minutes  17     NuStep   Level 4   Minutes 17   METs 3.2      History  Smoking Status  . Never Smoker  Smokeless Tobacco  . Never Used    Goals Met:  Exercise tolerated well No report of cardiac concerns or symptoms Strength training completed today  Goals Unmet:  Not Applicable  Comments: Service time is from 1030 to 1230    Dr. Rush Farmer is Medical Director for Pulmonary Rehab at [facility].

## 2017-05-07 ENCOUNTER — Encounter (HOSPITAL_COMMUNITY)
Admission: RE | Admit: 2017-05-07 | Discharge: 2017-05-07 | Disposition: A | Discharge status: Home / Self Care | Payer: Non-veteran care | Source: Ambulatory Visit | Attending: Pulmonary Disease | Admitting: Pulmonary Disease

## 2017-05-07 VITALS — Wt 193.8 lb

## 2017-05-07 DIAGNOSIS — J8410 Pulmonary fibrosis, unspecified: Secondary | ICD-10-CM

## 2017-05-07 DIAGNOSIS — J84112 Idiopathic pulmonary fibrosis: Secondary | ICD-10-CM | POA: Diagnosis not present

## 2017-05-07 NOTE — Progress Notes (Signed)
Daily Session Note  Patient Details  Name: Patrick Knapp MRN: [id number] Date of Birth: 02-27-1928 Referring Provider:     Pulmonary Rehab Walk Test from 04/09/2017 in [place]  Referring Provider  Dr. Nelda Marseille      Encounter Date: 05/07/2017  Check In:     Session Check In - 05/07/17 1029      Check-In   [facility]   Staff Present Rodney Langton, RN;Portia Rollene Rotunda, RN, Maxcine Ham, RN, BSN;Kenai Fluegel, MS, ACSM RCEP, Exercise Physiologist   Supervising physician immediately available to respond to emergencies [facility] immediately available   Physician(s) Dr. Wendee Beavers   Medication changes reported     No   Fall or balance concerns reported    No   Tobacco Cessation No Change   Warm-up and Cool-down Performed as group-led instruction   Resistance Training Performed Yes   VAD Patient? No     Pain Assessment   Currently in Pain? No/denies   Multiple Pain Sites No      Capillary Blood Glucose: No results found for this or any previous visit (from the past 24 hour(s)).      Exercise Prescription Changes - 05/07/17 1200      Response to Exercise   Blood Pressure (Admit) 118/56   Blood Pressure (Exercise) 130/60   Blood Pressure (Exit) 104/68   Heart Rate (Admit) 59 bpm   Heart Rate (Exercise) 88 bpm   Heart Rate (Exit) 63 bpm   Oxygen Saturation (Admit) 96 %   Oxygen Saturation (Exercise) 92 %   Oxygen Saturation (Exit) 96 %   Rating of Perceived Exertion (Exercise) 13   Perceived Dyspnea (Exercise) 2   Duration Continue with 45 min of aerobic exercise without signs/symptoms of physical distress.   Intensity THRR unchanged     Progression   Progression Continue to progress workloads to maintain intensity without signs/symptoms of physical distress.     Resistance Training   Training Prescription Yes   Weight orange bands   Reps 10-15   Time 10 Minutes     Bike   Level 1   Minutes  17     NuStep   Level 4   Minutes 17   METs 3     Track   Laps 13   Minutes 17      History  Smoking Status  . Never Smoker  Smokeless Tobacco  . Never Used    Goals Met:  Exercise tolerated well No report of cardiac concerns or symptoms Strength training completed today  Goals Unmet:  Not Applicable  Comments: Service time is from 10:30A to 12:00P    Dr. Rush Farmer is Medical Director for Pulmonary Rehab at [facility].

## 2017-05-09 ENCOUNTER — Encounter (HOSPITAL_COMMUNITY)
Admission: RE | Admit: 2017-05-09 | Discharge: 2017-05-09 | Disposition: A | Discharge status: Home / Self Care | Payer: Non-veteran care | Source: Ambulatory Visit | Attending: Pulmonary Disease | Admitting: Pulmonary Disease

## 2017-05-09 VITALS — Wt 194.9 lb

## 2017-05-09 DIAGNOSIS — J84112 Idiopathic pulmonary fibrosis: Secondary | ICD-10-CM | POA: Insufficient documentation

## 2017-05-09 DIAGNOSIS — J8410 Pulmonary fibrosis, unspecified: Secondary | ICD-10-CM

## 2017-05-09 NOTE — Progress Notes (Signed)
Daily Session Note  Patient Details  Name: Patrick Knapp MRN: [id number] Date of Birth: Jul 19, 1927 Referring Provider:     Pulmonary Rehab Walk Test from 04/09/2017 in [place]  Referring Provider  Dr. Nelda Marseille      Encounter Date: 05/09/2017  Check In:     Session Check In - 05/09/17 1030      Check-In   Location [facility]   Staff Present Rodney Langton, RN;Makelle Marrone Rollene Rotunda, RN, Maxcine Ham, RN, BSN;Molly diVincenzo, MS, ACSM RCEP, Exercise Physiologist   Supervising physician immediately available to respond to emergencies [facility] immediately available   Physician(s) Dr. Wynetta Emery   Medication changes reported     No   Fall or balance concerns reported    No   Tobacco Cessation No Change   Warm-up and Cool-down Performed as group-led instruction   Resistance Training Performed Yes   VAD Patient? No     Pain Assessment   Currently in Pain? No/denies   Multiple Pain Sites No      Capillary Blood Glucose: No results found for this or any previous visit (from the past 24 hour(s)).      Exercise Prescription Changes - 05/09/17 1246      Response to Exercise   Blood Pressure (Admit) 104/50   Blood Pressure (Exercise) 160/58   Blood Pressure (Exit) 116/66   Heart Rate (Admit) 57 bpm   Heart Rate (Exercise) 81 bpm   Heart Rate (Exit) 57 bpm   Oxygen Saturation (Admit) 94 %   Oxygen Saturation (Exercise) 90 %   Oxygen Saturation (Exit) 96 %   Rating of Perceived Exertion (Exercise) 13   Perceived Dyspnea (Exercise) 2   Duration Continue with 45 min of aerobic exercise without signs/symptoms of physical distress.   Intensity THRR unchanged     Progression   Progression Continue to progress workloads to maintain intensity without signs/symptoms of physical distress.     Resistance Training   Training Prescription Yes   Weight orange bands   Reps 10-15   Time 10 Minutes     Bike   Level 1   Minutes  17     Track   Laps 15   Minutes 17      History  Smoking Status  . Never Smoker  Smokeless Tobacco  . Never Used    Goals Met:  Independence with exercise equipment Improved SOB with ADL's Changing diet to healthy choices, watching portion sizes Exercise tolerated well No report of cardiac concerns or symptoms Strength training completed today  Goals Unmet:  Not Applicable  Comments: Service time is from 1030 to 1230   Dr. Rush Farmer is Medical Director for Pulmonary Rehab at [facility].

## 2017-05-13 ENCOUNTER — Other Ambulatory Visit: Payer: Self-pay | Admitting: Cardiovascular Disease

## 2017-05-14 ENCOUNTER — Encounter (HOSPITAL_COMMUNITY)
Admission: RE | Admit: 2017-05-14 | Discharge: 2017-05-14 | Disposition: A | Discharge status: Home / Self Care | Payer: Non-veteran care | Source: Ambulatory Visit | Attending: Pulmonary Disease | Admitting: Pulmonary Disease

## 2017-05-14 VITALS — Wt 196.0 lb

## 2017-05-14 DIAGNOSIS — J84112 Idiopathic pulmonary fibrosis: Secondary | ICD-10-CM | POA: Diagnosis not present

## 2017-05-14 DIAGNOSIS — J8410 Pulmonary fibrosis, unspecified: Secondary | ICD-10-CM

## 2017-05-14 NOTE — Progress Notes (Signed)
Daily Session Note  Patient Details  Name: Patrick Knapp MRN: [id number] Date of Birth: April 05, 1928 Referring Provider:     Pulmonary Rehab Walk Test from 04/09/2017 in [place]  Referring Provider  Dr. Nelda Marseille      Encounter Date: 05/14/2017  Check In: Session Check In - 05/14/17 1024      Check-In   Location  [facility]    Staff Present  Su Hilt, MS, ACSM RCEP, Exercise Physiologist;Lisa Ysidro Evert, RN;Portia Eloy, RN, Maxcine Ham, RN, BSN    Supervising physician immediately available to respond to emergencies  [facility] immediately available    Physician(s)  Dr. Broadus John    Medication changes reported      No    Fall or balance concerns reported     No    Tobacco Cessation  No Change    Warm-up and Cool-down  Performed as group-led instruction    Resistance Training Performed  Yes    VAD Patient?  No      Pain Assessment   Currently in Pain?  No/denies    Multiple Pain Sites  No       Capillary Blood Glucose: No results found for this or any previous visit (from the past 24 hour(s)).  Exercise Prescription Changes - 05/14/17 1200      Response to Exercise   Blood Pressure (Admit)  106/60    Blood Pressure (Exercise)  150/80    Blood Pressure (Exit)  116/68    Heart Rate (Admit)  65 bpm    Heart Rate (Exercise)  82 bpm    Heart Rate (Exit)  60 bpm    Oxygen Saturation (Admit)  98 %    Oxygen Saturation (Exercise)  90 %    Oxygen Saturation (Exit)  97 %    Rating of Perceived Exertion (Exercise)  13    Perceived Dyspnea (Exercise)  2    Duration  Continue with 45 min of aerobic exercise without signs/symptoms of physical distress.    Intensity  THRR unchanged      Progression   Progression  Continue to progress workloads to maintain intensity without signs/symptoms of physical distress.      Resistance Training   Training Prescription  Yes    Weight  orange bands    Reps  10-15    Time  10 Minutes      Bike   Level  1.3    Minutes  17      NuStep   Level  5    Minutes  17    METs  3.9      Track   Laps  16    Minutes  17       Social History   Tobacco Use  Smoking Status Never Smoker  Smokeless Tobacco Never Used    Goals Met:  Exercise tolerated well No report of cardiac concerns or symptoms Strength training completed today  Goals Unmet:  Not Applicable  Comments: Service time is from 10:30a to 12:15p    Dr. Rush Farmer is Medical Director for Pulmonary Rehab at [facility].

## 2017-05-16 ENCOUNTER — Encounter (HOSPITAL_COMMUNITY)
Admission: RE | Admit: 2017-05-16 | Discharge: 2017-05-16 | Disposition: A | Discharge status: Home / Self Care | Payer: Non-veteran care | Source: Ambulatory Visit | Attending: Pulmonary Disease | Admitting: Pulmonary Disease

## 2017-05-16 DIAGNOSIS — J84112 Idiopathic pulmonary fibrosis: Secondary | ICD-10-CM | POA: Diagnosis not present

## 2017-05-16 NOTE — Progress Notes (Signed)
Daily Session Note  Patient Details  Name: Patrick Knapp MRN: [id number] Date of Birth: 02-21-1928 Referring Provider:     Pulmonary Rehab Walk Test from 04/09/2017 in [place]  Referring Provider  Dr. Nelda Marseille      Encounter Date: 05/16/2017  Check In:   Capillary Blood Glucose: No results found for this or any previous visit (from the past 24 hour(s)).  Exercise Prescription Changes - 05/16/17 1327      Response to Exercise   Blood Pressure (Admit)  118/54    Blood Pressure (Exercise)  154/70    Blood Pressure (Exit)  138/62    Heart Rate (Admit)  59 bpm    Heart Rate (Exercise)  118 bpm    Heart Rate (Exit)  80 bpm    Oxygen Saturation (Admit)  95 %    Oxygen Saturation (Exercise)  90 %    Oxygen Saturation (Exit)  93 %    Rating of Perceived Exertion (Exercise)  13    Perceived Dyspnea (Exercise)  2    Duration  Continue with 45 min of aerobic exercise without signs/symptoms of physical distress.    Intensity  THRR unchanged      Progression   Progression  Continue to progress workloads to maintain intensity without signs/symptoms of physical distress.      Resistance Training   Training Prescription  Yes    Weight  orange bands    Reps  10-15    Time  10 Minutes      NuStep   Level  5    Minutes  17    METs  3.5      Track   Laps  17    Minutes  17       Social History   Tobacco Use  Smoking Status Never Smoker  Smokeless Tobacco Never Used    Goals Met:  Independence with exercise equipment Improved SOB with ADL's Using PLB without cueing & demonstrates good technique Exercise tolerated well No report of cardiac concerns or symptoms Strength training completed today  Goals Unmet:  Not Applicable  Comments: Service time is from 1030 to 1230   Dr. Rush Farmer is Medical Director for Pulmonary Rehab at [facility].

## 2017-05-21 ENCOUNTER — Encounter (HOSPITAL_COMMUNITY)
Admission: RE | Admit: 2017-05-21 | Discharge: 2017-05-21 | Disposition: A | Discharge status: Home / Self Care | Payer: Non-veteran care | Source: Ambulatory Visit | Attending: Pulmonary Disease | Admitting: Pulmonary Disease

## 2017-05-21 DIAGNOSIS — J8410 Pulmonary fibrosis, unspecified: Secondary | ICD-10-CM

## 2017-05-23 ENCOUNTER — Encounter (HOSPITAL_COMMUNITY): Payer: Non-veteran care

## 2017-05-23 NOTE — Progress Notes (Signed)
Pulmonary Individual Treatment Plan  Patient Details  Name: Patrick Knapp MRN: [id number] Date of Birth: Dec 09, 1927 Referring Provider:     Pulmonary Rehab Walk Test from 04/09/2017 in [facility]  Referring Provider  Dr. Molli Knock      Initial Encounter Date:    Pulmonary Rehab Walk Test from 04/09/2017 in [facility] CARDIAC REHAB  Date  04/09/17  Referring Provider  Dr. Molli Knock      Visit Diagnosis: Pulmonary fibrosis (HCC)  Patient's Home Medications on Admission:   Current Outpatient Medications:  .  acetaminophen (TYLENOL) 325 MG tablet, Take 2 tablets (650 mg total) by mouth every 6 (six) hours as needed for mild pain (or Fever >/= 101)., Disp: 40 tablet, Rfl: 0 .  ALPRAZolam (XANAX) 0.5 MG tablet, Take 0.5 mg by mouth 3 (three) times daily as needed. AS NEEDED FOR ANXIETY, Disp: , Rfl:  .  amLODipine (NORVASC) 2.5 MG tablet, Take 1 tablet (2.5 mg total) by mouth daily., Disp: 90 tablet, Rfl: 3 .  atorvastatin (LIPITOR) 20 MG tablet, Take 20 mg by mouth daily. , Disp: , Rfl:  .  cholecalciferol (VITAMIN D) 1000 units tablet, Take 1,000 Units by mouth daily., Disp: , Rfl:  .  clopidogrel (PLAVIX) 75 MG tablet, Take 1 tablet (75 mg total) by mouth daily., Disp: 30 tablet, Rfl: 11 .  doxazosin (CARDURA) 4 MG tablet, Take 2 mg by mouth every morning. , Disp: , Rfl:  .  isosorbide mononitrate (IMDUR) 30 MG 24 hr tablet, TAKE 1 TABLET (30 MG TOTAL) BY MOUTH DAILY., Disp: 90 tablet, Rfl: 1 .  magnesium gluconate (MAGONATE) 500 MG tablet, Take 500 mg by mouth daily., Disp: , Rfl:  .  metoprolol tartrate (LOPRESSOR) 50 MG tablet, Take 0.5 tablets (25 mg total) by mouth 2 (two) times daily., Disp: 90 tablet, Rfl: 1 .  nitroGLYCERIN (NITROSTAT) 0.4 MG SL tablet, Place 1 tablet (0.4 mg total) under the tongue every 5 (five) minutes as needed for chest pain., Disp: 25 tablet, Rfl: 3 .  pantoprazole (PROTONIX) 40 MG tablet, Take 40 mg by  mouth at bedtime. DAILY AT HS, Disp: , Rfl:  .  psyllium (REGULOID) 0.52 g capsule, Take 0.52 g by mouth daily., Disp: , Rfl:   Past Medical History: Past Medical History:  Diagnosis Date  . Arthritis    "all the joints" (02/06/2016)  . Coronary artery disease    a. PCI with DES to mid LAD 7/17 b.post PTCA and stenting of his left circumflex artery in 12/08  . Enlarged prostate   . GERD (gastroesophageal reflux disease)   . Hyperlipidemia   . Hypertension   . Ischemia   . Shortness of breath   . Skin disorder    Occurs at times as rash, whelps, blister-like areas  with itching sometimes-has occurred for years , but can not find an exact dx or cause    Tobacco Use: Social History   Tobacco Use  Smoking Status Never Smoker  Smokeless Tobacco Never Used    Labs: Recent Review Flowsheet Data    Labs for ITP Cardiac and Pulmonary Rehab Latest Ref Rng & Units 06/14/2007 01/05/2011 03/17/2014 02/06/2016 02/06/2016   Cholestrol 0 - 200 mg/dL - 956 213 - -   LDLCALC 0 - 99 mg/dL - 53 49 - -   HDL >08.65 mg/dL - 78.46 96.29(B) - -   Trlycerides 0.0 - 149.0 mg/dL - 284.1 324.4 - -   PHART 7.350 -  7.450 - - - - 7.336(L)   PCO2ART 35.0 - 45.0 mmHg - - - - 44.4   HCO3 20.0 - 24.0 mEq/L 23.3 - - 23.2 23.8   TCO2 0 - 100 mmol/L 24 - - 25 25   ACIDBASEDEF 0.0 - 2.0 mmol/L 2.0 - - 3.0(H) 2.0   O2SAT % - - - 72.0 97.0      Capillary Blood Glucose: No results found for: GLUCAP   Pulmonary Assessment Scores: Pulmonary Assessment Scores    Row Name 04/10/17 1130 04/11/17 0704       ADL UCSD   ADL Phase  Entry  Entry    SOB Score total  37  -      CAT Score   CAT Score  17 Entry  -      mMRC Score   mMRC Score  -  1       Pulmonary Function Assessment: Pulmonary Function Assessment - 04/05/17 1010      Breath   Bilateral Breath Sounds  Other    Other  course crackles in bases bilaterally    Shortness of Breath  Yes;Limiting activity       Exercise Target Goals:     Exercise Program Goal: Individual exercise prescription set with THRR, safety & activity barriers. Participant demonstrates ability to understand and report RPE using BORG scale, to self-measure pulse accurately, and to acknowledge the importance of the exercise prescription.  Exercise Prescription Goal: Starting with aerobic activity 30 plus minutes a day, 3 days per week for initial exercise prescription. Provide home exercise prescription and guidelines that participant acknowledges understanding prior to discharge.  Activity Barriers & Risk Stratification: Activity Barriers & Cardiac Risk Stratification - 04/05/17 0956      Activity Barriers & Cardiac Risk Stratification   Activity Barriers  Shortness of Breath;Arthritis       6 Minute Walk: 6 Minute Walk    Row Name 04/11/17 0700         6 Minute Walk   Phase  Initial     Distance  1540 feet     Walk Time  6 minutes     # of Rest Breaks  0     MPH  2.91     METS  3.22     RPE  13     Perceived Dyspnea   3     Symptoms  No     Resting HR  72 bpm     Resting BP  165/73     Resting Oxygen Saturation   95 %     Exercise Oxygen Saturation  during 6 min walk  90 %     Max Ex. HR  121 bpm     Max Ex. BP  220/78     2 Minute Post BP  175/77       Interval HR   1 Minute HR  97     2 Minute HR  100     3 Minute HR  107     4 Minute HR  121     5 Minute HR  114     6 Minute HR  119     2 Minute Post HR  119     Interval Heart Rate?  Yes       Interval Oxygen   Interval Oxygen?  Yes     Baseline Oxygen Saturation %  95 %     1 Minute Oxygen Saturation %  94 %     1 Minute Liters of Oxygen  0 L     2 Minute Oxygen Saturation %  91 %     2 Minute Liters of Oxygen  0 L     3 Minute Oxygen Saturation %  91 %     3 Minute Liters of Oxygen  0 L     4 Minute Oxygen Saturation %  90 %     4 Minute Liters of Oxygen  0 L     5 Minute Oxygen Saturation %  91 %     5 Minute Liters of Oxygen  0 L     6 Minute Oxygen  Saturation %  90 %     6 Minute Liters of Oxygen  0 L     2 Minute Post Oxygen Saturation %  91 %     2 Minute Post Liters of Oxygen  0 L        Oxygen Initial Assessment: Oxygen Initial Assessment - 04/11/17 0704      Initial 6 min Walk   Oxygen Used  None      Program Oxygen Prescription   Program Oxygen Prescription  None       Oxygen Re-Evaluation: Oxygen Re-Evaluation    Row Name 04/26/17 1152 05/20/17 1154           Program Oxygen Prescription   Program Oxygen Prescription  None  None        Home Oxygen   Home Oxygen Device  None  None      Sleep Oxygen Prescription  None  None      Home Exercise Oxygen Prescription  None  None      Home at Rest Exercise Oxygen Prescription  None  None         Oxygen Discharge (Final Oxygen Re-Evaluation): Oxygen Re-Evaluation - 05/20/17 1154      Program Oxygen Prescription   Program Oxygen Prescription  None      Home Oxygen   Home Oxygen Device  None    Sleep Oxygen Prescription  None    Home Exercise Oxygen Prescription  None    Home at Rest Exercise Oxygen Prescription  None       Initial Exercise Prescription: Initial Exercise Prescription - 04/11/17 0700      Date of Initial Exercise RX and Referring Provider   Date  04/09/17    Referring Provider  Dr. Molli Knock      Bike   Level  0.4    Minutes  17      NuStep   Level  2    Minutes  17    METs  1.5      Track   Laps  10    Minutes  17      Prescription Details   Frequency (times per week)  2      Intensity   THRR 40-80% of Max Heartrate  54-107    Ratings of Perceived Exertion  11-13    Perceived Dyspnea  0-4      Progression   Progression  Continue progressive overload as per policy without signs/symptoms or physical distress.      Resistance Training   Training Prescription  Yes    Weight  orange bands    Reps  10-15       Perform Capillary Blood Glucose checks as needed.  Exercise Prescription Changes: Exercise Prescription  Changes    Row Name 04/16/17 1300  04/18/17 1200 04/23/17 1200 04/25/17 1200 04/30/17 1200     Response to Exercise   Blood Pressure (Admit)  120/62  118/56  100/52  124/52  136/64   Blood Pressure (Exercise)  170/64  104/50  152/64  156/60  160/70   Blood Pressure (Exit)  120/60  100/50  104/62  118/70  126/66   Heart Rate (Admit)  63 bpm  47 bpm  52 bpm  56 bpm  59 bpm   Heart Rate (Exercise)  83 bpm  71 bpm  71 bpm  76 bpm  80 bpm   Heart Rate (Exit)  71 bpm  53 bpm  53 bpm  58 bpm  66 bpm   Oxygen Saturation (Admit)  97 %  98 %  96 %  95 %  96 %   Oxygen Saturation (Exercise)  95 %  91 %  91 %  92 %  93 %   Oxygen Saturation (Exit)  97 %  97 %  97 %  97 %  96 %   Rating of Perceived Exertion (Exercise)  13  15  12  13  13    Perceived Dyspnea (Exercise)  2  1  2  3  2    Duration  Continue with 45 min of aerobic exercise without signs/symptoms of physical distress.  Continue with 45 min of aerobic exercise without signs/symptoms of physical distress.  Continue with 45 min of aerobic exercise without signs/symptoms of physical distress.  Continue with 45 min of aerobic exercise without signs/symptoms of physical distress.  Continue with 45 min of aerobic exercise without signs/symptoms of physical distress.   Intensity  THRR unchanged  THRR unchanged  THRR unchanged  THRR unchanged  THRR unchanged     Progression   Progression  Continue to progress workloads to maintain intensity without signs/symptoms of physical distress.  Continue to progress workloads to maintain intensity without signs/symptoms of physical distress.  Continue to progress workloads to maintain intensity without signs/symptoms of physical distress.  Continue to progress workloads to maintain intensity without signs/symptoms of physical distress.  Continue to progress workloads to maintain intensity without signs/symptoms of physical distress.     Resistance Training   Training Prescription  Yes  Yes  Yes  Yes  Yes   Weight   orange bands  orange bands  orange bands  orange bands  orange bands   Reps  10-15  10-15  10-15  10-15  10-15     Bike   Level  0.6  0.6  0.8  0.8  0.8   Minutes  17  17  17  17  17      NuStep   Level  -  2  3  -  3   Minutes  -  17  17  -  17   METs  -  2.1  2.4  -  2.7     Track   Laps  12  14  12  16  13    Minutes  17  17  17  17  17    Row Name 05/02/17 1200 05/07/17 1200 05/09/17 1246 05/14/17 1200 05/16/17 1327     Response to Exercise   Blood Pressure (Admit)  120/60  118/56  104/50  106/60  118/54   Blood Pressure (Exercise)  158/64  130/60  160/58  150/80  154/70   Blood Pressure (Exit)  110/70  104/68  116/66  116/68  138/62   Heart Rate (Admit)  52 bpm  59 bpm  57 bpm  65 bpm  59 bpm   Heart Rate (Exercise)  90 bpm  88 bpm  81 bpm  82 bpm  118 bpm   Heart Rate (Exit)  69 bpm  63 bpm  57 bpm  60 bpm  80 bpm   Oxygen Saturation (Admit)  96 %  96 %  94 %  98 %  95 %   Oxygen Saturation (Exercise)  94 %  92 %  90 %  90 %  90 %   Oxygen Saturation (Exit)  98 %  96 %  96 %  97 %  93 %   Rating of Perceived Exertion (Exercise)  12  13  13  13  13    Perceived Dyspnea (Exercise)  1  2  2  2  2    Duration  Continue with 45 min of aerobic exercise without signs/symptoms of physical distress.  Continue with 45 min of aerobic exercise without signs/symptoms of physical distress.  Continue with 45 min of aerobic exercise without signs/symptoms of physical distress.  Continue with 45 min of aerobic exercise without signs/symptoms of physical distress.  Continue with 45 min of aerobic exercise without signs/symptoms of physical distress.   Intensity  THRR unchanged  THRR unchanged  THRR unchanged  THRR unchanged  THRR unchanged     Progression   Progression  Continue to progress workloads to maintain intensity without signs/symptoms of physical distress.  Continue to progress workloads to maintain intensity without signs/symptoms of physical distress.  Continue to progress workloads to  maintain intensity without signs/symptoms of physical distress.  Continue to progress workloads to maintain intensity without signs/symptoms of physical distress.  Continue to progress workloads to maintain intensity without signs/symptoms of physical distress.     Resistance Training   Training Prescription  Yes  Yes  Yes  Yes  Yes   Weight  orange bands  orange bands  orange bands  orange bands  orange bands   Reps  10-15  10-15  10-15  10-15  10-15   Time  10 Minutes  10 Minutes  10 Minutes  10 Minutes  10 Minutes     Bike   Level  1  1  1   1.3  -   Minutes  17  17  17  17   -     NuStep   Level  4  4  -  5  5   Minutes  17  17  -  17  17   METs  3.2  3  -  3.9  3.5     Track   Laps  -  13  15  16  17    Minutes  -  17  17  17  17       Exercise Comments:   Exercise Goals and Review: Exercise Goals    Row Name 04/05/17 0958             Exercise Goals   Increase Physical Activity  Yes       Intervention  Provide advice, education, support and counseling about physical activity/exercise needs.;Develop an individualized exercise prescription for aerobic and resistive training based on initial evaluation findings, risk stratification, comorbidities and participant's personal goals.       Expected Outcomes  Achievement of increased cardiorespiratory fitness and enhanced flexibility, muscular endurance and strength shown through measurements of functional capacity and personal statement of participant.  Increase Strength and Stamina  Yes       Intervention  Provide advice, education, support and counseling about physical activity/exercise needs.;Develop an individualized exercise prescription for aerobic and resistive training based on initial evaluation findings, risk stratification, comorbidities and participant's personal goals.       Expected Outcomes  Achievement of increased cardiorespiratory fitness and enhanced flexibility, muscular endurance and strength shown through  measurements of functional capacity and personal statement of participant.       Able to understand and use rate of perceived exertion (RPE) scale  Yes       Intervention  Provide education and explanation on how to use RPE scale       Expected Outcomes  Short Term: Able to use RPE daily in rehab to express subjective intensity level;Long Term:  Able to use RPE to guide intensity level when exercising independently       Able to understand and use Dyspnea scale  Yes       Intervention  Provide education and explanation on how to use Dyspnea scale       Expected Outcomes  Short Term: Able to use Dyspnea scale daily in rehab to express subjective sense of shortness of breath during exertion;Long Term: Able to use Dyspnea scale to guide intensity level when exercising independently       Knowledge and understanding of Target Heart Rate Range (THRR)  Yes       Intervention  Provide education and explanation of THRR including how the numbers were predicted and where they are located for reference       Expected Outcomes  Short Term: Able to state/look up THRR;Long Term: Able to use THRR to govern intensity when exercising independently;Short Term: Able to use daily as guideline for intensity in rehab       Understanding of Exercise Prescription  Yes       Intervention  Provide education, explanation, and written materials on patient's individual exercise prescription       Expected Outcomes  Short Term: Able to explain program exercise prescription;Long Term: Able to explain home exercise prescription to exercise independently          Exercise Goals Re-Evaluation : Exercise Goals Re-Evaluation    Row Name 04/29/17 1010 05/23/17 1640           Exercise Goal Re-Evaluation   Exercise Goals Review  Increase Strength and Stamina;Able to understand and use Dyspnea scale;Able to understand and use rate of perceived exertion (RPE) scale;Knowledge and understanding of Target Heart Rate Range  (THRR);Understanding of Exercise Prescription;Increase Physical Activity  Increase Strength and Stamina;Increase Physical Activity;Able to understand and use Dyspnea scale;Able to understand and use rate of perceived exertion (RPE) scale;Knowledge and understanding of Target Heart Rate Range (THRR);Understanding of Exercise Prescription      Comments  Patient has only attended 4 sessions. Will cont. to monitor and progress as able.   Patient has been progressing well in the program. Walking 16 laps (200 ft each) in 15 minutes. Will go over home exercise in the nexts few weeks. Will cont. to monitor.       Expected Outcomes  Through exercise at rehab and at home, patient will increase strength and stamina and decrease shortness of breath.  Through exercise at rehab and at home, patient will increase strength and stamina making ADL's easier to perform. Patient will also have a better understanding of safe exercise and what they are capable to do outside of clinical supervision.  Discharge Exercise Prescription (Final Exercise Prescription Changes): Exercise Prescription Changes - 05/16/17 1327      Response to Exercise   Blood Pressure (Admit)  118/54    Blood Pressure (Exercise)  154/70    Blood Pressure (Exit)  138/62    Heart Rate (Admit)  59 bpm    Heart Rate (Exercise)  118 bpm    Heart Rate (Exit)  80 bpm    Oxygen Saturation (Admit)  95 %    Oxygen Saturation (Exercise)  90 %    Oxygen Saturation (Exit)  93 %    Rating of Perceived Exertion (Exercise)  13    Perceived Dyspnea (Exercise)  2    Duration  Continue with 45 min of aerobic exercise without signs/symptoms of physical distress.    Intensity  THRR unchanged      Progression   Progression  Continue to progress workloads to maintain intensity without signs/symptoms of physical distress.      Resistance Training   Training Prescription  Yes    Weight  orange bands    Reps  10-15    Time  10 Minutes      NuStep    Level  5    Minutes  17    METs  3.5      Track   Laps  17    Minutes  17       Nutrition:  Target Goals: Understanding of nutrition guidelines, daily intake of sodium 1500mg , cholesterol 200mg , calories 30% from fat and 7% or less from saturated fats, daily to have 5 or more servings of fruits and vegetables.  Biometrics: Pre Biometrics - 04/05/17 1000      Pre Biometrics   Grip Strength  26 kg        Nutrition Therapy Plan and Nutrition Goals: Nutrition Therapy & Goals - 04/18/17 1421      Nutrition Therapy   Diet  Therapeutic Lifestyle Changes      Personal Nutrition Goals   Nutrition Goal  Identify food quantities necessary to achieve wt loss of  -2# per week to a goal wt loss of 2.7-10.9 kg (6-24 lb) at graduation from pulmonary rehab.      Intervention Plan   Intervention  Prescribe, educate and counsel regarding individualized specific dietary modifications aiming towards targeted core components such as weight, hypertension, lipid management, diabetes, heart failure and other comorbidities.    Expected Outcomes  Short Term Goal: Understand basic principles of dietary content, such as calories, fat, sodium, cholesterol and nutrients.;Long Term Goal: Adherence to prescribed nutrition plan.       Nutrition Discharge: Rate Your Plate Scores:   Nutrition Goals Re-Evaluation:   Nutrition Goals Discharge (Final Nutrition Goals Re-Evaluation):   Psychosocial: Target Goals: Acknowledge presence or absence of significant depression and/or stress, maximize coping skills, provide positive support system. Participant is able to verbalize types and ability to use techniques and skills needed for reducing stress and depression.  Initial Review & Psychosocial Screening: Initial Psych Review & Screening - 04/05/17 1011      Initial Review   Current issues with  None Identified      Family Dynamics   Good Support System?  Yes      Barriers   Psychosocial barriers  to participate in program  There are no identifiable barriers or psychosocial needs.      Screening Interventions   Interventions  Encouraged to exercise       Quality of Life Scores:  PHQ-9: Recent Review Flowsheet Data    Depression screen [facility] 2/9 04/05/2017   Decreased Interest 0   Down, Depressed, Hopeless 0   PHQ - 2 Score 0     Interpretation of Total Score  Total Score Depression Severity:  1-4 = Minimal depression, 5-9 = Mild depression, 10-14 = Moderate depression, 15-19 = Moderately severe depression, 20-27 = Severe depression   Psychosocial Evaluation and Intervention: Psychosocial Evaluation - 04/05/17 1014      Psychosocial Evaluation & Interventions   Interventions  Encouraged to exercise with the program and follow exercise prescription    Expected Outcomes  patient will remain free from psychosocial barriers to participation    Continue Psychosocial Services   No Follow up required       Psychosocial Re-Evaluation: Psychosocial Re-Evaluation    Row Name 04/26/17 1159 05/20/17 1201           Psychosocial Re-Evaluation   Current issues with  None Identified  None Identified      Expected Outcomes  patient will remain free from psychosocial barriers to participation in pulmonary rehab  patient will remain free from psychosocial barriers to participation in pulmonary rehab      Continue Psychosocial Services   No Follow up required  No Follow up required         Psychosocial Discharge (Final Psychosocial Re-Evaluation): Psychosocial Re-Evaluation - 05/20/17 1201      Psychosocial Re-Evaluation   Current issues with  None Identified    Expected Outcomes  patient will remain free from psychosocial barriers to participation in pulmonary rehab    Continue Psychosocial Services   No Follow up required       Education: Education Goals: Education classes will be provided on a weekly basis, covering required topics. Participant will state  understanding/return demonstration of topics presented.  Learning Barriers/Preferences: Learning Barriers/Preferences - 04/05/17 1009      Learning Barriers/Preferences   Learning Barriers  None    Learning Preferences  Written Material;Verbal Instruction;Group Instruction;Computer/Internet       Education Topics: Risk Factor Reduction:  -Group instruction that is supported by a PowerPoint presentation. Instructor discusses the definition of a risk factor, different risk factors for pulmonary disease, and how the heart and lungs work together.     Nutrition for Pulmonary Patient:  -Group instruction provided by PowerPoint slides, verbal discussion, and written materials to support subject matter. The instructor gives an explanation and review of healthy diet recommendations, which includes a discussion on weight management, recommendations for fruit and vegetable consumption, as well as protein, fluid, caffeine, fiber, sodium, sugar, and alcohol. Tips for eating when patients are short of breath are discussed.   PULMONARY REHAB OTHER RESPIRATORY from 05/09/2017 in [facility] CARDIAC REHAB  Date  04/25/17  Educator  edna  Instruction Review Code  2- meets goals/outcomes      Pursed Lip Breathing:  -Group instruction that is supported by demonstration and informational handouts. Instructor discusses the benefits of pursed lip and diaphragmatic breathing and detailed demonstration on how to preform both.     Oxygen Safety:  -Group instruction provided by PowerPoint, verbal discussion, and written material to support subject matter. There is an overview of "What is Oxygen" and "Why do we need it".  Instructor also reviews how to create a safe environment for oxygen use, the importance of using oxygen as prescribed, and the risks of noncompliance. There is a brief discussion on traveling with oxygen and resources the patient may  utilize.   PULMONARY REHAB OTHER RESPIRATORY  from 05/09/2017 in [facility] CARDIAC REHAB  Date  05/02/17  Educator  Daniel Nones  Instruction Review Code  2- meets goals/outcomes      Oxygen Equipment:  -Group instruction provided by [employer] Staff utilizing handouts, written materials, and equipment demonstrations.   Signs and Symptoms:  -Group instruction provided by written material and verbal discussion to support subject matter. Warning signs and symptoms of infection, stroke, and heart attack are reviewed and when to call the physician/911 reinforced. Tips for preventing the spread of infection discussed.   Advanced Directives:  -Group instruction provided by verbal instruction and written material to support subject matter. Instructor reviews Advanced Directive laws and proper instruction for filling out document.   Pulmonary Video:  -Group video education that reviews the importance of medication and oxygen compliance, exercise, good nutrition, pulmonary hygiene, and pursed lip and diaphragmatic breathing for the pulmonary patient.   Exercise for the Pulmonary Patient:  -Group instruction that is supported by a PowerPoint presentation. Instructor discusses benefits of exercise, core components of exercise, frequency, duration, and intensity of an exercise routine, importance of utilizing pulse oximetry during exercise, safety while exercising, and options of places to exercise outside of rehab.     Pulmonary Medications:  -Verbally interactive group education provided by instructor with focus on inhaled medications and proper administration.   PULMONARY REHAB OTHER RESPIRATORY from 05/09/2017 in [facility] CARDIAC REHAB  Date  05/09/17  Educator  Pharm D  Instruction Review Code  2- meets goals/outcomes      Anatomy and Physiology of the Respiratory System and Intimacy:  -Group instruction provided by PowerPoint, verbal discussion, and written material to support subject matter.  Instructor reviews respiratory cycle and anatomical components of the respiratory system and their functions. Instructor also reviews differences in obstructive and restrictive respiratory diseases with examples of each. Intimacy, Sex, and Sexuality differences are reviewed with a discussion on how relationships can change when diagnosed with pulmonary disease. Common sexual concerns are reviewed.   MD DAY -A group question and answer session with a medical doctor that allows participants to ask questions that relate to their pulmonary disease state.   PULMONARY REHAB OTHER RESPIRATORY from 05/09/2017 in [facility] CARDIAC REHAB  Date  04/16/17  Educator  Dr. Molli Knock  Instruction Review Code  2- meets goals/outcomes      OTHER EDUCATION -Group or individual verbal, written, or video instructions that support the educational goals of the pulmonary rehab program.   Knowledge Questionnaire Score: Knowledge Questionnaire Score - 04/10/17 1130      Knowledge Questionnaire Score   Pre Score  11/13       Core Components/Risk Factors/Patient Goals at Admission: Personal Goals and Risk Factors at Admission - 04/05/17 1011      Core Components/Risk Factors/Patient Goals on Admission   Improve shortness of breath with ADL's  Yes    Intervention  Provide education, individualized exercise plan and daily activity instruction to help decrease symptoms of SOB with activities of daily living.    Expected Outcomes  Short Term: Achieves a reduction of symptoms when performing activities of daily living.    Develop more efficient breathing techniques such as purse lipped breathing and diaphragmatic breathing; and practicing self-pacing with activity  Yes    Intervention  Provide education, demonstration and support about specific breathing techniuqes utilized for more efficient breathing. Include techniques such as pursed lipped breathing, diaphragmatic breathing  and self-pacing  activity.    Expected Outcomes  Short Term: Participant will be able to demonstrate and use breathing techniques as needed throughout daily activities.    Increase knowledge of respiratory medications and ability to use respiratory devices properly   Yes    Intervention  Provide education and demonstration as needed of appropriate use of medications, inhalers, and oxygen therapy.    Expected Outcomes  Short Term: Achieves understanding of medications use. Understands that oxygen is a medication prescribed by physician. Demonstrates appropriate use of inhaler and oxygen therapy.       Core Components/Risk Factors/Patient Goals Review:  Goals and Risk Factor Review    Row Name 04/26/17 1154 05/20/17 1154           Core Components/Risk Factors/Patient Goals Review   Personal Goals Review  Develop more efficient breathing techniques such as purse lipped breathing and diaphragmatic breathing and practicing self-pacing with activity.;Improve shortness of breath with ADL's;Increase knowledge of respiratory medications and ability to use respiratory devices properly.  Develop more efficient breathing techniques such as purse lipped breathing and diaphragmatic breathing and practicing self-pacing with activity.;Improve shortness of breath with ADL's;Increase knowledge of respiratory medications and ability to use respiratory devices properly.      Review  patient has only attended 4 sessions since admission and it is too early to evaluate progress towards goals  patient is doing well in pulmonary rehab. He has attended an education session with the pharmacist on pulmonary medications and has been observed utilizing PLB without prompting during exercise      Expected Outcomes  see admission expected outcomes.  see admission expected outcomes.         Core Components/Risk Factors/Patient Goals at Discharge (Final Review):  Goals and Risk Factor Review - 05/20/17 1154      Core Components/Risk  Factors/Patient Goals Review   Personal Goals Review  Develop more efficient breathing techniques such as purse lipped breathing and diaphragmatic breathing and practicing self-pacing with activity.;Improve shortness of breath with ADL's;Increase knowledge of respiratory medications and ability to use respiratory devices properly.    Review  patient is doing well in pulmonary rehab. He has attended an education session with the pharmacist on pulmonary medications and has been observed utilizing PLB without prompting during exercise    Expected Outcomes  see admission expected outcomes.       ITP Comments:   Comments: ITP REVIEW Pt is making expected progress toward pulmonary rehab goals after completing 10 sessions. Recommend continued exercise, life style modification, education, and utilization of breathing techniques to increase stamina and strength and decrease shortness of breath with exertion.

## 2017-05-27 ENCOUNTER — Ambulatory Visit: Payer: Medicare HMO | Admitting: Cardiovascular Disease

## 2017-05-27 NOTE — Progress Notes (Signed)
Cardiology Office Note    Date:  05/28/2017   ID:  Patrick Knapp, DOB [birth year], MRN [id number]  PCP:  Chilton GreathouseAvva, Ravisankar, MD  Cardiologist: Dr. Elease HashimotoNahser  Chief Complaint: 12 Months follow up  History of Present Illness:   Patrick Knapp is a 83 y.o. male with a hx of CAD, HTN, hyperlipidemia, pulmonary fibrosis and BPH presents for follow up.   [facility] patent stent in the LCx 90% proximal LAD stenosis and 90% D1 stenosis. He underwent orbital atherectomy and Bio freedom DES research stent to the LAD.  He was doing well on cardiac stand point when last seen by Dr Elease HashimotoNahser 02/17/2016.  Here today for follow up. Doing pulmonary rehab for recent dx of fibrosis twice a week without any chest pain. He has mild dyspnea with extreme exertion. His anginal symptoms is Fatigue. No recent complaint. Denies orthopnea, PNd, syncope, LE edema, melena or blood in his stool or urine.   Past Medical History:  Diagnosis Date  . Arthritis    "all the joints" (02/06/2016)  . Coronary artery disease    a. PCI with DES to mid LAD 7/17 b.post PTCA and stenting of his left circumflex artery in 12/08  . Enlarged prostate   . GERD (gastroesophageal reflux disease)   . Hyperlipidemia   . Hypertension   . Ischemia   . Pulmonary fibrosis, unspecified (HCC)   . Shortness of breath   . Skin disorder    Occurs at times as rash, whelps, blister-like areas  with itching sometimes-has occurred for years , but can not find an exact dx or cause    Past Surgical History:  Procedure Laterality Date  . CATARACT EXTRACTION, BILATERAL Bilateral 2016  . CORONARY ANGIOPLASTY WITH STENT PLACEMENT  06/2007   Est. EF of 55-60%PTCA and stenting of his left circumflex artery in 12/08  . EYE SURGERY    . INGUINAL HERNIA REPAIR Left   . JOINT REPLACEMENT    . KNEE ARTHROSCOPY  1990s  . ORBITAL FRACTURE SURGERY Left 1957  . RIGHT TOTAL KNEE ARTHROPLASTY Right 05/31/2014   Performed by  Loanne DrillingAluisio, Frank V, [facility]  . Right/Left Heart Cath and Coronary Angiography N/A 02/06/2016   Performed by Tonny Bollmanooper, Michael, [facility]  . TONSILLECTOMY      Current Medications:  Prior to Admission medications   Medication Sig Start Date End Date Taking? Authorizing Provider  acetaminophen (TYLENOL) 325 MG tablet Take 2 tablets (650 mg total) by mouth every 6 (six) hours as needed for mild pain (or Fever >/= 101). 06/01/14  Yes Perkins, Alexzandrew L, PA-C  ALPRAZolam (XANAX) 0.5 MG tablet Take 0.5 mg by mouth 3 (three) times daily as needed. AS NEEDED FOR ANXIETY 12/16/15  Yes [provider]  amLODipine (NORVASC) 2.5 MG tablet Take 1 tablet (2.5 mg total) by mouth daily. 02/17/16  Yes Nahser, Deloris PingPhilip J, MD  atorvastatin (LIPITOR) 20 MG tablet Take 20 mg by mouth daily.  12/23/13  Yes [provider]  clopidogrel (PLAVIX) 75 MG tablet Take 1 tablet (75 mg total) by mouth daily. 02/07/16  Yes Laverda Pageoberts, Lindsay B, NP  doxazosin (CARDURA) 4 MG tablet Take 2 mg by mouth every morning.    Yes [provider]  isosorbide mononitrate (IMDUR) 30 MG 24 hr tablet TAKE 1 TABLET (30 MG TOTAL) BY MOUTH DAILY. 05/14/17 05/14/18 Yes Nahser, Deloris PingPhilip J, MD  magnesium gluconate (MAGONATE) 500 MG tablet Take 500 mg  by mouth daily.   Yes [provider]  metoprolol tartrate (LOPRESSOR) 50 MG tablet Take 0.5 tablets (25 mg total) by mouth 2 (two) times daily. 03/14/17  Yes Weaver, Scott T, PA-C  nitroGLYCERIN (NITROSTAT) 0.4 MG SL tablet Place 1 tablet (0.4 mg total) under the tongue every 5 (five) minutes as needed for chest pain. 02/07/16  Yes Laverda Pageoberts, Lindsay B, NP  psyllium (REGULOID) 0.52 g capsule Take 0.52 g by mouth daily.   Yes [provider]  cholecalciferol (VITAMIN D) 1000 units tablet Take 1,000 Units by mouth daily.    [provider]     Allergies:   Chlorhexidine gluconate and Iodinated diagnostic agents   Social History    Socioeconomic History  . Marital status: Married    Spouse name: None  . Number of children: None  . Years of education: None  . Highest education level: None  Social Needs  . Financial resource strain: None  . Food insecurity - worry: None  . Food insecurity - inability: None  . Transportation needs - medical: None  . Transportation needs - non-medical: None  Occupational History  . None  Tobacco Use  . Smoking status: Never Smoker  . Smokeless tobacco: Never Used  Substance and Sexual Activity  . Alcohol use: Yes    Comment: 02/06/2016 "might have a glass of beer or wine once/month"  . Drug use: No  . Sexual activity: None  Other Topics Concern  . None  Social History Narrative  . None     Family History:  The patient's family history includes Coronary artery disease in his brother; Heart disease in his brother, father, and mother; Heart failure in his mother.   ROS:   Please see the history of present illness.    ROS All other systems reviewed and are negative.   PHYSICAL EXAM:   VS:  BP 126/70   Pulse (!) 54   Ht 5' 8.5" (1.74 m)   Wt 199 lb (90.3 kg)   SpO2 94%   BMI 29.82 kg/m    GEN: Well nourished, well developed, in no acute distress  HEENT: normal  Neck: no JVD, carotid bruits, or masses Cardiac: RRR; no murmurs, rubs, or gallops,no edema  Respiratory:  clear to auscultation bilaterally, normal work of breathing GI: soft, nontender, nondistended, + BS MS: no deformity or atrophy  Skin: warm and dry, no rash Neuro:  Alert and Oriented x 3, Strength and sensation are intact Psych: euthymic mood, full affect  Wt Readings from Last 3 Encounters:  05/28/17 199 lb (90.3 kg)  05/16/17 195 lb 8.8 oz (88.7 kg)  05/14/17 195 lb 15.8 oz (88.9 kg)      Studies/Labs Reviewed:   EKG:  EKG is not  ordered today.   Recent Labs: No results found for requested labs within last 8760 hours.   Lipid Panel    Component Value Date/Time   CHOL 115 03/17/2014  0923   TRIG 142.0 03/17/2014 0923   HDL 37.20 (L) 03/17/2014 0923   CHOLHDL 3 03/17/2014 0923   VLDL 28.4 03/17/2014 0923   LDLCALC 49 03/17/2014 0923    Additional studies/ records that were reviewed today include:   Right/Left Heart Cath and Coronary Angiography  02/06/16  Conclusion     Ost 1st Diag to 1st Diag lesion, 90 %stenosed.  Mid Cx lesion, 10 %stenosed.  Prox Cx lesion, 25 %stenosed.  LV end diastolic pressure is normal.  A drug eluting stent was successfully  placed.  Prox LAD to Mid LAD lesion, 90 %stenosed.  Post intervention, there is a 0% residual stenosis.   1. Normal right and left heart pressures with normal cardiac output 2. Continued patency of the stented segment in the left circumflex nonobstructive circumflex stenosis 3. Severe calcified stenosis of the proximal LAD treated successfully with orbital atherectomy and stenting with a bio freedom DES 4. Widely patent RCA   Diagnostic Diagram       Post-Intervention Diagram          ASSESSMENT & PLAN:    1. CAD - No angina. Continue current regimen of plavix, statin, BB and imdur.   2. HTN - Well controlled on current medications.   3. HLD - LDL 40 on 12/18/16. Continue statin. Followed by PCP.   4. Pulmonary fibrosis - Continue Rehab   Medication Adjustments/Labs and Tests Ordered: Current medicines are reviewed at length with the patient today.  Concerns regarding medicines are outlined above.  Medication changes, Labs and Tests ordered today are listed in the Patient Instructions below. Patient Instructions  Medication Instructions:  1. Your physician recommends that you continue on your current medications as directed. Please refer to the Current Medication list given to you today.   Labwork: NONE ORDERED TODAY  Testing/Procedures: NONE ORDERED TODAY  Follow-Up: 1. Your physician wants you to follow-up in: 1 YEAR WITH DR. Elease Hashimoto You will receive a reminder letter in the  mail two months in advance. If you don't receive a letter, please call our office to schedule the follow-up appointment.   Any Other Special Instructions Will Be Listed Below (If Applicable).     If you need a refill on your cardiac medications before your next appointment, please call your pharmacy.      Lorelei Pont, [place]  05/28/2017 8:24 AM    [facility] [address] [place], [place], [place]  [address] Phone: [phone]; Fax: [phone]

## 2017-05-28 ENCOUNTER — Encounter: Payer: Self-pay | Admitting: Physician Assistant

## 2017-05-28 ENCOUNTER — Encounter (HOSPITAL_COMMUNITY)
Admission: RE | Admit: 2017-05-28 | Discharge: 2017-05-28 | Disposition: A | Discharge status: Home / Self Care | Payer: Non-veteran care | Source: Ambulatory Visit | Attending: Pulmonary Disease | Admitting: Pulmonary Disease

## 2017-05-28 ENCOUNTER — Ambulatory Visit: Payer: Medicare HMO | Admitting: Physician Assistant

## 2017-05-28 ENCOUNTER — Encounter (INDEPENDENT_AMBULATORY_CARE_PROVIDER_SITE_OTHER): Payer: Self-pay

## 2017-05-28 VITALS — BP 126/70 | HR 54 | Ht 68.5 in | Wt 199.0 lb

## 2017-05-28 VITALS — Wt 198.9 lb

## 2017-05-28 DIAGNOSIS — I25119 Atherosclerotic heart disease of native coronary artery with unspecified angina pectoris: Secondary | ICD-10-CM

## 2017-05-28 DIAGNOSIS — J84112 Idiopathic pulmonary fibrosis: Secondary | ICD-10-CM

## 2017-05-28 DIAGNOSIS — E782 Mixed hyperlipidemia: Secondary | ICD-10-CM

## 2017-05-28 DIAGNOSIS — I10 Essential (primary) hypertension: Secondary | ICD-10-CM

## 2017-05-28 DIAGNOSIS — J8410 Pulmonary fibrosis, unspecified: Secondary | ICD-10-CM

## 2017-05-28 NOTE — Patient Instructions (Signed)
Medication Instructions:  1. Your physician recommends that you continue on your current medications as directed. Please refer to the Current Medication list given to you today.   Labwork: NONE ORDERED TODAY  Testing/Procedures: NONE ORDERED TODAY  Follow-Up: 1. Your physician wants you to follow-up in: 1 YEAR WITH DR. Elease HashimotoNAHSER You will receive a reminder letter in the mail two months in advance. If you don't receive a letter, please call our office to schedule the follow-up appointment.   Any Other Special Instructions Will Be Listed Below (If Applicable).     If you need a refill on your cardiac medications before your next appointment, please call your pharmacy.

## 2017-05-28 NOTE — Progress Notes (Signed)
Daily Session Note  Patient Details  Name: Patrick Knapp MRN: [id number] Date of Birth: 1928/01/27 Referring Provider:     Pulmonary Rehab Walk Test from 04/09/2017 in [place]  Referring Provider  Dr. Nelda Marseille      Encounter Date: 05/28/2017  Check In: Session Check In - 05/28/17 1047      Check-In   Location  [facility]    Staff Present  Su Hilt, MS, ACSM RCEP, Exercise Physiologist;Joan [place] Reeves, RN, Luisa Hart, RN, Roque Cash, RN    Supervising physician immediately available to respond to emergencies  [facility] immediately available    Physician(s)  Dr. Maylene Roes    Medication changes reported      No    Fall or balance concerns reported     No    Tobacco Cessation  No Change    Warm-up and Cool-down  Performed as group-led instruction    Resistance Training Performed  Yes    VAD Patient?  No      Pain Assessment   Currently in Pain?  No/denies    Multiple Pain Sites  No       Capillary Blood Glucose: No results found for this or any previous visit (from the past 24 hour(s)).  Exercise Prescription Changes - 05/28/17 1211      Response to Exercise   Blood Pressure (Admit)  110/50    Blood Pressure (Exercise)  152/64    Blood Pressure (Exit)  106/62    Heart Rate (Admit)  51 bpm    Heart Rate (Exercise)  86 bpm    Heart Rate (Exit)  61 bpm    Oxygen Saturation (Admit)  97 %    Oxygen Saturation (Exercise)  89 %    Oxygen Saturation (Exit)  96 %    Rating of Perceived Exertion (Exercise)  12    Perceived Dyspnea (Exercise)  1    Duration  Continue with 45 min of aerobic exercise without signs/symptoms of physical distress.    Intensity  THRR unchanged      Progression   Progression  Continue to progress workloads to maintain intensity without signs/symptoms of physical distress.      Resistance Training   Training Prescription  Yes    Weight  orange bands    Reps  10-15    Time   10 Minutes      Bike   Level  1.3    Minutes  17      NuStep   Level  5    Minutes  17      Track   Laps  15    Minutes  17       Social History   Tobacco Use  Smoking Status Never Smoker  Smokeless Tobacco Never Used    Goals Met:  Independence with exercise equipment Improved SOB with ADL's Using PLB without cueing & demonstrates good technique Exercise tolerated well No report of cardiac concerns or symptoms Strength training completed today  Goals Unmet:  Not Applicable  Comments: Service time is from 1030 to 1210   Dr. Rush Farmer is Medical Director for Pulmonary Rehab at [facility].

## 2017-06-04 ENCOUNTER — Encounter (HOSPITAL_COMMUNITY)
Admission: RE | Admit: 2017-06-04 | Discharge: 2017-06-04 | Disposition: A | Discharge status: Home / Self Care | Payer: Non-veteran care | Source: Ambulatory Visit | Attending: Pulmonary Disease | Admitting: Pulmonary Disease

## 2017-06-04 VITALS — Wt 200.2 lb

## 2017-06-04 DIAGNOSIS — J8410 Pulmonary fibrosis, unspecified: Secondary | ICD-10-CM

## 2017-06-04 DIAGNOSIS — J84112 Idiopathic pulmonary fibrosis: Secondary | ICD-10-CM | POA: Diagnosis not present

## 2017-06-04 NOTE — Progress Notes (Signed)
Daily Session Note  Patient Details  Name: Patrick Knapp MRN: [id number] Date of Birth: 1928-01-22 Referring Provider:     Pulmonary Rehab Walk Test from 04/09/2017 in [place]  Referring Provider  Dr. Nelda Marseille      Encounter Date: 06/04/2017  Check In: Session Check In - 06/04/17 1030      Check-In   Location  [facility]    Staff Present  Rosebud Poles, RN, BSN;Molly diVincenzo, MS, ACSM RCEP, Exercise Physiologist;Portia Rollene Rotunda, RN, Roque Cash, RN    Supervising physician immediately available to respond to emergencies  [facility] immediately available    Physician(s)  Dr. Maylene Roes    Medication changes reported      No    Fall or balance concerns reported     No    Tobacco Cessation  No Change    Warm-up and Cool-down  Performed as group-led instruction    Resistance Training Performed  Yes    VAD Patient?  No      Pain Assessment   Currently in Pain?  No/denies    Multiple Pain Sites  No       Capillary Blood Glucose: No results found for this or any previous visit (from the past 24 hour(s)).  Exercise Prescription Changes - 06/04/17 1200      Response to Exercise   Blood Pressure (Admit)  138/70    Blood Pressure (Exercise)  130/50    Blood Pressure (Exit)  118/60    Heart Rate (Admit)  76 bpm    Heart Rate (Exercise)  122 bpm    Heart Rate (Exit)  79 bpm    Oxygen Saturation (Admit)  95 %    Oxygen Saturation (Exercise)  93 %    Oxygen Saturation (Exit)  96 %    Rating of Perceived Exertion (Exercise)  13    Perceived Dyspnea (Exercise)  3    Duration  Continue with 45 min of aerobic exercise without signs/symptoms of physical distress.    Intensity  THRR unchanged      Progression   Progression  Continue to progress workloads to maintain intensity without signs/symptoms of physical distress.      Resistance Training   Training Prescription  Yes    Weight  orange bands    Reps  10-15    Time  10 Minutes      Bike   Level  1.3 HR increased with level will decresce back to 1.0    Minutes  17      NuStep   Level  5    Minutes  17    METs  3.2      Track   Laps  17    Minutes  17       Social History   Tobacco Use  Smoking Status Never Smoker  Smokeless Tobacco Never Used    Goals Met:  No report of cardiac concerns or symptoms Strength training completed today  Goals Unmet:  Not Applicable  Comments: Service time is from 1030 to 1215    Dr. Rush Farmer is Medical Director for Pulmonary Rehab at [facility].

## 2017-06-06 ENCOUNTER — Encounter (HOSPITAL_COMMUNITY): Payer: Non-veteran care

## 2017-06-11 ENCOUNTER — Other Ambulatory Visit: Payer: Self-pay | Admitting: Cardiovascular Disease

## 2017-06-11 ENCOUNTER — Encounter (HOSPITAL_COMMUNITY)
Admission: RE | Admit: 2017-06-11 | Discharge: 2017-06-11 | Disposition: A | Discharge status: Home / Self Care | Payer: Non-veteran care | Source: Ambulatory Visit | Attending: Pulmonary Disease | Admitting: Pulmonary Disease

## 2017-06-11 DIAGNOSIS — J84112 Idiopathic pulmonary fibrosis: Secondary | ICD-10-CM | POA: Insufficient documentation

## 2017-06-11 DIAGNOSIS — J8410 Pulmonary fibrosis, unspecified: Secondary | ICD-10-CM

## 2017-06-13 ENCOUNTER — Encounter (HOSPITAL_COMMUNITY)
Admission: RE | Admit: 2017-06-13 | Discharge: 2017-06-13 | Disposition: A | Discharge status: Home / Self Care | Payer: Non-veteran care | Source: Ambulatory Visit | Attending: Pulmonary Disease | Admitting: Pulmonary Disease

## 2017-06-13 VITALS — Wt 196.7 lb

## 2017-06-13 DIAGNOSIS — J84112 Idiopathic pulmonary fibrosis: Secondary | ICD-10-CM | POA: Diagnosis not present

## 2017-06-13 DIAGNOSIS — J8410 Pulmonary fibrosis, unspecified: Secondary | ICD-10-CM

## 2017-06-13 NOTE — Progress Notes (Signed)
Pulmonary Individual Treatment Plan  Patient Details  Name: Patrick Knapp MRN: [id number] Date of Birth: 12-07-1927 Referring Provider:     Pulmonary Rehab Walk Test from 04/09/2017 in [facility]  Referring Provider  Dr. Molli Knock      Initial Encounter Date:    Pulmonary Rehab Walk Test from 04/09/2017 in [facility] CARDI[facility] REHAB  Date  04/09/17  Referring Provider  Dr. Molli Knock      Visit Diagnosis: Pulmonary fibrosis (HCC)  Patient's Home Medications on Admission:   Current Outpatient Medications:  .  acetaminophen (TYLENOL) 325 MG tablet, Take 2 tablets (650 mg total) by mouth every 6 (six) hours as needed for mild pain (or Fever >/= 101)., Disp: 40 tablet, Rfl: 0 .  ALPRAZolam (XANAX) 0.5 MG tablet, Take 0.5 mg by mouth 3 (three) times daily as needed. AS NEEDED FOR ANXIETY, Disp: , Rfl:  .  amLODipine (NORVASC) 2.5 MG tablet, Take 1 tablet (2.5 mg total) by mouth daily., Disp: 90 tablet, Rfl: 3 .  atorvastatin (LIPITOR) 20 MG tablet, Take 20 mg by mouth daily. , Disp: , Rfl:  .  cholecalciferol (VITAMIN D) 1000 units tablet, Take 1,000 Units by mouth daily., Disp: , Rfl:  .  clopidogrel (PLAVIX) 75 MG tablet, Take 1 tablet (75 mg total) by mouth daily., Disp: 30 tablet, Rfl: 11 .  doxazosin (CARDURA) 4 MG tablet, Take 2 mg by mouth every morning. , Disp: , Rfl:  .  isosorbide mononitrate (IMDUR) 30 MG 24 hr tablet, TAKE 1 TABLET (30 MG TOTAL) BY MOUTH DAILY., Disp: 90 tablet, Rfl: 1 .  magnesium gluconate (MAGONATE) 500 MG tablet, Take 500 mg by mouth daily., Disp: , Rfl:  .  metoprolol tartrate (LOPRESSOR) 50 MG tablet, Take 0.5 tablets (25 mg total) by mouth 2 (two) times daily., Disp: 90 tablet, Rfl: 1 .  nitroGLYCERIN (NITROSTAT) 0.4 MG SL tablet, Place 1 tablet (0.4 mg total) under the tongue every 5 (five) minutes as needed for chest pain., Disp: 25 tablet, Rfl: 3 .  psyllium (REGULOID) 0.52 g capsule, Take 0.52 g by mouth  daily., Disp: , Rfl:   Past Medical History: Past Medical History:  Diagnosis Date  . Arthritis    "all the joints" (02/06/2016)  . Coronary artery disease    a. PCI with DES to mid LAD 7/17 b.post PTCA and stenting of his left circumflex artery in 12/08  . Enlarged prostate   . GERD (gastroesophageal reflux disease)   . Hyperlipidemia   . Hypertension   . Ischemia   . Pulmonary fibrosis, unspecified (HCC)   . Shortness of breath   . Skin disorder    Occurs at times as rash, whelps, blister-like areas  with itching sometimes-has occurred for years , but can not find an exact dx or cause    Tobacco Use: Social History   Tobacco Use  Smoking Status Never Smoker  Smokeless Tobacco Never Used    Labs: Recent Review Flowsheet Data    Labs for ITP Cardiac and Pulmonary Rehab Latest Ref Rng & Units 06/14/2007 01/05/2011 03/17/2014 02/06/2016 02/06/2016   Cholestrol 0 - 200 mg/dL - 956 213 - -   LDLCALC 0 - 99 mg/dL - 53 49 - -   HDL >08.65 mg/dL - 78.46 96.29(B) - -   Trlycerides 0.0 - 149.0 mg/dL - 284.1 324.4 - -   PHART 7.350 - 7.450 - - - - 7.336(L)   PCO2ART 35.0 - 45.0 mmHg - - - -  44.4   HCO3 20.0 - 24.0 mEq/L 23.3 - - 23.2 23.8   TCO2 0 - 100 mmol/L 24 - - 25 25   ACIDBASEDEF 0.0 - 2.0 mmol/L 2.0 - - 3.0(H) 2.0   O2SAT % - - - 72.0 97.0      Capillary Blood Glucose: No results found for: GLUCAP   Pulmonary Assessment Scores: Pulmonary Assessment Scores    Row Name 04/11/17 0704         ADL UCSD   ADL Phase  Entry       mMRC Score   mMRC Score  1        Pulmonary Function Assessment: Pulmonary Function Assessment - 04/05/17 1010      Breath   Bilateral Breath Sounds  Other    Other  course crackles in bases bilaterally    Shortness of Breath  Yes;Limiting activity       Exercise Target Goals:    Exercise Program Goal: Individual exercise prescription set with THRR, safety & activity barriers. Participant demonstrates ability to understand and  report RPE using BORG scale, to self-measure pulse accurately, and to acknowledge the importance of the exercise prescription.  Exercise Prescription Goal: Starting with aerobic activity 30 plus minutes a day, 3 days per week for initial exercise prescription. Provide home exercise prescription and guidelines that participant acknowledges understanding prior to discharge.  Activity Barriers & Risk Stratification: Activity Barriers & Cardiac Risk Stratification - 04/05/17 0956      Activity Barriers & Cardiac Risk Stratification   Activity Barriers  Shortness of Breath;Arthritis       6 Minute Walk: 6 Minute Walk    Row Name 04/11/17 0700         6 Minute Walk   Phase  Initial     Distance  1540 feet     Walk Time  6 minutes     # of Rest Breaks  0     MPH  2.91     METS  3.22     RPE  13     Perceived Dyspnea   3     Symptoms  No     Resting HR  72 bpm     Resting BP  165/73     Resting Oxygen Saturation   95 %     Exercise Oxygen Saturation  during 6 min walk  90 %     Max Ex. HR  121 bpm     Max Ex. BP  220/78     2 Minute Post BP  175/77       Interval HR   1 Minute HR  97     2 Minute HR  100     3 Minute HR  107     4 Minute HR  121     5 Minute HR  114     6 Minute HR  119     2 Minute Post HR  119     Interval Heart Rate?  Yes       Interval Oxygen   Interval Oxygen?  Yes     Baseline Oxygen Saturation %  95 %     1 Minute Oxygen Saturation %  94 %     1 Minute Liters of Oxygen  0 L     2 Minute Oxygen Saturation %  91 %     2 Minute Liters of Oxygen  0 L     3 Minute Oxygen  Saturation %  91 %     3 Minute Liters of Oxygen  0 L     4 Minute Oxygen Saturation %  90 %     4 Minute Liters of Oxygen  0 L     5 Minute Oxygen Saturation %  91 %     5 Minute Liters of Oxygen  0 L     6 Minute Oxygen Saturation %  90 %     6 Minute Liters of Oxygen  0 L     2 Minute Post Oxygen Saturation %  91 %     2 Minute Post Liters of Oxygen  0 L        Oxygen  Initial Assessment: Oxygen Initial Assessment - 04/11/17 0704      Initial 6 min Walk   Oxygen Used  None      Program Oxygen Prescription   Program Oxygen Prescription  None       Oxygen Re-Evaluation: Oxygen Re-Evaluation    Row Name 04/26/17 1152 05/20/17 1154 06/10/17 1757         Program Oxygen Prescription   Program Oxygen Prescription  None  None  None       Home Oxygen   Home Oxygen Device  None  None  None     Sleep Oxygen Prescription  None  None  None     Home Exercise Oxygen Prescription  None  None  None     Home at Rest Exercise Oxygen Prescription  None  None  None        Oxygen Discharge (Final Oxygen Re-Evaluation): Oxygen Re-Evaluation - 06/10/17 1757      Program Oxygen Prescription   Program Oxygen Prescription  None      Home Oxygen   Home Oxygen Device  None    Sleep Oxygen Prescription  None    Home Exercise Oxygen Prescription  None    Home at Rest Exercise Oxygen Prescription  None       Initial Exercise Prescription: Initial Exercise Prescription - 04/11/17 0700      Date of Initial Exercise RX and Referring Provider   Date  04/09/17    Referring Provider  Dr. Molli Knock      Bike   Level  0.4    Minutes  17      NuStep   Level  2    Minutes  17    METs  1.5      Track   Laps  10    Minutes  17      Prescription Details   Frequency (times per week)  2      Intensity   THRR 40-80% of Max Heartrate  54-107    Ratings of Perceived Exertion  11-13    Perceived Dyspnea  0-4      Progression   Progression  Continue progressive overload as per policy without signs/symptoms or physical distress.      Resistance Training   Training Prescription  Yes    Weight  orange bands    Reps  10-15       Perform Capillary Blood Glucose checks as needed.  Exercise Prescription Changes: Exercise Prescription Changes    Row Name 04/16/17 1300 04/18/17 1200 04/23/17 1200 04/25/17 1200 04/30/17 1200     Response to Exercise   Blood  Pressure (Admit)  120/62  118/56  100/52  124/52  136/64   Blood Pressure (Exercise)  170/64  104/50  152/64  156/60  160/70   Blood Pressure (Exit)  120/60  100/50  104/62  118/70  126/66   Heart Rate (Admit)  63 bpm  47 bpm  52 bpm  56 bpm  59 bpm   Heart Rate (Exercise)  83 bpm  71 bpm  71 bpm  76 bpm  80 bpm   Heart Rate (Exit)  71 bpm  53 bpm  53 bpm  58 bpm  66 bpm   Oxygen Saturation (Admit)  97 %  98 %  96 %  95 %  96 %   Oxygen Saturation (Exercise)  95 %  91 %  91 %  92 %  93 %   Oxygen Saturation (Exit)  97 %  97 %  97 %  97 %  96 %   Rating of Perceived Exertion (Exercise)  13  15  12  13  13    Perceived Dyspnea (Exercise)  2  1  2  3  2    Duration  Continue with 45 min of aerobic exercise without signs/symptoms of physical distress.  Continue with 45 min of aerobic exercise without signs/symptoms of physical distress.  Continue with 45 min of aerobic exercise without signs/symptoms of physical distress.  Continue with 45 min of aerobic exercise without signs/symptoms of physical distress.  Continue with 45 min of aerobic exercise without signs/symptoms of physical distress.   Intensity  THRR unchanged  THRR unchanged  THRR unchanged  THRR unchanged  THRR unchanged     Progression   Progression  Continue to progress workloads to maintain intensity without signs/symptoms of physical distress.  Continue to progress workloads to maintain intensity without signs/symptoms of physical distress.  Continue to progress workloads to maintain intensity without signs/symptoms of physical distress.  Continue to progress workloads to maintain intensity without signs/symptoms of physical distress.  Continue to progress workloads to maintain intensity without signs/symptoms of physical distress.     Resistance Training   Training Prescription  Yes  Yes  Yes  Yes  Yes   Weight  orange bands  orange bands  orange bands  orange bands  orange bands   Reps  10-15  10-15  10-15  10-15  10-15     Bike    Level  0.6  0.6  0.8  0.8  0.8   Minutes  17  17  17  17  17      NuStep   Level  -  2  3  -  3   Minutes  -  17  17  -  17   METs  -  2.1  2.4  -  2.7     Track   Laps  12  14  12  16  13    Minutes  17  17  17  17  17    Row Name 05/02/17 1200 05/07/17 1200 05/09/17 1246 05/14/17 1200 05/16/17 1327     Response to Exercise   Blood Pressure (Admit)  120/60  118/56  104/50  106/60  118/54   Blood Pressure (Exercise)  158/64  130/60  160/58  150/80  154/70   Blood Pressure (Exit)  110/70  104/68  116/66  116/68  138/62   Heart Rate (Admit)  52 bpm  59 bpm  57 bpm  65 bpm  59 bpm   Heart Rate (Exercise)  90 bpm  88 bpm  81 bpm  82 bpm  118 bpm   Heart Rate (Exit)  69 bpm  63 bpm  57 bpm  60 bpm  80 bpm   Oxygen Saturation (Admit)  96 %  96 %  94 %  98 %  95 %   Oxygen Saturation (Exercise)  94 %  92 %  90 %  90 %  90 %   Oxygen Saturation (Exit)  98 %  96 %  96 %  97 %  93 %   Rating of Perceived Exertion (Exercise)  12  13  13  13  13    Perceived Dyspnea (Exercise)  1  2  2  2  2    Duration  Continue with 45 min of aerobic exercise without signs/symptoms of physical distress.  Continue with 45 min of aerobic exercise without signs/symptoms of physical distress.  Continue with 45 min of aerobic exercise without signs/symptoms of physical distress.  Continue with 45 min of aerobic exercise without signs/symptoms of physical distress.  Continue with 45 min of aerobic exercise without signs/symptoms of physical distress.   Intensity  THRR unchanged  THRR unchanged  THRR unchanged  THRR unchanged  THRR unchanged     Progression   Progression  Continue to progress workloads to maintain intensity without signs/symptoms of physical distress.  Continue to progress workloads to maintain intensity without signs/symptoms of physical distress.  Continue to progress workloads to maintain intensity without signs/symptoms of physical distress.  Continue to progress workloads to maintain intensity without  signs/symptoms of physical distress.  Continue to progress workloads to maintain intensity without signs/symptoms of physical distress.     Resistance Training   Training Prescription  Yes  Yes  Yes  Yes  Yes   Weight  orange bands  orange bands  orange bands  orange bands  orange bands   Reps  10-15  10-15  10-15  10-15  10-15   Time  10 Minutes  10 Minutes  10 Minutes  10 Minutes  10 Minutes     Bike   Level  1  1  1   1.3  -   Minutes  17  17  17  17   -     NuStep   Level  4  4  -  5  5   Minutes  17  17  -  17  17   METs  3.2  3  -  3.9  3.5     Track   Laps  -  13  15  16  17    Minutes  -  17  17  17  17    Row Name 05/28/17 1211 06/04/17 1200           Response to Exercise   Blood Pressure (Admit)  110/50  138/70      Blood Pressure (Exercise)  152/64  130/50      Blood Pressure (Exit)  106/62  118/60      Heart Rate (Admit)  51 bpm  76 bpm      Heart Rate (Exercise)  86 bpm  122 bpm      Heart Rate (Exit)  61 bpm  79 bpm      Oxygen Saturation (Admit)  97 %  95 %      Oxygen Saturation (Exercise)  89 %  93 %      Oxygen Saturation (Exit)  96 %  96 %      Rating of Perceived Exertion (Exercise)  12  13      Perceived Dyspnea (  Exercise)  1  3      Duration  Continue with 45 min of aerobic exercise without signs/symptoms of physical distress.  Continue with 45 min of aerobic exercise without signs/symptoms of physical distress.      Intensity  THRR unchanged  THRR unchanged        Progression   Progression  Continue to progress workloads to maintain intensity without signs/symptoms of physical distress.  Continue to progress workloads to maintain intensity without signs/symptoms of physical distress.        Resistance Training   Training Prescription  Yes  Yes      Weight  orange bands  orange bands      Reps  10-15  10-15      Time  10 Minutes  10 Minutes        Bike   Level  1.3  1.3 HR increased with level will decresce back to 1.0      Minutes  17  17         NuStep   Level  5  5      Minutes  17  17      METs  -  3.2        Track   Laps  15  17      Minutes  17  17         Exercise Comments:   Exercise Goals and Review: Exercise Goals    Row Name 04/05/17 0958             Exercise Goals   Increase Physical Activity  Yes       Intervention  Provide advice, education, support and counseling about physical activity/exercise needs.;Develop an individualized exercise prescription for aerobic and resistive training based on initial evaluation findings, risk stratification, comorbidities and participant's personal goals.       Expected Outcomes  Achievement of increased cardiorespiratory fitness and enhanced flexibility, muscular endurance and strength shown through measurements of functional capacity and personal statement of participant.       Increase Strength and Stamina  Yes       Intervention  Provide advice, education, support and counseling about physical activity/exercise needs.;Develop an individualized exercise prescription for aerobic and resistive training based on initial evaluation findings, risk stratification, comorbidities and participant's personal goals.       Expected Outcomes  Achievement of increased cardiorespiratory fitness and enhanced flexibility, muscular endurance and strength shown through measurements of functional capacity and personal statement of participant.       Able to understand and use rate of perceived exertion (RPE) scale  Yes       Intervention  Provide education and explanation on how to use RPE scale       Expected Outcomes  Short Term: Able to use RPE daily in rehab to express subjective intensity level;Long Term:  Able to use RPE to guide intensity level when exercising independently       Able to understand and use Dyspnea scale  Yes       Intervention  Provide education and explanation on how to use Dyspnea scale       Expected Outcomes  Short Term: Able to use Dyspnea scale daily in rehab to  express subjective sense of shortness of breath during exertion;Long Term: Able to use Dyspnea scale to guide intensity level when exercising independently       Knowledge and understanding of Target Heart Rate Range (THRR)  Yes  Intervention  Provide education and explanation of THRR including how the numbers were predicted and where they are located for reference       Expected Outcomes  Short Term: Able to state/look up THRR;Long Term: Able to use THRR to govern intensity when exercising independently;Short Term: Able to use daily as guideline for intensity in rehab       Understanding of Exercise Prescription  Yes       Intervention  Provide education, explanation, and written materials on patient's individual exercise prescription       Expected Outcomes  Short Term: Able to explain program exercise prescription;Long Term: Able to explain home exercise prescription to exercise independently          Exercise Goals Re-Evaluation : Exercise Goals Re-Evaluation    Row Name 04/29/17 1010 05/23/17 1640 06/10/17 1627         Exercise Goal Re-Evaluation   Exercise Goals Review  Increase Strength and Stamina;Able to understand and use Dyspnea scale;Able to understand and use rate of perceived exertion (RPE) scale;Knowledge and understanding of Target Heart Rate Range (THRR);Understanding of Exercise Prescription;Increase Physical Activity  Increase Strength and Stamina;Increase Physical Activity;Able to understand and use Dyspnea scale;Able to understand and use rate of perceived exertion (RPE) scale;Knowledge and understanding of Target Heart Rate Range (THRR);Understanding of Exercise Prescription  Increase Strength and Stamina;Increase Physical Activity;Able to understand and use Dyspnea scale;Able to understand and use rate of perceived exertion (RPE) scale;Knowledge and understanding of Target Heart Rate Range (THRR);Understanding of Exercise Prescription     Comments  Patient has only  attended 4 sessions. Will cont. to monitor and progress as able.   Patient has been progressing well in the program. Walking 16 laps (200 ft each) in 15 minutes. Will go over home exercise in the nexts few weeks. Will cont. to monitor.   Patient has been progressing well in the program. Walking 17 laps (200 ft each) in 15 minutes. Will go over home exercise in the nexts few weeks. Will cont. to monitor.      Expected Outcomes  Through exercise at rehab and at home, patient will increase strength and stamina and decrease shortness of breath.  Through exercise at rehab and at home, patient will increase strength and stamina making ADL's easier to perform. Patient will also have a better understanding of safe exercise and what they are capable to do outside of clinical supervision.  Through exercise at rehab and at home, patient will increase strength and stamina making ADL's easier to perform. Patient will also have a better understanding of safe exercise and what they are capable to do outside of clinical supervision.        Discharge Exercise Prescription (Final Exercise Prescription Changes): Exercise Prescription Changes - 06/04/17 1200      Response to Exercise   Blood Pressure (Admit)  138/70    Blood Pressure (Exercise)  130/50    Blood Pressure (Exit)  118/60    Heart Rate (Admit)  76 bpm    Heart Rate (Exercise)  122 bpm    Heart Rate (Exit)  79 bpm    Oxygen Saturation (Admit)  95 %    Oxygen Saturation (Exercise)  93 %    Oxygen Saturation (Exit)  96 %    Rating of Perceived Exertion (Exercise)  13    Perceived Dyspnea (Exercise)  3    Duration  Continue with 45 min of aerobic exercise without signs/symptoms of physical distress.    Intensity  THRR unchanged      Progression   Progression  Continue to progress workloads to maintain intensity without signs/symptoms of physical distress.      Resistance Training   Training Prescription  Yes    Weight  orange bands    Reps  10-15     Time  10 Minutes      Bike   Level  1.3 HR increased with level will decresce back to 1.0    Minutes  17      NuStep   Level  5    Minutes  17    METs  3.2      Track   Laps  17    Minutes  17       Nutrition:  Target Goals: Understanding of nutrition guidelines, daily intake of sodium 1500mg , cholesterol 200mg , calories 30% from fat and 7% or less from saturated fats, daily to have 5 or more servings of fruits and vegetables.  Biometrics: Pre Biometrics - 04/05/17 1000      Pre Biometrics   Grip Strength  26 kg        Nutrition Therapy Plan and Nutrition Goals: Nutrition Therapy & Goals - 04/18/17 1421      Nutrition Therapy   Diet  Therapeutic Lifestyle Changes      Personal Nutrition Goals   Nutrition Goal  Identify food quantities necessary to achieve wt loss of  -2# per week to a goal wt loss of 2.7-10.9 kg (6-24 lb) at graduation from pulmonary rehab.      Intervention Plan   Intervention  Prescribe, educate and counsel regarding individualized specific dietary modifications aiming towards targeted core components such as weight, hypertension, lipid management, diabetes, heart failure and other comorbidities.    Expected Outcomes  Short Term Goal: Understand basic principles of dietary content, such as calories, fat, sodium, cholesterol and nutrients.;Long Term Goal: Adherence to prescribed nutrition plan.       Nutrition Discharge: Rate Your Plate Scores:   Nutrition Goals Re-Evaluation:   Nutrition Goals Discharge (Final Nutrition Goals Re-Evaluation):   Psychosocial: Target Goals: Acknowledge presence or absence of significant depression and/or stress, maximize coping skills, provide positive support system. Participant is able to verbalize types and ability to use techniques and skills needed for reducing stress and depression.  Initial Review & Psychosocial Screening: Initial Psych Review & Screening - 04/05/17 1011      Initial Review    Current issues with  None Identified      Family Dynamics   Good Support System?  Yes      Barriers   Psychosocial barriers to participate in program  There are no identifiable barriers or psychosocial needs.      Screening Interventions   Interventions  Encouraged to exercise       Quality of Life Scores:   PHQ-9: Recent Review Flowsheet Data    Depression screen [facility] 2/9 04/05/2017   Decreased Interest 0   Down, Depressed, Hopeless 0   PHQ - 2 Score 0     Interpretation of Total Score  Total Score Depression Severity:  1-4 = Minimal depression, 5-9 = Mild depression, 10-14 = Moderate depression, 15-19 = Moderately severe depression, 20-27 = Severe depression   Psychosocial Evaluation and Intervention: Psychosocial Evaluation - 04/05/17 1014      Psychosocial Evaluation & Interventions   Interventions  Encouraged to exercise with the program and follow exercise prescription    Expected Outcomes  patient will remain free from  psychosocial barriers to participation    Continue Psychosocial Services   No Follow up required       Psychosocial Re-Evaluation: Psychosocial Re-Evaluation    Row Name 04/26/17 1159 05/20/17 1201 06/10/17 1810         Psychosocial Re-Evaluation   Current issues with  None Identified  None Identified  None Identified     Expected Outcomes  patient will remain free from psychosocial barriers to participation in pulmonary rehab  patient will remain free from psychosocial barriers to participation in pulmonary rehab  patient will remain free from psychosocial barriers to participation in pulmonary rehab     Continue Psychosocial Services   No Follow up required  No Follow up required  No Follow up required        Psychosocial Discharge (Final Psychosocial Re-Evaluation): Psychosocial Re-Evaluation - 06/10/17 1810      Psychosocial Re-Evaluation   Current issues with  None Identified    Expected Outcomes  patient will remain free from  psychosocial barriers to participation in pulmonary rehab    Continue Psychosocial Services   No Follow up required       Education: Education Goals: Education classes will be provided on a weekly basis, covering required topics. Participant will state understanding/return demonstration of topics presented.  Learning Barriers/Preferences: Learning Barriers/Preferences - 04/05/17 1009      Learning Barriers/Preferences   Learning Barriers  None    Learning Preferences  Written Material;Verbal Instruction;Group Instruction;Computer/Internet       Education Topics: Risk Factor Reduction:  -Group instruction that is supported by a PowerPoint presentation. Instructor discusses the definition of a risk factor, different risk factors for pulmonary disease, and how the heart and lungs work together.     Nutrition for Pulmonary Patient:  -Group instruction provided by PowerPoint slides, verbal discussion, and written materials to support subject matter. The instructor gives an explanation and review of healthy diet recommendations, which includes a discussion on weight management, recommendations for fruit and vegetable consumption, as well as protein, fluid, caffeine, fiber, sodium, sugar, and alcohol. Tips for eating when patients are short of breath are discussed.   PULMONARY REHAB OTHER RESPIRATORY from 05/09/2017 in [facility] CARDIAC REHAB  Date  04/25/17  Educator  edna  Instruction Review Code  2- meets goals/outcomes      Pursed Lip Breathing:  -Group instruction that is supported by demonstration and informational handouts. Instructor discusses the benefits of pursed lip and diaphragmatic breathing and detailed demonstration on how to preform both.     Oxygen Safety:  -Group instruction provided by PowerPoint, verbal discussion, and written material to support subject matter. There is an overview of "What is Oxygen" and "Why do we need it".  Instructor also  reviews how to create a safe environment for oxygen use, the importance of using oxygen as prescribed, and the risks of noncompliance. There is a brief discussion on traveling with oxygen and resources the patient may utilize.   PULMONARY REHAB OTHER RESPIRATORY from 05/09/2017 in [facility] CARDIAC REHAB  Date  05/02/17  Educator  Daniel Nones  Instruction Review Code  2- meets goals/outcomes      Oxygen Equipment:  -Group instruction provided by [employer] Staff utilizing handouts, written materials, and equipment demonstrations.   Signs and Symptoms:  -Group instruction provided by written material and verbal discussion to support subject matter. Warning signs and symptoms of infection, stroke, and heart attack are reviewed and when to call the physician/911 reinforced.  Tips for preventing the spread of infection discussed.   Advanced Directives:  -Group instruction provided by verbal instruction and written material to support subject matter. Instructor reviews Advanced Directive laws and proper instruction for filling out document.   Pulmonary Video:  -Group video education that reviews the importance of medication and oxygen compliance, exercise, good nutrition, pulmonary hygiene, and pursed lip and diaphragmatic breathing for the pulmonary patient.   Exercise for the Pulmonary Patient:  -Group instruction that is supported by a PowerPoint presentation. Instructor discusses benefits of exercise, core components of exercise, frequency, duration, and intensity of an exercise routine, importance of utilizing pulse oximetry during exercise, safety while exercising, and options of places to exercise outside of rehab.     Pulmonary Medications:  -Verbally interactive group education provided by instructor with focus on inhaled medications and proper administration.   PULMONARY REHAB OTHER RESPIRATORY from 05/09/2017 in [facility] CARDIAC REHAB  Date   05/09/17  Educator  Pharm D  Instruction Review Code  2- meets goals/outcomes      Anatomy and Physiology of the Respiratory System and Intimacy:  -Group instruction provided by PowerPoint, verbal discussion, and written material to support subject matter. Instructor reviews respiratory cycle and anatomical components of the respiratory system and their functions. Instructor also reviews differences in obstructive and restrictive respiratory diseases with examples of each. Intimacy, Sex, and Sexuality differences are reviewed with a discussion on how relationships can change when diagnosed with pulmonary disease. Common sexual concerns are reviewed.   MD DAY -A group question and answer session with a medical doctor that allows participants to ask questions that relate to their pulmonary disease state.   PULMONARY REHAB OTHER RESPIRATORY from 05/09/2017 in [facility] CARDIAC REHAB  Date  04/16/17  Educator  Dr. Molli Knock  Instruction Review Code  2- meets goals/outcomes      OTHER EDUCATION -Group or individual verbal, written, or video instructions that support the educational goals of the pulmonary rehab program.   Knowledge Questionnaire Score:   Core Components/Risk Factors/Patient Goals at Admission: Personal Goals and Risk Factors at Admission - 04/05/17 1011      Core Components/Risk Factors/Patient Goals on Admission   Improve shortness of breath with ADL's  Yes    Intervention  Provide education, individualized exercise plan and daily activity instruction to help decrease symptoms of SOB with activities of daily living.    Expected Outcomes  Short Term: Achieves a reduction of symptoms when performing activities of daily living.    Develop more efficient breathing techniques such as purse lipped breathing and diaphragmatic breathing; and practicing self-pacing with activity  Yes    Intervention  Provide education, demonstration and support about specific  breathing techniuqes utilized for more efficient breathing. Include techniques such as pursed lipped breathing, diaphragmatic breathing and self-pacing activity.    Expected Outcomes  Short Term: Participant will be able to demonstrate and use breathing techniques as needed throughout daily activities.    Increase knowledge of respiratory medications and ability to use respiratory devices properly   Yes    Intervention  Provide education and demonstration as needed of appropriate use of medications, inhalers, and oxygen therapy.    Expected Outcomes  Short Term: Achieves understanding of medications use. Understands that oxygen is a medication prescribed by physician. Demonstrates appropriate use of inhaler and oxygen therapy.       Core Components/Risk Factors/Patient Goals Review:  Goals and Risk Factor Review    Row Name  04/26/17 1154 05/20/17 1154 06/10/17 1757         Core Components/Risk Factors/Patient Goals Review   Personal Goals Review  Develop more efficient breathing techniques such as purse lipped breathing and diaphragmatic breathing and practicing self-pacing with activity.;Improve shortness of breath with ADL's;Increase knowledge of respiratory medications and ability to use respiratory devices properly.  Develop more efficient breathing techniques such as purse lipped breathing and diaphragmatic breathing and practicing self-pacing with activity.;Improve shortness of breath with ADL's;Increase knowledge of respiratory medications and ability to use respiratory devices properly.  Develop more efficient breathing techniques such as purse lipped breathing and diaphragmatic breathing and practicing self-pacing with activity.;Improve shortness of breath with ADL's;Increase knowledge of respiratory medications and ability to use respiratory devices properly.     Review  patient has only attended 4 sessions since admission and it is too early to evaluate progress towards goals  patient is  doing well in pulmonary rehab. He has attended an education session with the pharmacist on pulmonary medications and has been observed utilizing PLB without prompting during exercise  patient continues to progress well in pulmonary rehab. He feels his shortness of breath is improving with his ADLs and he is observed utilizing PLB during exertion. Expect to see further progression over the next 30 days.     Expected Outcomes  see admission expected outcomes.  see admission expected outcomes.  see admission expected outcomes.        Core Components/Risk Factors/Patient Goals at Discharge (Final Review):  Goals and Risk Factor Review - 06/10/17 1757      Core Components/Risk Factors/Patient Goals Review   Personal Goals Review  Develop more efficient breathing techniques such as purse lipped breathing and diaphragmatic breathing and practicing self-pacing with activity.;Improve shortness of breath with ADL's;Increase knowledge of respiratory medications and ability to use respiratory devices properly.    Review  patient continues to progress well in pulmonary rehab. He feels his shortness of breath is improving with his ADLs and he is observed utilizing PLB during exertion. Expect to see further progression over the next 30 days.    Expected Outcomes  see admission expected outcomes.       ITP Comments:   Comments: patient has attended 12 sessions since admission

## 2017-06-13 NOTE — Progress Notes (Signed)
Daily Session Note  Patient Details  Name: Patrick Knapp MRN: [id number] Date of Birth: 07-24-1927 Referring Provider:     Pulmonary Rehab Walk Test from 04/09/2017 in [place]  Referring Provider  Dr. Nelda Marseille      Encounter Date: 06/13/2017  Check In: Session Check In - 06/13/17 1057      Check-In   Location  [facility]    Staff Present  Trish Fountain, RN, BSN;Lisa Ysidro Evert, RN;Gertrude Tarbet Leonia Reeves, RN, BSN    Supervising physician immediately available to respond to emergencies  [facility] immediately available    Physician(s)  Dr. Starla Link    Medication changes reported      No    Fall or balance concerns reported     No    Tobacco Cessation  No Change    Warm-up and Cool-down  Performed as group-led instruction    Resistance Training Performed  Yes    VAD Patient?  No      Pain Assessment   Currently in Pain?  No/denies    Multiple Pain Sites  No       Capillary Blood Glucose: No results found for this or any previous visit (from the past 24 hour(s)).  Exercise Prescription Changes - 06/13/17 1200      Response to Exercise   Blood Pressure (Admit)  120/62    Blood Pressure (Exercise)  154/78    Blood Pressure (Exit)  120/60    Heart Rate (Admit)  64 bpm    Heart Rate (Exercise)  97 bpm    Heart Rate (Exit)  71 bpm    Oxygen Saturation (Admit)  97 %    Oxygen Saturation (Exercise)  90 %    Oxygen Saturation (Exit)  95 %    Rating of Perceived Exertion (Exercise)  13    Perceived Dyspnea (Exercise)  2    Duration  Continue with 45 min of aerobic exercise without signs/symptoms of physical distress.    Intensity  THRR unchanged      Progression   Progression  Continue to progress workloads to maintain intensity without signs/symptoms of physical distress.      Resistance Training   Training Prescription  Yes    Weight  orange bands    Reps  10-15    Time  10 Minutes      Bike   Level  1.3 HR increased  with level will decresce back to 1.0    Minutes  17      Track   Laps  16    Minutes  17       Social History   Tobacco Use  Smoking Status Never Smoker  Smokeless Tobacco Never Used    Goals Met:  Exercise tolerated well Strength training completed today  Goals Unmet:  Not Applicable  Comments: Service time is from 1030 to 1230    Dr. Rush Farmer is Medical Director for Pulmonary Rehab at [facility].

## 2017-06-18 ENCOUNTER — Encounter (HOSPITAL_COMMUNITY): Payer: Non-veteran care

## 2017-06-19 DIAGNOSIS — J84112 Idiopathic pulmonary fibrosis: Secondary | ICD-10-CM | POA: Diagnosis present

## 2017-06-20 ENCOUNTER — Encounter (HOSPITAL_COMMUNITY)
Admission: RE | Admit: 2017-06-20 | Discharge: 2017-06-20 | Disposition: A | Discharge status: Home / Self Care | Payer: Non-veteran care | Source: Ambulatory Visit | Attending: Pulmonary Disease | Admitting: Pulmonary Disease

## 2017-06-20 VITALS — Wt 196.0 lb

## 2017-06-20 DIAGNOSIS — J84112 Idiopathic pulmonary fibrosis: Secondary | ICD-10-CM | POA: Diagnosis not present

## 2017-06-20 DIAGNOSIS — J8410 Pulmonary fibrosis, unspecified: Secondary | ICD-10-CM

## 2017-06-20 NOTE — Progress Notes (Signed)
Daily Session Note  Patient Details  Name: Patrick Knapp MRN: [id number] Date of Birth: 1928/06/18 Referring Provider:     Pulmonary Rehab Walk Test from 04/09/2017 in [place]  Referring Provider  Dr. Nelda Marseille      Encounter Date: 06/20/2017  Check In: Session Check In - 06/20/17 1030      Check-In   Location  [facility]    Staff Present  Rosebud Poles, RN, BSN;Molly diVincenzo, MS, ACSM RCEP, Exercise Physiologist;Jahmia Berrett Rollene Rotunda, RN, BSN    Supervising physician immediately available to respond to emergencies  [facility] immediately available    Physician(s)  Dr. Starla Link    Medication changes reported      No    Fall or balance concerns reported     No    Tobacco Cessation  No Change    Warm-up and Cool-down  Performed as group-led instruction    Resistance Training Performed  Yes    VAD Patient?  No      Pain Assessment   Currently in Pain?  No/denies    Multiple Pain Sites  No       Capillary Blood Glucose: No results found for this or any previous visit (from the past 24 hour(s)).  Exercise Prescription Changes - 06/20/17 1229      Response to Exercise   Blood Pressure (Admit)  118/62    Blood Pressure (Exercise)  130/52    Blood Pressure (Exit)  110/58    Heart Rate (Admit)  50 bpm    Heart Rate (Exercise)  88 bpm    Heart Rate (Exit)  65 bpm    Oxygen Saturation (Admit)  97 %    Oxygen Saturation (Exercise)  91 %    Oxygen Saturation (Exit)  97 %    Rating of Perceived Exertion (Exercise)  13    Perceived Dyspnea (Exercise)  2    Duration  Continue with 45 min of aerobic exercise without signs/symptoms of physical distress.    Intensity  THRR unchanged      Progression   Progression  Continue to progress workloads to maintain intensity without signs/symptoms of physical distress.      Resistance Training   Training Prescription  Yes    Weight  orange bands    Reps  10-15    Time  10 Minutes       Bike   Level  1.3 HR increased with level will decresce back to 1.0    Minutes  17      NuStep   Level  5    Minutes  17    METs  3.4      Track   Laps  16    Minutes  17       Social History   Tobacco Use  Smoking Status Never Smoker  Smokeless Tobacco Never Used    Goals Met:  Independence with exercise equipment Improved SOB with ADL's Using PLB without cueing & demonstrates good technique Exercise tolerated well No report of cardiac concerns or symptoms Strength training completed today  Goals Unmet:  Not Applicable  Comments: Service time is from 1030 to 1205   Dr. Rush Farmer is Medical Director for Pulmonary Rehab at [facility].

## 2017-06-24 ENCOUNTER — Ambulatory Visit: Payer: Medicare HMO | Admitting: Pulmonary Disease

## 2017-06-24 ENCOUNTER — Encounter: Payer: Self-pay | Admitting: Pulmonary Disease

## 2017-06-24 VITALS — BP 110/60 | HR 50 | Ht 68.5 in | Wt 196.4 lb

## 2017-06-24 DIAGNOSIS — J84112 Idiopathic pulmonary fibrosis: Secondary | ICD-10-CM | POA: Diagnosis not present

## 2017-06-24 NOTE — Progress Notes (Signed)
   Subjective:    Patient ID: Patrick Knapp, male    DOB: 03-21-1928, 83 y.o.   MRN: [id number]  HPI  83 yo male never smoker For follow-up of pulmonary fibrosis. First seen 12/2016.  He denies cough, wheezing, shortness of breath is at its baseline.  He is doing well with pulmonary rehab.   He is several questions about risks and benefits of therapy which we discussed today.    Significant tests/ events reviewed  CT chest 2011 showed diffuse right greater than left peripheral and subpleural reticular fibrosis. Ambulatory O2 saturation on room air without desaturation 12/28/2016 ANA , CCP neg  HRCT fibrotic interstitial lung disease with mild honeycombing. Slight basilar predominance and slight asymmetrical involvement of the right lung with some progression since 2011.  PFTs 01/2017 no obs, FEV1 96%, FVC 76%       Past Medical History:  Diagnosis Date  . Arthritis    "all the joints" (02/06/2016)  . Coronary artery disease    a. PCI with DES to mid LAD 7/17 b.post PTCA and stenting of his left circumflex artery in 12/08  . Enlarged prostate   . GERD (gastroesophageal reflux disease)   . Hyperlipidemia   . Hypertension   . Ischemia   . Shortness of breath   . Skin disorder    Occurs at times as rash, whelps, blister-like areas  with itching sometimes-has occurred for years , but can not find an exact dx or cause    Review of Systems neg for any significant sore throat, dysphagia, itching, sneezing, nasal congestion or excess/ purulent secretions, fever, chills, sweats, unintended wt loss, pleuritic or exertional cp, hempoptysis, orthopnea pnd or change in chronic leg swelling.   Also denies presyncope, palpitations, heartburn, abdominal pain, nausea, vomiting, diarrhea or change in bowel or urinary habits, dysuria,hematuria, rash, arthralgias, visual complaints, headache, numbness weakness or ataxia.     Objective:   Physical  Exam  Gen. Pleasant, well-nourished, in no distress ENT - no thrush, no post nasal drip Neck: No JVD, no thyromegaly, no carotid bruits Lungs: no use of accessory muscles, no dullness to percussion, bibasilar crackles 1/3, no rhonchi  Cardiovascular: Rhythm regular, heart sounds  normal, no murmurs or gallops, no peripheral edema Musculoskeletal: No deformities, no cyanosis or

## 2017-06-24 NOTE — Patient Instructions (Addendum)
Follow up in 3 months. We will repeat spirometry at that time.   Please call use with any questions or concerns.

## 2017-06-24 NOTE — Assessment & Plan Note (Addendum)
Last PFTs from 01/2017 with no obstruction, mild restriction (FVC 75%). Symptoms are stable. Reports he is doing well with pulmonary rehab. Spirometry today with FVC of 68%. Discussed risks and benefits of starting nintedanib therapy. Patient wishes to hold off on therapy for now. Will continue with pulmonary rehab and follow up 3 months for repeat spirometry.

## 2017-06-25 ENCOUNTER — Encounter (HOSPITAL_COMMUNITY)
Admission: RE | Admit: 2017-06-25 | Discharge: 2017-06-25 | Disposition: A | Discharge status: Home / Self Care | Payer: Non-veteran care | Source: Ambulatory Visit | Attending: Pulmonary Disease | Admitting: Pulmonary Disease

## 2017-06-25 VITALS — Wt 197.1 lb

## 2017-06-25 DIAGNOSIS — J84112 Idiopathic pulmonary fibrosis: Secondary | ICD-10-CM | POA: Diagnosis not present

## 2017-06-25 DIAGNOSIS — J8410 Pulmonary fibrosis, unspecified: Secondary | ICD-10-CM

## 2017-06-25 NOTE — Progress Notes (Signed)
Daily Session Note  Patient Details  Name: Patrick Knapp MRN: [id number] Date of Birth: [birth year] Referring Provider:     Pulmonary Rehab Walk Test from 04/09/2017 in [place]  Referring Provider  Dr. Nelda Marseille      Encounter Date: 06/25/2017  Check In: Session Check In - 06/25/17 1030      Check-In   Location  [facility]    Staff Present  Rosebud Poles, RN, BSN;Molly diVincenzo, MS, ACSM RCEP, Exercise Physiologist;Portia Rollene Rotunda, RN, Roque Cash, RN    Supervising physician immediately available to respond to emergencies  [facility] immediately available    Physician(s)  Dr. Eliseo Squires    Medication changes reported      No    Fall or balance concerns reported     No    Tobacco Cessation  No Change    Warm-up and Cool-down  Performed as group-led instruction    Resistance Training Performed  Yes    VAD Patient?  No      Pain Assessment   Currently in Pain?  No/denies    Multiple Pain Sites  No       Capillary Blood Glucose: No results found for this or any previous visit (from the past 24 hour(s)).  Exercise Prescription Changes - 06/25/17 1200      Response to Exercise   Blood Pressure (Admit)  110/56    Blood Pressure (Exercise)  160/70    Blood Pressure (Exit)  122/70    Heart Rate (Admit)  73 bpm    Heart Rate (Exercise)  118 bpm    Heart Rate (Exit)  86 bpm    Oxygen Saturation (Admit)  97 %    Oxygen Saturation (Exercise)  90 %    Oxygen Saturation (Exit)  94 %    Rating of Perceived Exertion (Exercise)  11    Perceived Dyspnea (Exercise)  2    Duration  Continue with 45 min of aerobic exercise without signs/symptoms of physical distress.    Intensity  THRR unchanged      Progression   Progression  Continue to progress workloads to maintain intensity without signs/symptoms of physical distress.      Resistance Training   Training Prescription  Yes    Weight  orange bands    Reps  10-15    Time  10 Minutes      Bike   Level  1.3    Minutes  17      NuStep   Level  5    Minutes  17      Track   Laps  16    Minutes  17       Social History   Tobacco Use  Smoking Status Never Smoker  Smokeless Tobacco Never Used    Goals Met:  Exercise tolerated well Strength training completed today  Goals Unmet:  Not Applicable  Comments: Service time is from 1030 to 1215    Dr. Rush Farmer is Medical Director for Pulmonary Rehab at [facility].

## 2017-06-27 ENCOUNTER — Encounter (HOSPITAL_COMMUNITY)
Admission: RE | Admit: 2017-06-27 | Discharge: 2017-06-27 | Disposition: A | Discharge status: Home / Self Care | Payer: Non-veteran care | Source: Ambulatory Visit | Attending: Pulmonary Disease | Admitting: Pulmonary Disease

## 2017-06-27 VITALS — Wt 196.2 lb

## 2017-06-27 DIAGNOSIS — J84112 Idiopathic pulmonary fibrosis: Secondary | ICD-10-CM | POA: Diagnosis not present

## 2017-06-27 DIAGNOSIS — J8410 Pulmonary fibrosis, unspecified: Secondary | ICD-10-CM

## 2017-06-27 NOTE — Progress Notes (Signed)
Daily Session Note  Patient Details  Name: Patrick Knapp MRN: [id number] Date of Birth: 1928-01-06 Referring Provider:     Pulmonary Rehab Walk Test from 04/09/2017 in [place]  Referring Provider  Dr. Nelda Marseille      Encounter Date: 06/27/2017  Check In: Session Check In - 06/27/17 1116      Check-In   Location  [facility]    Staff Present  Su Hilt, MS, ACSM RCEP, Exercise Physiologist;Portia Rollene Rotunda, RN, Roque Cash, RN    Supervising physician immediately available to respond to emergencies  [facility] immediately available    Physician(s)  Dr. Wendee Beavers    Medication changes reported      No    Fall or balance concerns reported     No    Tobacco Cessation  No Change    Warm-up and Cool-down  Performed as group-led instruction    Resistance Training Performed  Yes    VAD Patient?  No      Pain Assessment   Currently in Pain?  No/denies    Multiple Pain Sites  No       Capillary Blood Glucose: No results found for this or any previous visit (from the past 24 hour(s)).  Exercise Prescription Changes - 06/27/17 1300      Response to Exercise   Blood Pressure (Admit)  114/70    Blood Pressure (Exercise)  176/72    Blood Pressure (Exit)  124/72    Heart Rate (Admit)  54 bpm    Heart Rate (Exercise)  88 bpm    Heart Rate (Exit)  62 bpm    Oxygen Saturation (Admit)  97 %    Oxygen Saturation (Exercise)  91 %    Oxygen Saturation (Exit)  95 %    Rating of Perceived Exertion (Exercise)  11    Perceived Dyspnea (Exercise)  2    Duration  Continue with 45 min of aerobic exercise without signs/symptoms of physical distress.    Intensity  THRR unchanged      Progression   Progression  Continue to progress workloads to maintain intensity without signs/symptoms of physical distress.      Resistance Training   Training Prescription  Yes    Weight  orange bands    Reps  10-15    Time  10 Minutes      Bike   Level  1.3    Minutes  17      NuStep   Level  5    Minutes  17    METs  3.7       Social History   Tobacco Use  Smoking Status Never Smoker  Smokeless Tobacco Never Used    Goals Met:  Exercise tolerated well No report of cardiac concerns or symptoms Strength training completed today  Goals Unmet:  Not Applicable  Comments: Service time is from 1030 to 1245    Dr. Rush Farmer is Medical Director for Pulmonary Rehab at [facility].

## 2017-07-04 ENCOUNTER — Encounter (HOSPITAL_COMMUNITY)
Admission: RE | Admit: 2017-07-04 | Discharge: 2017-07-04 | Disposition: A | Discharge status: Home / Self Care | Payer: Non-veteran care | Source: Ambulatory Visit | Attending: Pulmonary Disease | Admitting: Pulmonary Disease

## 2017-07-04 VITALS — Wt 199.1 lb

## 2017-07-04 DIAGNOSIS — J8410 Pulmonary fibrosis, unspecified: Secondary | ICD-10-CM

## 2017-07-04 DIAGNOSIS — J84112 Idiopathic pulmonary fibrosis: Secondary | ICD-10-CM | POA: Diagnosis not present

## 2017-07-04 NOTE — Progress Notes (Signed)
Daily Session Note  Patient Details  Name: Patrick Knapp MRN: [id number] Date of Birth: [birth year] Referring Provider:     Pulmonary Rehab Walk Test from 04/09/2017 in [place]  Referring Provider  Dr. Nelda Marseille      Encounter Date: 07/04/2017  Check In: Session Check In - 07/04/17 1030      Check-In   Location  [facility]    Staff Present  Rosebud Poles, RN, BSN;Ramon Dredge, RN, MHA;[place] Frieden Rollene Rotunda, RN, Roque Cash, RN    Supervising physician immediately available to respond to emergencies  [facility] immediately available    Physician(s)  Dr. Rockne Menghini    Medication changes reported      No    Fall or balance concerns reported     No    Tobacco Cessation  No Change    Warm-up and Cool-down  Performed as group-led instruction    Resistance Training Performed  Yes    VAD Patient?  No      Pain Assessment   Currently in Pain?  No/denies    Multiple Pain Sites  No       Capillary Blood Glucose: No results found for this or any previous visit (from the past 24 hour(s)).  Exercise Prescription Changes - 07/04/17 1222      Response to Exercise   Blood Pressure (Admit)  132/60    Blood Pressure (Exercise)  170/80    Blood Pressure (Exit)  116/64    Heart Rate (Admit)  60 bpm    Heart Rate (Exercise)  84 bpm    Heart Rate (Exit)  57 bpm    Oxygen Saturation (Admit)  97 %    Oxygen Saturation (Exercise)  92 %    Oxygen Saturation (Exit)  97 %    Rating of Perceived Exertion (Exercise)  12    Perceived Dyspnea (Exercise)  2    Duration  Continue with 45 min of aerobic exercise without signs/symptoms of physical distress.    Intensity  THRR unchanged      Progression   Progression  Continue to progress workloads to maintain intensity without signs/symptoms of physical distress.      Resistance Training   Training Prescription  Yes    Weight  orange bands    Reps  10-15    Time  10 Minutes      Bike    Level  1.3    Minutes  17      NuStep   Level  5    Minutes  17    METs  3.8       Social History   Tobacco Use  Smoking Status Never Smoker  Smokeless Tobacco Never Used    Goals Met:  Independence with exercise equipment Improved SOB with ADL's Using PLB without cueing & demonstrates good technique Exercise tolerated well No report of cardiac concerns or symptoms Strength training completed today  Goals Unmet:  Not Applicable  Comments: Service time is from 1030 to 1215   Dr. Rush Farmer is Medical Director for Pulmonary Rehab at [facility].

## 2017-07-04 NOTE — Progress Notes (Signed)
Patrick Knapp 83 y.o. male  DOB: Jun 09, 1928 MRN: [id number]           Nutrition Note 1. Pulmonary fibrosis ([place])    Note Spoke with pt. Pt is obese. Pt eats 3 meals a day; most prepared at home.  Making healthy food choices the majority of the time.  Pt's Rate Your Plate results reviewed with pt. Age-appropriate nutrition recommendations discussed with pt. Pt expressed understanding of the information reviewed via feedback method.    Nutrition Diagnosis ? Food-and nutrition-related knowledge deficit related to lack of exposure to information as related to diagnosis of pulmonary disease ? Obesity related to excessive energy intake as evidenced by a BMI of 31.1  Nutrition Intervention ? Pt's individual nutrition plan and goals reviewed with pt. ? Benefits of adopting healthy eating habits discussed when pt's Rate Your Plate reviewed.  Goal(s) 1. Identify food quantities necessary to achieve wt loss of  -2# per week to a goal wt loss of 2.7-10.9 kg (6-24 lb) at graduation from pulmonary rehab.  Plan:  Pt to attend Pulmonary Nutrition class - met; 04/25/17 Will provide client-centered nutrition education as part of interdisciplinary care.   Monitor and evaluate progress toward nutrition goal with team.  Monitor and Evaluate progress toward nutrition goal with team.   Derek Mound, M.Ed, RD, LDN, CDE 07/04/2017 11:59 AM

## 2017-07-04 NOTE — Progress Notes (Signed)
Pulmonary Individual Treatment Plan  Patient Details  Name: Patrick Knapp MRN: [id number] Date of Birth: 1928-01-28 Referring Provider:     [facility]hab [facility]  Referring Provider  Dr. Molli KnockYacoub      Initial Encounter Date:    Pulmonary Rehab Walk [facility]2017 [facility]  Date  04/09/17  Referring Provider  Dr. Molli KnockYacoub      Visit Diagnosis: Pulmonary fibrosis (HCC)  Patient's Home Medications on Admission:   Current Outpatient Medications:  .  acetaminophen (TYLENOL) 325 MG tablet, Take 2 tablets (650 mg total) by mouth every 6 (six) hours as needed for mild pain (or Fever >/= 101)., Disp: 40 tablet, Rfl: 0 .  ALPRAZolam (XANAX) 0.5 MG tablet, Take 0.5 mg by mouth 3 (three) times daily as needed. AS NEEDED FOR ANXIETY, Disp: , Rfl:  .  amLODipine (NORVASC) 2.5 MG tablet, Take 1 tablet (2.5 mg total) by mouth daily., Disp: 90 tablet, Rfl: 3 .  atorvastatin (LIPITOR) 20 MG tablet, Take 20 mg by mouth daily. , Disp: , Rfl:  .  cholecalciferol (VITAMIN D) 1000 units tablet, Take 1,000 Units by mouth daily., Disp: , Rfl:  .  clopidogrel (PLAVIX) 75 MG tablet, Take 1 tablet (75 mg total) by mouth daily., Disp: 30 tablet, Rfl: 11 .  doxazosin (CARDURA) 4 MG tablet, Take 2 mg by mouth every morning. , Disp: , Rfl:  .  isosorbide mononitrate (IMDUR) 30 MG 24 hr tablet, TAKE 1 TABLET (30 MG TOTAL) BY MOUTH DAILY., Disp: 90 tablet, Rfl: 1 .  magnesium gluconate (MAGONATE) 500 MG tablet, Take 500 mg by mouth daily., Disp: , Rfl:  .  metoprolol tartrate (LOPRESSOR) 50 MG tablet, Take 0.5 tablets (25 mg total) by mouth 2 (two) times daily., Disp: 90 tablet, Rfl: 1 .  nitroGLYCERIN (NITROSTAT) 0.4 MG SL tablet, Place 1 tablet (0.4 mg total) under the tongue every 5 (five) minutes as needed for chest pain., Disp: 25 tablet, Rfl: 3  Past Medical History: Past Medical History:  Diagnosis  Date  . Arthritis    "all the joints" (02/06/2016)  . Coronary artery disease    a. PCI with DES to mid LAD 7/17 b.post PTCA and stenting of his left circumflex artery in 12/08  . Enlarged prostate   . GERD (gastroesophageal reflux disease)   . Hyperlipidemia   . Hypertension   . Ischemia   . Pulmonary fibrosis, unspecified (HCC)   . Shortness of breath   . Skin disorder    Occurs at times as rash, whelps, blister-like areas  with itching sometimes-has occurred for years , but can not find an exact dx or cause    Tobacco Use: Social History   Tobacco Use  Smoking Status Never Smoker  Smokeless Tobacco Never Used    Labs: Recent Review Flowsheet Data    Labs for ITP Cardiac and Pulmonary Rehab Latest Ref Rng & Units 06/14/2007 01/05/2011 03/17/2014 02/06/2016 02/06/2016   Cholestrol 0 - 200 mg/dL - 409122 811115 - -   LDLCALC 0 - 99 mg/dL - 53 49 - -   HDL >91.47>39.00 mg/dL - 82.9544.50 62.13(Y37.20(L) - -   Trlycerides 0.0 - 149.0 mg/dL - 865.7124.0 846.9142.0 - -   PHART 7.350 - 7.450 - - - - 7.336(L)   PCO2ART 35.0 - 45.0 mmHg - - - - 44.4   HCO3 20.0 - 24.0 mEq/L 23.3 - - 23.2 23.8  TCO2 0 - 100 mmol/L 24 - - 25 25   ACIDBASEDEF 0.0 - 2.0 mmol/L 2.0 - - 3.0(H) 2.0   O2SAT % - - - 72.0 97.0      Capillary Blood Glucose: No results found for: GLUCAP   Pulmonary Assessment Scores: Pulmonary Assessment Scores    Row Name 04/11/17 0704         ADL UCSD   ADL Phase  Entry       mMRC Score   mMRC Score  1        Pulmonary Function Assessment: Pulmonary Function Assessment - 04/05/17 1010      Breath   Bilateral Breath Sounds  Other    Other  course crackles in bases bilaterally    Shortness of Breath  Yes;Limiting activity       Exercise Target Goals:    Exercise Program Goal: Individual exercise prescription set with THRR, safety & activity barriers. Participant demonstrates ability to understand and report RPE using BORG scale, to self-measure pulse accurately, and to acknowledge the  importance of the exercise prescription.  Exercise Prescription Goal: Starting with aerobic activity 30 plus minutes a day, 3 days per week for initial exercise prescription. Provide home exercise prescription and guidelines that participant acknowledges understanding prior to discharge.  Activity Barriers & Risk Stratification: Activity Barriers & Cardiac Risk Stratification - 04/05/17 0956      Activity Barriers & Cardiac Risk Stratification   Activity Barriers  Shortness of Breath;Arthritis       6 Minute Walk: 6 Minute Walk    Row Name 04/11/17 0700         6 Minute Walk   Phase  Initial     Distance  1540 feet     Walk Time  6 minutes     # of Rest Breaks  0     MPH  2.91     METS  3.22     RPE  13     Perceived Dyspnea   3     Symptoms  No     Resting HR  72 bpm     Resting BP  165/73     Resting Oxygen Saturation   95 %     Exercise Oxygen Saturation  during 6 min walk  90 %     Max Ex. HR  121 bpm     Max Ex. BP  220/78     2 Minute Post BP  175/77       Interval HR   1 Minute HR  97     2 Minute HR  100     3 Minute HR  107     4 Minute HR  121     5 Minute HR  114     6 Minute HR  119     2 Minute Post HR  119     Interval Heart Rate?  Yes       Interval Oxygen   Interval Oxygen?  Yes     Baseline Oxygen Saturation %  95 %     1 Minute Oxygen Saturation %  94 %     1 Minute Liters of Oxygen  0 L     2 Minute Oxygen Saturation %  91 %     2 Minute Liters of Oxygen  0 L     3 Minute Oxygen Saturation %  91 %     3 Minute Liters of Oxygen  0 L     4 Minute Oxygen Saturation %  90 %     4 Minute Liters of Oxygen  0 L     5 Minute Oxygen Saturation %  91 %     5 Minute Liters of Oxygen  0 L     6 Minute Oxygen Saturation %  90 %     6 Minute Liters of Oxygen  0 L     2 Minute Post Oxygen Saturation %  91 %     2 Minute Post Liters of Oxygen  0 L        Oxygen Initial Assessment: Oxygen Initial Assessment - 04/11/17 0704      Initial 6 min  Walk   Oxygen Used  None      Program Oxygen Prescription   Program Oxygen Prescription  None       Oxygen Re-Evaluation: Oxygen Re-Evaluation    Row Name 04/26/17 1152 05/20/17 1154 06/10/17 1757 07/04/17 0645       Program Oxygen Prescription   Program Oxygen Prescription  None  None  None  None      Home Oxygen   Home Oxygen Device  None  None  None  None    Sleep Oxygen Prescription  None  None  None  None    Home Exercise Oxygen Prescription  None  None  None  None    Home at Rest Exercise Oxygen Prescription  None  None  None  None       Oxygen Discharge (Final Oxygen Re-Evaluation): Oxygen Re-Evaluation - 07/04/17 0645      Program Oxygen Prescription   Program Oxygen Prescription  None      Home Oxygen   Home Oxygen Device  None    Sleep Oxygen Prescription  None    Home Exercise Oxygen Prescription  None    Home at Rest Exercise Oxygen Prescription  None       Initial Exercise Prescription: Initial Exercise Prescription - 04/11/17 0700      Date of Initial Exercise RX and Referring Provider   Date  04/09/17    Referring Provider  Dr. Molli Knock      Bike   Level  0.4    Minutes  17      NuStep   Level  2    Minutes  17    METs  1.5      Track   Laps  10    Minutes  17      Prescription Details   Frequency (times per week)  2      Intensity   THRR 40-80% of Max Heartrate  54-107    Ratings of Perceived Exertion  11-13    Perceived Dyspnea  0-4      Progression   Progression  Continue progressive overload as per policy without signs/symptoms or physical distress.      Resistance Training   Training Prescription  Yes    Weight  orange bands    Reps  10-15       Perform Capillary Blood Glucose checks as needed.  Exercise Prescription Changes: Exercise Prescription Changes    Row Name 04/16/17 1300 04/18/17 1200 04/23/17 1200 04/25/17 1200 04/30/17 1200     Response to Exercise   Blood Pressure (Admit)  120/62  118/56  100/52  124/52   136/64   Blood Pressure (Exercise)  170/64  104/50  152/64  156/60  160/70   Blood Pressure (Exit)  120/60  100/50  104/62  118/70  126/66   Heart Rate (Admit)  63 bpm  47 bpm  52 bpm  56 bpm  59 bpm   Heart Rate (Exercise)  83 bpm  71 bpm  71 bpm  76 bpm  80 bpm   Heart Rate (Exit)  71 bpm  53 bpm  53 bpm  58 bpm  66 bpm   Oxygen Saturation (Admit)  97 %  98 %  96 %  95 %  96 %   Oxygen Saturation (Exercise)  95 %  91 %  91 %  92 %  93 %   Oxygen Saturation (Exit)  97 %  97 %  97 %  97 %  96 %   Rating of Perceived Exertion (Exercise)  13  15  12  13  13    Perceived Dyspnea (Exercise)  2  1  2  3  2    Duration  Continue with 45 min of aerobic exercise without signs/symptoms of physical distress.  Continue with 45 min of aerobic exercise without signs/symptoms of physical distress.  Continue with 45 min of aerobic exercise without signs/symptoms of physical distress.  Continue with 45 min of aerobic exercise without signs/symptoms of physical distress.  Continue with 45 min of aerobic exercise without signs/symptoms of physical distress.   Intensity  THRR unchanged  THRR unchanged  THRR unchanged  THRR unchanged  THRR unchanged     Progression   Progression  Continue to progress workloads to maintain intensity without signs/symptoms of physical distress.  Continue to progress workloads to maintain intensity without signs/symptoms of physical distress.  Continue to progress workloads to maintain intensity without signs/symptoms of physical distress.  Continue to progress workloads to maintain intensity without signs/symptoms of physical distress.  Continue to progress workloads to maintain intensity without signs/symptoms of physical distress.     Resistance Training   Training Prescription  Yes  Yes  Yes  Yes  Yes   Weight  orange bands  orange bands  orange bands  orange bands  orange bands   Reps  10-15  10-15  10-15  10-15  10-15     Bike   Level  0.6  0.6  0.8  0.8  0.8   Minutes  17  17   17  17  17      NuStep   Level  -  2  3  -  3   Minutes  -  17  17  -  17   METs  -  2.1  2.4  -  2.7     Track   Laps  12  14  12  16  13    Minutes  17  17  17  17  17    Row Name 05/02/17 1200 05/07/17 1200 05/09/17 1246 05/14/17 1200 05/16/17 1327     Response to Exercise   Blood Pressure (Admit)  120/60  118/56  104/50  106/60  118/54   Blood Pressure (Exercise)  158/64  130/60  160/58  150/80  154/70   Blood Pressure (Exit)  110/70  104/68  116/66  116/68  138/62   Heart Rate (Admit)  52 bpm  59 bpm  57 bpm  65 bpm  59 bpm   Heart Rate (Exercise)  90 bpm  88 bpm  81 bpm  82 bpm  118 bpm   Heart Rate (Exit)  69 bpm  63 bpm  57 bpm  60  bpm  80 bpm   Oxygen Saturation (Admit)  96 %  96 %  94 %  98 %  95 %   Oxygen Saturation (Exercise)  94 %  92 %  90 %  90 %  90 %   Oxygen Saturation (Exit)  98 %  96 %  96 %  97 %  93 %   Rating of Perceived Exertion (Exercise)  12  13  13  13  13    Perceived Dyspnea (Exercise)  1  2  2  2  2    Duration  Continue with 45 min of aerobic exercise without signs/symptoms of physical distress.  Continue with 45 min of aerobic exercise without signs/symptoms of physical distress.  Continue with 45 min of aerobic exercise without signs/symptoms of physical distress.  Continue with 45 min of aerobic exercise without signs/symptoms of physical distress.  Continue with 45 min of aerobic exercise without signs/symptoms of physical distress.   Intensity  THRR unchanged  THRR unchanged  THRR unchanged  THRR unchanged  THRR unchanged     Progression   Progression  Continue to progress workloads to maintain intensity without signs/symptoms of physical distress.  Continue to progress workloads to maintain intensity without signs/symptoms of physical distress.  Continue to progress workloads to maintain intensity without signs/symptoms of physical distress.  Continue to progress workloads to maintain intensity without signs/symptoms of physical distress.  Continue to  progress workloads to maintain intensity without signs/symptoms of physical distress.     Resistance Training   Training Prescription  Yes  Yes  Yes  Yes  Yes   Weight  orange bands  orange bands  orange bands  orange bands  orange bands   Reps  10-15  10-15  10-15  10-15  10-15   Time  10 Minutes  10 Minutes  10 Minutes  10 Minutes  10 Minutes     Bike   Level  1  1  1   1.3  -   Minutes  17  17  17  17   -     NuStep   Level  4  4  -  5  5   Minutes  17  17  -  17  17   METs  3.2  3  -  3.9  3.5     Track   Laps  -  13  15  16  17    Minutes  -  17  17  17  17    Row Name 05/28/17 1211 06/04/17 1200 06/13/17 1200 06/20/17 1229 06/25/17 1200     Response to Exercise   Blood Pressure (Admit)  110/50  138/70  120/62  118/62  110/56   Blood Pressure (Exercise)  152/64  130/50  154/78  130/52  160/70   Blood Pressure (Exit)  106/62  118/60  120/60  110/58  122/70   Heart Rate (Admit)  51 bpm  76 bpm  64 bpm  50 bpm  73 bpm   Heart Rate (Exercise)  86 bpm  122 bpm  97 bpm  88 bpm  118 bpm   Heart Rate (Exit)  61 bpm  79 bpm  71 bpm  65 bpm  86 bpm   Oxygen Saturation (Admit)  97 %  95 %  97 %  97 %  97 %   Oxygen Saturation (Exercise)  89 %  93 %  90 %  91 %  90 %   Oxygen  Saturation (Exit)  96 %  96 %  95 %  97 %  94 %   Rating of Perceived Exertion (Exercise)  12  13  13  13  11    Perceived Dyspnea (Exercise)  1  3  2  2  2    Duration  Continue with 45 min of aerobic exercise without signs/symptoms of physical distress.  Continue with 45 min of aerobic exercise without signs/symptoms of physical distress.  Continue with 45 min of aerobic exercise without signs/symptoms of physical distress.  Continue with 45 min of aerobic exercise without signs/symptoms of physical distress.  Continue with 45 min of aerobic exercise without signs/symptoms of physical distress.   Intensity  THRR unchanged  THRR unchanged  THRR unchanged  THRR unchanged  THRR unchanged     Progression   Progression   Continue to progress workloads to maintain intensity without signs/symptoms of physical distress.  Continue to progress workloads to maintain intensity without signs/symptoms of physical distress.  Continue to progress workloads to maintain intensity without signs/symptoms of physical distress.  Continue to progress workloads to maintain intensity without signs/symptoms of physical distress.  Continue to progress workloads to maintain intensity without signs/symptoms of physical distress.     Resistance Training   Training Prescription  Yes  Yes  Yes  Yes  Yes   Weight  orange bands  orange bands  orange bands  orange bands  orange bands   Reps  10-15  10-15  10-15  10-15  10-15   Time  10 Minutes  10 Minutes  10 Minutes  10 Minutes  10 Minutes     Bike   Level  1.3  1.3 HR increased with level will decresce back to 1.0  1.3 HR increased with level will decresce back to 1.0  1.3 HR increased with level will decresce back to 1.0  1.3   Minutes  17  17  17  17  17      NuStep   Level  5  5  -  5  5   Minutes  17  17  -  17  17   METs  -  3.2  -  3.4  -     Track   Laps  15  17  16  16  16    Minutes  17  17  17  17  17    Row Name 06/27/17 1300             Response to Exercise   Blood Pressure (Admit)  114/70       Blood Pressure (Exercise)  176/72       Blood Pressure (Exit)  124/72       Heart Rate (Admit)  54 bpm       Heart Rate (Exercise)  88 bpm       Heart Rate (Exit)  62 bpm       Oxygen Saturation (Admit)  97 %       Oxygen Saturation (Exercise)  91 %       Oxygen Saturation (Exit)  95 %       Rating of Perceived Exertion (Exercise)  11       Perceived Dyspnea (Exercise)  2       Duration  Continue with 45 min of aerobic exercise without signs/symptoms of physical distress.       Intensity  THRR unchanged         Progression   Progression  Continue to progress workloads to maintain intensity without signs/symptoms of physical distress.         Resistance Training    Training Prescription  Yes       Weight  orange bands       Reps  10-15       Time  10 Minutes         Bike   Level  1.3       Minutes  17         NuStep   Level  5       Minutes  17       METs  3.7          Exercise Comments:   Exercise Goals and Review: Exercise Goals    Row Name 04/05/17 0958             Exercise Goals   Increase Physical Activity  Yes       Intervention  Provide advice, education, support and counseling about physical activity/exercise needs.;Develop an individualized exercise prescription for aerobic and resistive training based on initial evaluation findings, risk stratification, comorbidities and participant's personal goals.       Expected Outcomes  Achievement of increased cardiorespiratory fitness and enhanced flexibility, muscular endurance and strength shown through measurements of functional capacity and personal statement of participant.       Increase Strength and Stamina  Yes       Intervention  Provide advice, education, support and counseling about physical activity/exercise needs.;Develop an individualized exercise prescription for aerobic and resistive training based on initial evaluation findings, risk stratification, comorbidities and participant's personal goals.       Expected Outcomes  Achievement of increased cardiorespiratory fitness and enhanced flexibility, muscular endurance and strength shown through measurements of functional capacity and personal statement of participant.       Able to understand and use rate of perceived exertion (RPE) scale  Yes       Intervention  Provide education and explanation on how to use RPE scale       Expected Outcomes  Short Term: Able to use RPE daily in rehab to express subjective intensity level;Long Term:  Able to use RPE to guide intensity level when exercising independently       Able to understand and use Dyspnea scale  Yes       Intervention  Provide education and explanation on how to use  Dyspnea scale       Expected Outcomes  Short Term: Able to use Dyspnea scale daily in rehab to express subjective sense of shortness of breath during exertion;Long Term: Able to use Dyspnea scale to guide intensity level when exercising independently       Knowledge and understanding of Target Heart Rate Range (THRR)  Yes       Intervention  Provide education and explanation of THRR including how the numbers were predicted and where they are located for reference       Expected Outcomes  Short Term: Able to state/look up THRR;Long Term: Able to use THRR to govern intensity when exercising independently;Short Term: Able to use daily as guideline for intensity in rehab       Understanding of Exercise Prescription  Yes       Intervention  Provide education, explanation, and written materials on patient's individual exercise prescription       Expected Outcomes  Short Term: Able to explain program exercise prescription;Long Term: Able to explain home exercise prescription  to exercise independently          Exercise Goals Re-Evaluation : Exercise Goals Re-Evaluation    Row Name 04/29/17 1010 05/23/17 1640 06/10/17 1627 06/25/17 1632       Exercise Goal Re-Evaluation   Exercise Goals Review  Increase Strength and Stamina;Able to understand and use Dyspnea scale;Able to understand and use rate of perceived exertion (RPE) scale;Knowledge and understanding of Target Heart Rate Range (THRR);Understanding of Exercise Prescription;Increase Physical Activity  Increase Strength and Stamina;Increase Physical Activity;Able to understand and use Dyspnea scale;Able to understand and use rate of perceived exertion (RPE) scale;Knowledge and understanding of Target Heart Rate Range (THRR);Understanding of Exercise Prescription  Increase Strength and Stamina;Increase Physical Activity;Able to understand and use Dyspnea scale;Able to understand and use rate of perceived exertion (RPE) scale;Knowledge and understanding of  Target Heart Rate Range (THRR);Understanding of Exercise Prescription  Increase Strength and Stamina;Increase Physical Activity;Able to understand and use Dyspnea scale;Able to understand and use rate of perceived exertion (RPE) scale;Knowledge and understanding of Target Heart Rate Range (THRR);Understanding of Exercise Prescription    Comments  Patient has only attended 4 sessions. Will cont. to monitor and progress as able.   Patient has been progressing well in the program. Walking 16 laps (200 ft each) in 15 minutes. Will go over home exercise in the nexts few weeks. Will cont. to monitor.   Patient has been progressing well in the program. Walking 17 laps (200 ft each) in 15 minutes. Will go over home exercise in the nexts few weeks. Will cont. to monitor.   Patient has been progressing well in the program. Walking 17 laps (200 ft each) in 15 minutes. Will go over home exercise in the nexts few weeks. Will cont. to monitor.     Expected Outcomes  Through exercise at rehab and at home, patient will increase strength and stamina and decrease shortness of breath.  Through exercise at rehab and at home, patient will increase strength and stamina making ADL's easier to perform. Patient will also have a better understanding of safe exercise and what they are capable to do outside of clinical supervision.  Through exercise at rehab and at home, patient will increase strength and stamina making ADL's easier to perform. Patient will also have a better understanding of safe exercise and what they are capable to do outside of clinical supervision.  Through exercise at rehab and at home, patient will increase strength and stamina making ADL's easier to perform. Patient will also have a better understanding of safe exercise and what they are capable to do outside of clinical supervision.       Discharge Exercise Prescription (Final Exercise Prescription Changes): Exercise Prescription Changes - 06/27/17 1300       Response to Exercise   Blood Pressure (Admit)  114/70    Blood Pressure (Exercise)  176/72    Blood Pressure (Exit)  124/72    Heart Rate (Admit)  54 bpm    Heart Rate (Exercise)  88 bpm    Heart Rate (Exit)  62 bpm    Oxygen Saturation (Admit)  97 %    Oxygen Saturation (Exercise)  91 %    Oxygen Saturation (Exit)  95 %    Rating of Perceived Exertion (Exercise)  11    Perceived Dyspnea (Exercise)  2    Duration  Continue with 45 min of aerobic exercise without signs/symptoms of physical distress.    Intensity  THRR unchanged      Progression  Progression  Continue to progress workloads to maintain intensity without signs/symptoms of physical distress.      Resistance Training   Training Prescription  Yes    Weight  orange bands    Reps  10-15    Time  10 Minutes      Bike   Level  1.3    Minutes  17      NuStep   Level  5    Minutes  17    METs  3.7       Nutrition:  Target Goals: Understanding of nutrition guidelines, daily intake of sodium 1500mg , cholesterol 200mg , calories 30% from fat and 7% or less from saturated fats, daily to have 5 or more servings of fruits and vegetables.  Biometrics: Pre Biometrics - 04/05/17 1000      Pre Biometrics   Grip Strength  26 kg        Nutrition Therapy Plan and Nutrition Goals: Nutrition Therapy & Goals - 04/18/17 1421      Nutrition Therapy   Diet  Therapeutic Lifestyle Changes      Personal Nutrition Goals   Nutrition Goal  Identify food quantities necessary to achieve wt loss of  -2# per week to a goal wt loss of 2.7-10.9 kg (6-24 lb) at graduation from pulmonary rehab.      Intervention Plan   Intervention  Prescribe, educate and counsel regarding individualized specific dietary modifications aiming towards targeted core components such as weight, hypertension, lipid management, diabetes, heart failure and other comorbidities.    Expected Outcomes  Short Term Goal: Understand basic principles of dietary  content, such as calories, fat, sodium, cholesterol and nutrients.;Long Term Goal: Adherence to prescribed nutrition plan.       Nutrition Discharge: Rate Your Plate Scores:   Nutrition Goals Re-Evaluation:   Nutrition Goals Discharge (Final Nutrition Goals Re-Evaluation):   Psychosocial: Target Goals: Acknowledge presence or absence of significant depression and/or stress, maximize coping skills, provide positive support system. Participant is able to verbalize types and ability to use techniques and skills needed for reducing stress and depression.  Initial Review & Psychosocial Screening: Initial Psych Review & Screening - 04/05/17 1011      Initial Review   Current issues with  None Identified      Family Dynamics   Good Support System?  Yes      Barriers   Psychosocial barriers to participate in program  There are no identifiable barriers or psychosocial needs.      Screening Interventions   Interventions  Encouraged to exercise       Quality of Life Scores:   PHQ-9: Recent Review Flowsheet Data    Depression screen [facility] 2/9 04/05/2017   Decreased Interest 0   Down, Depressed, Hopeless 0   PHQ - 2 Score 0     Interpretation of Total Score  Total Score Depression Severity:  1-4 = Minimal depression, 5-9 = Mild depression, 10-14 = Moderate depression, 15-19 = Moderately severe depression, 20-27 = Severe depression   Psychosocial Evaluation and Intervention: Psychosocial Evaluation - 04/05/17 1014      Psychosocial Evaluation & Interventions   Interventions  Encouraged to exercise with the program and follow exercise prescription    Expected Outcomes  patient will remain free from psychosocial barriers to participation    Continue Psychosocial Services   No Follow up required       Psychosocial Re-Evaluation: Psychosocial Re-Evaluation    Row Name 04/26/17 1159 05/20/17 1201 06/10/17 1810  07/04/17 0645       Psychosocial Re-Evaluation   Current issues  with  None Identified  None Identified  None Identified  None Identified    Expected Outcomes  patient will remain free from psychosocial barriers to participation in pulmonary rehab  patient will remain free from psychosocial barriers to participation in pulmonary rehab  patient will remain free from psychosocial barriers to participation in pulmonary rehab  patient will remain free from psychosocial barriers to participation in pulmonary rehab    Continue Psychosocial Services   No Follow up required  No Follow up required  No Follow up required  No Follow up required       Psychosocial Discharge (Final Psychosocial Re-Evaluation): Psychosocial Re-Evaluation - 07/04/17 0645      Psychosocial Re-Evaluation   Current issues with  None Identified    Expected Outcomes  patient will remain free from psychosocial barriers to participation in pulmonary rehab    Continue Psychosocial Services   No Follow up required       Education: Education Goals: Education classes will be provided on a weekly basis, covering required topics. Participant will state understanding/return demonstration of topics presented.  Learning Barriers/Preferences: Learning Barriers/Preferences - 04/05/17 1009      Learning Barriers/Preferences   Learning Barriers  None    Learning Preferences  Written Material;Verbal Instruction;Group Instruction;Computer/Internet       Education Topics: Risk Factor Reduction:  -Group instruction that is supported by a PowerPoint presentation. Instructor discusses the definition of a risk factor, different risk factors for pulmonary disease, and how the heart and lungs work together.     Nutrition for Pulmonary Patient:  -Group instruction provided by PowerPoint slides, verbal discussion, and written materials to support subject matter. The instructor gives an explanation and review of healthy diet recommendations, which includes a discussion on weight management, recommendations  for fruit and vegetable consumption, as well as protein, fluid, caffeine, fiber, sodium, sugar, and alcohol. Tips for eating when patients are short of breath are discussed.   PULMONARY REHAB OTHER RESPIRATORY from 06/27/2017 in [facility] CARDIAC REHAB  Date  04/25/17  Educator  edna  Instruction Review Code  2- meets goals/outcomes      Pursed Lip Breathing:  -Group instruction that is supported by demonstration and informational handouts. Instructor discusses the benefits of pursed lip and diaphragmatic breathing and detailed demonstration on how to preform both.     Oxygen Safety:  -Group instruction provided by PowerPoint, verbal discussion, and written material to support subject matter. There is an overview of "What is Oxygen" and "Why do we need it".  Instructor also reviews how to create a safe environment for oxygen use, the importance of using oxygen as prescribed, and the risks of noncompliance. There is a brief discussion on traveling with oxygen and resources the patient may utilize.   PULMONARY REHAB OTHER RESPIRATORY from 06/27/2017 in [facility] CARDIAC REHAB  Date  05/02/17  Educator  Daniel Nones  Instruction Review Code  2- meets goals/outcomes      Oxygen Equipment:  -Group instruction provided by [employer] Staff utilizing handouts, written materials, and equipment demonstrations.   Signs and Symptoms:  -Group instruction provided by written material and verbal discussion to support subject matter. Warning signs and symptoms of infection, stroke, and heart attack are reviewed and when to call the physician/911 reinforced. Tips for preventing the spread of infection discussed.   Advanced Directives:  -Group instruction provided by verbal instruction  and written material to support subject matter. Instructor reviews Advanced Directive laws and proper instruction for filling out document.   Pulmonary Video:  -Group video education  that reviews the importance of medication and oxygen compliance, exercise, good nutrition, pulmonary hygiene, and pursed lip and diaphragmatic breathing for the pulmonary patient.   Exercise for the Pulmonary Patient:  -Group instruction that is supported by a PowerPoint presentation. Instructor discusses benefits of exercise, core components of exercise, frequency, duration, and intensity of an exercise routine, importance of utilizing pulse oximetry during exercise, safety while exercising, and options of places to exercise outside of rehab.     Pulmonary Medications:  -Verbally interactive group education provided by instructor with focus on inhaled medications and proper administration.   PULMONARY REHAB OTHER RESPIRATORY from 06/27/2017 in [facility] CARDIAC REHAB  Date  05/09/17  Educator  Pharm D  Instruction Review Code  2- meets goals/outcomes      Anatomy and Physiology of the Respiratory System and Intimacy:  -Group instruction provided by PowerPoint, verbal discussion, and written material to support subject matter. Instructor reviews respiratory cycle and anatomical components of the respiratory system and their functions. Instructor also reviews differences in obstructive and restrictive respiratory diseases with examples of each. Intimacy, Sex, and Sexuality differences are reviewed with a discussion on how relationships can change when diagnosed with pulmonary disease. Common sexual concerns are reviewed.   PULMONARY REHAB OTHER RESPIRATORY from 06/27/2017 in [facility] CARDIAC REHAB  Date  06/20/17  Educator  RN  Instruction Review Code  2- meets goals/outcomes      MD DAY -A group question and answer session with a medical doctor that allows participants to ask questions that relate to their pulmonary disease state.   PULMONARY REHAB OTHER RESPIRATORY from 06/27/2017 in [facility] CARDIAC REHAB  Date  04/16/17   Educator  Dr. Molli Knock  Instruction Review Code  2- meets goals/outcomes      OTHER EDUCATION -Group or individual verbal, written, or video instructions that support the educational goals of the pulmonary rehab program.   Knowledge Questionnaire Score:   Core Components/Risk Factors/Patient Goals at Admission: Personal Goals and Risk Factors at Admission - 04/05/17 1011      Core Components/Risk Factors/Patient Goals on Admission   Improve shortness of breath with ADL's  Yes    Intervention  Provide education, individualized exercise plan and daily activity instruction to help decrease symptoms of SOB with activities of daily living.    Expected Outcomes  Short Term: Achieves a reduction of symptoms when performing activities of daily living.    Develop more efficient breathing techniques such as purse lipped breathing and diaphragmatic breathing; and practicing self-pacing with activity  Yes    Intervention  Provide education, demonstration and support about specific breathing techniuqes utilized for more efficient breathing. Include techniques such as pursed lipped breathing, diaphragmatic breathing and self-pacing activity.    Expected Outcomes  Short Term: Participant will be able to demonstrate and use breathing techniques as needed throughout daily activities.    Increase knowledge of respiratory medications and ability to use respiratory devices properly   Yes    Intervention  Provide education and demonstration as needed of appropriate use of medications, inhalers, and oxygen therapy.    Expected Outcomes  Short Term: Achieves understanding of medications use. Understands that oxygen is a medication prescribed by physician. Demonstrates appropriate use of inhaler and oxygen therapy.       Core  Components/Risk Factors/Patient Goals Review:  Goals and Risk Factor Review    Row Name 04/26/17 1154 05/20/17 1154 06/10/17 1757 07/04/17 0645       Core Components/Risk  Factors/Patient Goals Review   Personal Goals Review  Develop more efficient breathing techniques such as purse lipped breathing and diaphragmatic breathing and practicing self-pacing with activity.;Improve shortness of breath with ADL's;Increase knowledge of respiratory medications and ability to use respiratory devices properly.  Develop more efficient breathing techniques such as purse lipped breathing and diaphragmatic breathing and practicing self-pacing with activity.;Improve shortness of breath with ADL's;Increase knowledge of respiratory medications and ability to use respiratory devices properly.  Develop more efficient breathing techniques such as purse lipped breathing and diaphragmatic breathing and practicing self-pacing with activity.;Improve shortness of breath with ADL's;Increase knowledge of respiratory medications and ability to use respiratory devices properly.  Develop more efficient breathing techniques such as purse lipped breathing and diaphragmatic breathing and practicing self-pacing with activity.;Improve shortness of breath with ADL's;Increase knowledge of respiratory medications and ability to use respiratory devices properly.    Review  patient has only attended 4 sessions since admission and it is too early to evaluate progress towards goals  patient is doing well in pulmonary rehab. He has attended an education session with the pharmacist on pulmonary medications and has been observed utilizing PLB without prompting during exercise  patient continues to progress well in pulmonary rehab. He feels his shortness of breath is improving with his ADLs and he is observed utilizing PLB during exertion. Expect to see further progression over the next 30 days.  patient continues to progress well in pulmonary rehab. He feels his shortness of breath is improving with his ADLs and he is observed utilizing PLB during exertion. Expect to see further progression over the next 30 days.    Expected  Outcomes  see admission expected outcomes.  see admission expected outcomes.  see admission expected outcomes.  see admission expected outcomes.       Core Components/Risk Factors/Patient Goals at Discharge (Final Review):  Goals and Risk Factor Review - 07/04/17 0645      Core Components/Risk Factors/Patient Goals Review   Personal Goals Review  Develop more efficient breathing techniques such as purse lipped breathing and diaphragmatic breathing and practicing self-pacing with activity.;Improve shortness of breath with ADL's;Increase knowledge of respiratory medications and ability to use respiratory devices properly.    Review  patient continues to progress well in pulmonary rehab. He feels his shortness of breath is improving with his ADLs and he is observed utilizing PLB during exertion. Expect to see further progression over the next 30 days.    Expected Outcomes  see admission expected outcomes.       ITP Comments:   Comments: Patient has attended 16 sessions since admission to pulmonary rehab.

## 2017-07-11 ENCOUNTER — Encounter (HOSPITAL_COMMUNITY)
Admission: RE | Admit: 2017-07-11 | Discharge: 2017-07-11 | Disposition: A | Discharge status: Home / Self Care | Payer: Non-veteran care | Source: Ambulatory Visit | Attending: Pulmonary Disease | Admitting: Pulmonary Disease

## 2017-07-11 VITALS — Wt 198.9 lb

## 2017-07-11 DIAGNOSIS — J84112 Idiopathic pulmonary fibrosis: Secondary | ICD-10-CM | POA: Insufficient documentation

## 2017-07-11 DIAGNOSIS — J8410 Pulmonary fibrosis, unspecified: Secondary | ICD-10-CM

## 2017-07-11 NOTE — Progress Notes (Signed)
Daily Session Note  Patient Details  Name: Patrick Knapp MRN: [id number] Date of Birth: 1927-08-29 Referring Provider:     Pulmonary Rehab Walk Test from 04/09/2017 in [place]  Referring Provider  Dr. Nelda Marseille      Encounter Date: 07/11/2017  Check In: Session Check In - 07/11/17 1106      Check-In   Location  [facility]    Staff Present  Trish Fountain, RN, BSN;Lisa Ysidro Evert, RN;Molly diVincenzo, MS, ACSM RCEP, Exercise Physiologist;Joan Leonia Reeves, RN, BSN    Supervising physician immediately available to respond to emergencies  [facility] immediately available    Physician(s)  Dr. Eliseo Squires    Medication changes reported      No    Fall or balance concerns reported     No    Tobacco Cessation  No Change    Warm-up and Cool-down  Performed as group-led instruction    Resistance Training Performed  Yes    VAD Patient?  No      Pain Assessment   Currently in Pain?  No/denies    Multiple Pain Sites  No       Capillary Blood Glucose: No results found for this or any previous visit (from the past 24 hour(s)).  Exercise Prescription Changes - 07/11/17 1335      Response to Exercise   Blood Pressure (Admit)  130/64    Blood Pressure (Exercise)  160/70    Blood Pressure (Exit)  110/60    Heart Rate (Admit)  55 bpm    Heart Rate (Exercise)  80 bpm    Heart Rate (Exit)  54 bpm    Oxygen Saturation (Admit)  97 %    Oxygen Saturation (Exercise)  92 %    Oxygen Saturation (Exit)  97 %    Rating of Perceived Exertion (Exercise)  12    Perceived Dyspnea (Exercise)  1    Duration  Continue with 45 min of aerobic exercise without signs/symptoms of physical distress.    Intensity  THRR unchanged      Progression   Progression  Continue to progress workloads to maintain intensity without signs/symptoms of physical distress.      Resistance Training   Training Prescription  Yes    Weight  orange bands    Reps  10-15    Time   10 Minutes      Bike   Level  1.3    Minutes  17      NuStep   Level  5    Minutes  17    METs  3.6       Social History   Tobacco Use  Smoking Status Never Smoker  Smokeless Tobacco Never Used    Goals Met:  Independence with exercise equipment Improved SOB with ADL's Using PLB without cueing & demonstrates good technique Exercise tolerated well No report of cardiac concerns or symptoms Strength training completed today  Goals Unmet:  Not Applicable  Comments: Service time is from 1030 to [place]   Dr. Rush Farmer is Medical Director for Pulmonary Rehab at [facility].

## 2017-07-16 ENCOUNTER — Encounter (HOSPITAL_COMMUNITY)
Admission: RE | Admit: 2017-07-16 | Discharge: 2017-07-16 | Disposition: A | Discharge status: Home / Self Care | Payer: Non-veteran care | Source: Ambulatory Visit | Attending: Pulmonary Disease | Admitting: Pulmonary Disease

## 2017-07-16 VITALS — Wt 200.2 lb

## 2017-07-16 DIAGNOSIS — J84112 Idiopathic pulmonary fibrosis: Secondary | ICD-10-CM | POA: Diagnosis not present

## 2017-07-16 DIAGNOSIS — J8410 Pulmonary fibrosis, unspecified: Secondary | ICD-10-CM

## 2017-07-16 NOTE — Progress Notes (Signed)
Daily Session Note  Patient Details  Name: Patrick Knapp MRN: [id number] Date of Birth: 1927/10/25 Referring Provider:     Pulmonary Rehab Walk Test from 04/09/2017 in [place]  Referring Provider  Dr. Nelda Marseille      Encounter Date: 07/16/2017  Check In: Session Check In - 07/16/17 1032      Check-In   Location  [facility]    Staff Present  Rosebud Poles, RN, BSN;Ryatt Corsino, MS, ACSM RCEP, Exercise Physiologist;Lisa Ysidro Evert, RN;Portia Rollene Rotunda, RN, BSN    Supervising physician immediately available to respond to emergencies  [facility] immediately available    Physician(s)  Dr. Eliseo Squires    Medication changes reported      No    Fall or balance concerns reported     No    Tobacco Cessation  No Change    Warm-up and Cool-down  Performed as group-led instruction    Resistance Training Performed  Yes    VAD Patient?  No      Pain Assessment   Currently in Pain?  No/denies    Multiple Pain Sites  No       Capillary Blood Glucose: No results found for this or any previous visit (from the past 24 hour(s)).  Exercise Prescription Changes - 07/16/17 1200      Response to Exercise   Blood Pressure (Admit)  110/40    Blood Pressure (Exercise)  144/60    Blood Pressure (Exit)  110/60    Heart Rate (Admit)  50 bpm    Heart Rate (Exercise)  87 bpm    Heart Rate (Exit)  64 bpm    Oxygen Saturation (Admit)  96 %    Oxygen Saturation (Exercise)  90 %    Oxygen Saturation (Exit)  98 %    Rating of Perceived Exertion (Exercise)  12    Perceived Dyspnea (Exercise)  1    Duration  Continue with 45 min of aerobic exercise without signs/symptoms of physical distress.    Intensity  THRR unchanged      Progression   Progression  Continue to progress workloads to maintain intensity without signs/symptoms of physical distress.      Resistance Training   Training Prescription  Yes    Weight  orange bands    Reps  10-15    Time   10 Minutes      Bike   Level  1.3    Minutes  17      NuStep   Level  5    Minutes  17    METs  2.8      Track   Laps  17    Minutes  17       Social History   Tobacco Use  Smoking Status Never Smoker  Smokeless Tobacco Never Used    Goals Met:  Exercise tolerated well No report of cardiac concerns or symptoms Strength training completed today  Goals Unmet:  Not Applicable  Comments: Service time is from 10:30a to 12:00p    Dr. Rush Farmer is Medical Director for Pulmonary Rehab at [facility].

## 2017-07-18 ENCOUNTER — Encounter (HOSPITAL_COMMUNITY)
Admission: RE | Admit: 2017-07-18 | Discharge: 2017-07-18 | Disposition: A | Discharge status: Home / Self Care | Payer: Non-veteran care | Source: Ambulatory Visit | Attending: Pulmonary Disease | Admitting: Pulmonary Disease

## 2017-07-18 VITALS — Wt 197.8 lb

## 2017-07-18 DIAGNOSIS — J84112 Idiopathic pulmonary fibrosis: Secondary | ICD-10-CM | POA: Diagnosis not present

## 2017-07-18 DIAGNOSIS — J8410 Pulmonary fibrosis, unspecified: Secondary | ICD-10-CM

## 2017-07-18 NOTE — Progress Notes (Signed)
I have reviewed a Home Exercise Prescription with Patrick Knapp . Patrick Knapp is not currently exercising at home.  The patient was advised to walk 3 days a week for 30-45 minutes.  Patrick Knapp and I discussed how to progress their exercise prescription.  The patient stated that their goals were to stay in shape and be able to travel.  The patient stated that they understand the exercise prescription.  We reviewed exercise guidelines, target heart rate during exercise, oxygen use, weather, home pulse oximeter, endpoints for exercise, and goals.  Patient is encouraged to come to me with any questions. I will continue to follow up with the patient to assist them with progression and safety.

## 2017-07-18 NOTE — Progress Notes (Signed)
Daily Session Note  Patient Details  Name: Patrick Knapp MRN: [id number] Date of Birth: Jul 21, 1927 Referring Provider:     Pulmonary Rehab Walk Test from 04/09/2017 in [place]  Referring Provider  Dr. Nelda Marseille      Encounter Date: 07/18/2017  Check In: Session Check In - 07/18/17 1030      Check-In   Location  [facility]    Staff Present  Rosebud Poles, RN, BSN;Molly diVincenzo, MS, ACSM RCEP, Exercise Physiologist;Portia Rollene Rotunda, RN, Roque Cash, RN    Supervising physician immediately available to respond to emergencies  [facility] immediately available    Physician(s)  Dr. Cruzita Lederer    Medication changes reported      No    Fall or balance concerns reported     No    Tobacco Cessation  No Change    Warm-up and Cool-down  Performed as group-led instruction    Resistance Training Performed  Yes    VAD Patient?  No      Pain Assessment   Currently in Pain?  No/denies    Multiple Pain Sites  No       Capillary Blood Glucose: No results found for this or any previous visit (from the past 24 hour(s)).  Exercise Prescription Changes - 07/18/17 1200      Response to Exercise   Blood Pressure (Admit)  124/54    Blood Pressure (Exercise)  154/54    Blood Pressure (Exit)  100/60    Heart Rate (Admit)  61 bpm    Heart Rate (Exercise)  93 bpm    Heart Rate (Exit)  63 bpm    Oxygen Saturation (Admit)  95 %    Oxygen Saturation (Exercise)  91 %    Oxygen Saturation (Exit)  97 %    Rating of Perceived Exertion (Exercise)  12    Perceived Dyspnea (Exercise)  2    Duration  Continue with 45 min of aerobic exercise without signs/symptoms of physical distress.    Intensity  THRR unchanged      Progression   Progression  Continue to progress workloads to maintain intensity without signs/symptoms of physical distress.      Resistance Training   Training Prescription  Yes    Weight  orange bands    Reps  10-15    Time  10 Minutes      NuStep   Level  5    Minutes  17    METs  3.1      Track   Laps  17    Minutes  17       Social History   Tobacco Use  Smoking Status Never Smoker  Smokeless Tobacco Never Used    Goals Met:  Exercise tolerated well Strength training completed today  Goals Unmet:  Not Applicable  Comments: Service time is from 1030 to 1230    Dr. Rush Farmer is Medical Director for Pulmonary Rehab at [facility].

## 2017-07-23 ENCOUNTER — Encounter (HOSPITAL_COMMUNITY)
Admission: RE | Admit: 2017-07-23 | Discharge: 2017-07-23 | Disposition: A | Discharge status: Home / Self Care | Payer: Non-veteran care | Source: Ambulatory Visit | Attending: Pulmonary Disease | Admitting: Pulmonary Disease

## 2017-07-23 VITALS — Wt 198.2 lb

## 2017-07-23 DIAGNOSIS — J8410 Pulmonary fibrosis, unspecified: Secondary | ICD-10-CM

## 2017-07-23 DIAGNOSIS — J84112 Idiopathic pulmonary fibrosis: Secondary | ICD-10-CM | POA: Diagnosis not present

## 2017-07-23 NOTE — Progress Notes (Signed)
Daily Session Note  Patient Details  Name: Patrick Knapp MRN: [id number] Date of Birth: 1928-06-10 Referring Provider:     Pulmonary Rehab Walk Test from 04/09/2017 in [place]  Referring Provider  Dr. Nelda Marseille      Encounter Date: 07/23/2017  Check In: Session Check In - 07/23/17 1030      Check-In   Location  [facility]    Staff Present  Rosebud Poles, RN, Luisa Hart, RN, BSN;Honestii Marton, MS, ACSM RCEP, Exercise Physiologist;Lisa Ysidro Evert, RN    Supervising physician immediately available to respond to emergencies  [facility] immediately available    Physician(s)  Dr. Eliseo Squires    Medication changes reported      No    Fall or balance concerns reported     No    Tobacco Cessation  No Change    Warm-up and Cool-down  Performed as group-led instruction    Resistance Training Performed  Yes    VAD Patient?  No      Pain Assessment   Currently in Pain?  No/denies    Multiple Pain Sites  No       Capillary Blood Glucose: No results found for this or any previous visit (from the past 24 hour(s)).  Exercise Prescription Changes - 07/23/17 1200      Response to Exercise   Blood Pressure (Admit)  110/50    Blood Pressure (Exercise)  174/66    Blood Pressure (Exit)  124/70    Heart Rate (Admit)  88 bpm    Heart Rate (Exercise)  105 bpm    Heart Rate (Exit)  73 bpm    Oxygen Saturation (Admit)  96 %    Oxygen Saturation (Exercise)  89 %    Oxygen Saturation (Exit)  97 %    Rating of Perceived Exertion (Exercise)  12    Perceived Dyspnea (Exercise)  2    Duration  Continue with 45 min of aerobic exercise without signs/symptoms of physical distress.    Intensity  THRR unchanged      Progression   Progression  Continue to progress workloads to maintain intensity without signs/symptoms of physical distress.      Resistance Training   Training Prescription  Yes    Weight  orange bands    Reps  10-15    Time   10 Minutes      Bike   Level  1.3    Minutes  17      NuStep   Level  5    Minutes  17    METs  3.9      Track   Laps  18    Minutes  17       Social History   Tobacco Use  Smoking Status Never Smoker  Smokeless Tobacco Never Used    Goals Met:  Exercise tolerated well Queuing for purse lip breathing No report of cardiac concerns or symptoms  Goals Unmet:  Not Applicable  Comments: Service time is from 10:30A to 12:00P    Dr. Rush Farmer is Medical Director for Pulmonary Rehab at [facility].

## 2017-07-24 ENCOUNTER — Telehealth: Payer: Self-pay | Admitting: Pulmonary Disease

## 2017-07-24 NOTE — Telephone Encounter (Signed)
lmtcb for pt.  Attestation signed by Oretha MilchAlva, Rakesh V, MD at 06/24/2017 12:59 PM  Independently examined pt, evaluated data & formulated above care plan with resident.  He started pulmonary rehab and is feeling well.  No new symptoms.  Spirometry shows a slight drop in FVC from 75-68% over the last 2 months-however he is asymptomatic. DLCO was noted 57% in 04/2017. We discussed risks and benefits of starting medication to decrease progression of IPF. He would like to hold off for now  FirstEnergy Corpakesh V. Vassie LollAlva MD    Per last OV pt did not wish to proceed with Ofev.

## 2017-07-24 NOTE — Telephone Encounter (Signed)
ATC pt, no answer. Left message for pt to call back.  

## 2017-07-24 NOTE — Telephone Encounter (Signed)
Patient returned call, CB is [phone].

## 2017-07-25 ENCOUNTER — Encounter (HOSPITAL_COMMUNITY)
Admission: RE | Admit: 2017-07-25 | Discharge: 2017-07-25 | Disposition: A | Discharge status: Home / Self Care | Payer: Non-veteran care | Source: Ambulatory Visit | Attending: Pulmonary Disease | Admitting: Pulmonary Disease

## 2017-07-25 VITALS — Wt 200.0 lb

## 2017-07-25 DIAGNOSIS — J8410 Pulmonary fibrosis, unspecified: Secondary | ICD-10-CM

## 2017-07-25 DIAGNOSIS — J84112 Idiopathic pulmonary fibrosis: Secondary | ICD-10-CM | POA: Diagnosis not present

## 2017-07-25 NOTE — Progress Notes (Signed)
Daily Session Note  Patient Details  Name: Patrick Knapp MRN: [id number] Date of Birth: 1927-11-01 Referring Provider:     Pulmonary Rehab Walk Test from 04/09/2017 in [place]  Referring Provider  Dr. Nelda Marseille      Encounter Date: 07/25/2017  Check In: Session Check In - 07/25/17 1027      Check-In   Location  [facility]    Staff Present  Trish Fountain, RN, Maxcine Ham, RN, BSN;Lisa Hughes, RN;Molly diVincenzo, MS, ACSM RCEP, Exercise Physiologist    Supervising physician immediately available to respond to emergencies  [facility] immediately available    Physician(s)  Dr. Cathlean Sauer    Medication changes reported      No    Fall or balance concerns reported     No    Tobacco Cessation  No Change    Warm-up and Cool-down  Performed as group-led instruction    Resistance Training Performed  Yes    VAD Patient?  No      Pain Assessment   Currently in Pain?  No/denies    Multiple Pain Sites  No       Capillary Blood Glucose: No results found for this or any previous visit (from the past 24 hour(s)).  Exercise Prescription Changes - 07/25/17 1221      Response to Exercise   Blood Pressure (Admit)  116/44    Blood Pressure (Exercise)  166/80    Blood Pressure (Exit)  112/60    Heart Rate (Admit)  54 bpm    Heart Rate (Exercise)  89 bpm    Heart Rate (Exit)  55 bpm    Oxygen Saturation (Admit)  95 %    Oxygen Saturation (Exercise)  91 %    Oxygen Saturation (Exit)  96 %    Rating of Perceived Exertion (Exercise)  14    Perceived Dyspnea (Exercise)  2    Duration  Continue with 45 min of aerobic exercise without signs/symptoms of physical distress.    Intensity  THRR unchanged      Progression   Progression  Continue to progress workloads to maintain intensity without signs/symptoms of physical distress.      Resistance Training   Training Prescription  Yes    Weight  orange bands    Reps  10-15    Time  10 Minutes      Bike   Level  1.3    Minutes  17      Track   Laps  18    Minutes  17       Social History   Tobacco Use  Smoking Status Never Smoker  Smokeless Tobacco Never Used    Goals Met:  Independence with exercise equipment Improved SOB with ADL's Using PLB without cueing & demonstrates good technique Exercise tolerated well No report of cardiac concerns or symptoms Strength training completed today  Goals Unmet:  Not Applicable  Comments: Service time is from 1030 to 1215   Dr. Rush Farmer is Medical Director for Pulmonary Rehab at [facility].

## 2017-07-26 MED ORDER — NINTEDANIB ESYLATE 150 MG PO CAPS: 150.0000 mg | ORAL_CAPSULE | Freq: Two times a day (BID) | ORAL | 12 refills | Status: DC

## 2017-07-26 NOTE — Telephone Encounter (Signed)
Okay to start OFEV 150 bid

## 2017-07-26 NOTE — Telephone Encounter (Signed)
Spoke with patient. He is ready to begin the process with [employer]. Advised patient that he would need to stop by the office sometime next week to sign the patient assistance forms for [employer], he verbalized understanding. Will fill out forms and have them at the front desk to sign.   Will leave this message in my inbox to follow up on.

## 2017-07-26 NOTE — Telephone Encounter (Signed)
lmtcb x1 for pt. 

## 2017-07-26 NOTE — Telephone Encounter (Signed)
Spoke with pt, he states he would start the OFEV if RA felt he needed to. He will take OFEv and wants to know if he can get the ball rolling on this if RA thinks it is time to start it now before his next appointment. RA please advise.

## 2017-07-29 NOTE — Telephone Encounter (Signed)
Patient stated that he will stop by the office today to sign forms.

## 2017-07-30 ENCOUNTER — Encounter (HOSPITAL_COMMUNITY)
Admission: RE | Admit: 2017-07-30 | Discharge: 2017-07-30 | Disposition: A | Discharge status: Home / Self Care | Payer: Non-veteran care | Source: Ambulatory Visit | Attending: Pulmonary Disease | Admitting: Pulmonary Disease

## 2017-07-30 VITALS — Wt 198.6 lb

## 2017-07-30 DIAGNOSIS — J8410 Pulmonary fibrosis, unspecified: Secondary | ICD-10-CM

## 2017-07-30 DIAGNOSIS — J84112 Idiopathic pulmonary fibrosis: Secondary | ICD-10-CM | POA: Diagnosis not present

## 2017-07-30 NOTE — Progress Notes (Signed)
Pulmonary Individual Treatment Plan  Patient Details  Name: Patrick Knapp MRN: [id number] Date of Birth: 1928/06/26 Referring Provider:     Pulmonary Rehab Walk Test from 04/09/2017 in [facility]  Referring Provider  Dr. Nelda Marseille      Initial Encounter Date:    Pulmonary Rehab Walk Test from 04/09/2017 in [facility]  Date  04/09/17  Referring Provider  Dr. Nelda Marseille      Visit Diagnosis: Pulmonary fibrosis ([place])  Patient's Home Medications on Admission:   Current Outpatient Medications:  .  acetaminophen (TYLENOL) 325 MG tablet, Take 2 tablets (650 mg total) by mouth every 6 (six) hours as needed for mild pain (or Fever >/= 101)., Disp: 40 tablet, Rfl: 0 .  ALPRAZolam (XANAX) 0.5 MG tablet, Take 0.5 mg by mouth 3 (three) times daily as needed. AS NEEDED FOR ANXIETY, Disp: , Rfl:  .  amLODipine (NORVASC) 2.5 MG tablet, Take 1 tablet (2.5 mg total) by mouth daily., Disp: 90 tablet, Rfl: 3 .  atorvastatin (LIPITOR) 20 MG tablet, Take 20 mg by mouth daily. , Disp: , Rfl:  .  cholecalciferol (VITAMIN D) 1000 units tablet, Take 1,000 Units by mouth daily., Disp: , Rfl:  .  clopidogrel (PLAVIX) 75 MG tablet, Take 1 tablet (75 mg total) by mouth daily., Disp: 30 tablet, Rfl: 11 .  doxazosin (CARDURA) 4 MG tablet, Take 2 mg by mouth every morning. , Disp: , Rfl:  .  isosorbide mononitrate (IMDUR) 30 MG 24 hr tablet, TAKE 1 TABLET (30 MG TOTAL) BY MOUTH DAILY., Disp: 90 tablet, Rfl: 1 .  magnesium gluconate (MAGONATE) 500 MG tablet, Take 500 mg by mouth daily., Disp: , Rfl:  .  metoprolol tartrate (LOPRESSOR) 50 MG tablet, Take 0.5 tablets (25 mg total) by mouth 2 (two) times daily., Disp: 90 tablet, Rfl: 1 .  Nintedanib (OFEV) 150 MG CAPS, Take 150 mg by mouth 2 (two) times daily., Disp: 60 capsule, Rfl: 12 .  nitroGLYCERIN (NITROSTAT) 0.4 MG SL tablet, Place 1 tablet (0.4 mg total) under the tongue every 5 (five) minutes as  needed for chest pain., Disp: 25 tablet, Rfl: 3  Past Medical History: Past Medical History:  Diagnosis Date  . Arthritis    "all the joints" (02/06/2016)  . Coronary artery disease    a. PCI with DES to mid LAD 7/17 b.post PTCA and stenting of his left circumflex artery in 12/08  . Enlarged prostate   . GERD (gastroesophageal reflux disease)   . Hyperlipidemia   . Hypertension   . Ischemia   . Pulmonary fibrosis, unspecified ([place])   . Shortness of breath   . Skin disorder    Occurs at times as rash, whelps, blister-like areas  with itching sometimes-has occurred for years , but can not find an exact dx or cause    Tobacco Use: Social History   Tobacco Use  Smoking Status Never Smoker  Smokeless Tobacco Never Used    Labs: Recent Review Flowsheet Data    Labs for ITP Cardiac and Pulmonary Rehab Latest Ref Rng & Units 06/14/2007 01/05/2011 03/17/2014 02/06/2016 02/06/2016   Cholestrol 0 - 200 mg/dL - 122 115 - -   LDLCALC 0 - 99 mg/dL - 53 49 - -   HDL >39.00 mg/dL - 44.50 37.20(L) - -   Trlycerides 0.0 - 149.0 mg/dL - 124.0 142.0 - -   PHART 7.350 - 7.450 - - - - 7.336(L)   PCO2ART  35.0 - 45.0 mmHg - - - - 44.4   HCO3 20.0 - 24.0 mEq/L 23.3 - - 23.2 23.8   TCO2 0 - 100 mmol/L 24 - - 25 25   ACIDBASEDEF 0.0 - 2.0 mmol/L 2.0 - - 3.0(H) 2.0   O2SAT % - - - 72.0 97.0      Capillary Blood Glucose: No results found for: GLUCAP   Pulmonary Assessment Scores: Pulmonary Assessment Scores    Row Name 04/11/17 0704         ADL UCSD   ADL Phase  Entry       mMRC Score   mMRC Score  1        Pulmonary Function Assessment: Pulmonary Function Assessment - 04/05/17 1010      Breath   Bilateral Breath Sounds  Other    Other  course crackles in bases bilaterally    Shortness of Breath  Yes;Limiting activity       Exercise Target Goals:    Exercise Program Goal: Individual exercise prescription set using results from initial 6 min walk test and THRR while considering   patient's activity barriers and safety.    Exercise Prescription Goal: Initial exercise prescription builds to 30-45 minutes a day of aerobic activity, 2-3 days per week.  Home exercise guidelines will be given to patient during program as part of exercise prescription that the participant will acknowledge.  Activity Barriers & Risk Stratification: Activity Barriers & Cardiac Risk Stratification - 04/05/17 0956      Activity Barriers & Cardiac Risk Stratification   Activity Barriers  Shortness of Breath;Arthritis       6 Minute Walk: 6 Minute Walk    Row Name 04/11/17 0700         6 Minute Walk   Phase  Initial     Distance  1540 feet     Walk Time  6 minutes     # of Rest Breaks  0     MPH  2.91     METS  3.22     RPE  13     Perceived Dyspnea   3     Symptoms  No     Resting HR  72 bpm     Resting BP  165/73     Resting Oxygen Saturation   95 %     Exercise Oxygen Saturation  during 6 min walk  90 %     Max Ex. HR  121 bpm     Max Ex. BP  220/78     2 Minute Post BP  175/77       Interval HR   1 Minute HR  97     2 Minute HR  100     3 Minute HR  107     4 Minute HR  121     5 Minute HR  114     6 Minute HR  119     2 Minute Post HR  119     Interval Heart Rate?  Yes       Interval Oxygen   Interval Oxygen?  Yes     Baseline Oxygen Saturation %  95 %     1 Minute Oxygen Saturation %  94 %     1 Minute Liters of Oxygen  0 L     2 Minute Oxygen Saturation %  91 %     2 Minute Liters of Oxygen  0 L  3 Minute Oxygen Saturation %  91 %     3 Minute Liters of Oxygen  0 L     4 Minute Oxygen Saturation %  90 %     4 Minute Liters of Oxygen  0 L     5 Minute Oxygen Saturation %  91 %     5 Minute Liters of Oxygen  0 L     6 Minute Oxygen Saturation %  90 %     6 Minute Liters of Oxygen  0 L     2 Minute Post Oxygen Saturation %  91 %     2 Minute Post Liters of Oxygen  0 L        Oxygen Initial Assessment: Oxygen Initial Assessment - 04/11/17 0704       Initial 6 min Walk   Oxygen Used  None      Program Oxygen Prescription   Program Oxygen Prescription  None       Oxygen Re-Evaluation: Oxygen Re-Evaluation    Row Name 04/26/17 1152 05/20/17 1154 06/10/17 1757 07/04/17 0645 07/29/17 1552     Program Oxygen Prescription   Program Oxygen Prescription  None  None  None  None  None     Home Oxygen   Home Oxygen Device  None  None  None  None  None   Sleep Oxygen Prescription  None  None  None  None  None   Home Exercise Oxygen Prescription  None  None  None  None  None   Home at Rest Exercise Oxygen Prescription  None  None  None  None  None      Oxygen Discharge (Final Oxygen Re-Evaluation): Oxygen Re-Evaluation - 07/29/17 1552      Program Oxygen Prescription   Program Oxygen Prescription  None      Home Oxygen   Home Oxygen Device  None    Sleep Oxygen Prescription  None    Home Exercise Oxygen Prescription  None    Home at Rest Exercise Oxygen Prescription  None       Initial Exercise Prescription: Initial Exercise Prescription - 04/11/17 0700      Date of Initial Exercise RX and Referring Provider   Date  04/09/17    Referring Provider  Dr. Nelda Marseille      Bike   Level  0.4    Minutes  17      NuStep   Level  2    Minutes  17    METs  1.5      Track   Laps  10    Minutes  17      Prescription Details   Frequency (times per week)  2      Intensity   THRR 40-80% of Max Heartrate  54-107    Ratings of Perceived Exertion  11-13    Perceived Dyspnea  0-4      Progression   Progression  Continue progressive overload as per policy without signs/symptoms or physical distress.      Resistance Training   Training Prescription  Yes    Weight  orange bands    Reps  10-15       Perform Capillary Blood Glucose checks as needed.  Exercise Prescription Changes: Exercise Prescription Changes    Row Name 04/16/17 1300 04/18/17 1200 04/23/17 1200 04/25/17 1200 04/30/17 1200     Response to Exercise    Blood Pressure (Admit)  120/62  118/56  100/52  124/52  136/64   Blood Pressure (Exercise)  170/64  104/50  152/64  156/60  160/70   Blood Pressure (Exit)  120/60  100/50  104/62  118/70  126/66   Heart Rate (Admit)  63 bpm  47 bpm  52 bpm  56 bpm  59 bpm   Heart Rate (Exercise)  83 bpm  71 bpm  71 bpm  76 bpm  80 bpm   Heart Rate (Exit)  71 bpm  53 bpm  53 bpm  58 bpm  66 bpm   Oxygen Saturation (Admit)  97 %  98 %  96 %  95 %  96 %   Oxygen Saturation (Exercise)  95 %  91 %  91 %  92 %  93 %   Oxygen Saturation (Exit)  97 %  97 %  97 %  97 %  96 %   Rating of Perceived Exertion (Exercise)  13  15  12  13  13    Perceived Dyspnea (Exercise)  2  1  2  3  2    Duration  Continue with 45 min of aerobic exercise without signs/symptoms of physical distress.  Continue with 45 min of aerobic exercise without signs/symptoms of physical distress.  Continue with 45 min of aerobic exercise without signs/symptoms of physical distress.  Continue with 45 min of aerobic exercise without signs/symptoms of physical distress.  Continue with 45 min of aerobic exercise without signs/symptoms of physical distress.   Intensity  THRR unchanged  THRR unchanged  THRR unchanged  THRR unchanged  THRR unchanged     Progression   Progression  Continue to progress workloads to maintain intensity without signs/symptoms of physical distress.  Continue to progress workloads to maintain intensity without signs/symptoms of physical distress.  Continue to progress workloads to maintain intensity without signs/symptoms of physical distress.  Continue to progress workloads to maintain intensity without signs/symptoms of physical distress.  Continue to progress workloads to maintain intensity without signs/symptoms of physical distress.     Resistance Training   Training Prescription  Yes  Yes  Yes  Yes  Yes   Weight  orange bands  orange bands  orange bands  orange bands  orange bands   Reps  10-15  10-15  10-15  10-15  10-15      Bike   Level  0.6  0.6  0.8  0.8  0.8   Minutes  17  17  17  17  17      NuStep   Level  -  2  3  -  3   Minutes  -  17  17  -  17   METs  -  2.1  2.4  -  2.7     Track   Laps  12  14  12  16  13    Minutes  17  17  17  17  17    Row Name 05/02/17 1200 05/07/17 1200 05/09/17 1246 05/14/17 1200 05/16/17 1327     Response to Exercise   Blood Pressure (Admit)  120/60  118/56  104/50  106/60  118/54   Blood Pressure (Exercise)  158/64  130/60  160/58  150/80  154/70   Blood Pressure (Exit)  110/70  104/68  116/66  116/68  138/62   Heart Rate (Admit)  52 bpm  59 bpm  57 bpm  65 bpm  59 bpm   Heart Rate (Exercise)  90 bpm  88 bpm  81 bpm  82 bpm  118 bpm   Heart Rate (Exit)  69 bpm  63 bpm  57 bpm  60 bpm  80 bpm   Oxygen Saturation (Admit)  96 %  96 %  94 %  98 %  95 %   Oxygen Saturation (Exercise)  94 %  92 %  90 %  90 %  90 %   Oxygen Saturation (Exit)  98 %  96 %  96 %  97 %  93 %   Rating of Perceived Exertion (Exercise)  12  13  13  13  13    Perceived Dyspnea (Exercise)  1  2  2  2  2    Duration  Continue with 45 min of aerobic exercise without signs/symptoms of physical distress.  Continue with 45 min of aerobic exercise without signs/symptoms of physical distress.  Continue with 45 min of aerobic exercise without signs/symptoms of physical distress.  Continue with 45 min of aerobic exercise without signs/symptoms of physical distress.  Continue with 45 min of aerobic exercise without signs/symptoms of physical distress.   Intensity  THRR unchanged  THRR unchanged  THRR unchanged  THRR unchanged  THRR unchanged     Progression   Progression  Continue to progress workloads to maintain intensity without signs/symptoms of physical distress.  Continue to progress workloads to maintain intensity without signs/symptoms of physical distress.  Continue to progress workloads to maintain intensity without signs/symptoms of physical distress.  Continue to progress workloads to maintain intensity  without signs/symptoms of physical distress.  Continue to progress workloads to maintain intensity without signs/symptoms of physical distress.     Resistance Training   Training Prescription  Yes  Yes  Yes  Yes  Yes   Weight  orange bands  orange bands  orange bands  orange bands  orange bands   Reps  10-15  10-15  10-15  10-15  10-15   Time  10 Minutes  10 Minutes  10 Minutes  10 Minutes  10 Minutes     Bike   Level  1  1  1   1.3  -   Minutes  17  17  17  17   -     NuStep   Level  4  4  -  5  5   Minutes  17  17  -  17  17   METs  3.2  3  -  3.9  3.5     Track   Laps  -  13  15  16  17    Minutes  -  17  17  17  17    Row Name 05/28/17 1211 06/04/17 1200 06/13/17 1200 06/20/17 1229 06/25/17 1200     Response to Exercise   Blood Pressure (Admit)  110/50  138/70  120/62  118/62  110/56   Blood Pressure (Exercise)  152/64  130/50  154/78  130/52  160/70   Blood Pressure (Exit)  106/62  118/60  120/60  110/58  122/70   Heart Rate (Admit)  51 bpm  76 bpm  64 bpm  50 bpm  73 bpm   Heart Rate (Exercise)  86 bpm  122 bpm  97 bpm  88 bpm  118 bpm   Heart Rate (Exit)  61 bpm  79 bpm  71 bpm  65 bpm  86 bpm   Oxygen Saturation (Admit)  97 %  95 %  97 %  97 %  97 %  Oxygen Saturation (Exercise)  89 %  93 %  90 %  91 %  90 %   Oxygen Saturation (Exit)  96 %  96 %  95 %  97 %  94 %   Rating of Perceived Exertion (Exercise)  12  13  13  13  11    Perceived Dyspnea (Exercise)  1  3  2  2  2    Duration  Continue with 45 min of aerobic exercise without signs/symptoms of physical distress.  Continue with 45 min of aerobic exercise without signs/symptoms of physical distress.  Continue with 45 min of aerobic exercise without signs/symptoms of physical distress.  Continue with 45 min of aerobic exercise without signs/symptoms of physical distress.  Continue with 45 min of aerobic exercise without signs/symptoms of physical distress.   Intensity  THRR unchanged  THRR unchanged  THRR unchanged  THRR  unchanged  THRR unchanged     Progression   Progression  Continue to progress workloads to maintain intensity without signs/symptoms of physical distress.  Continue to progress workloads to maintain intensity without signs/symptoms of physical distress.  Continue to progress workloads to maintain intensity without signs/symptoms of physical distress.  Continue to progress workloads to maintain intensity without signs/symptoms of physical distress.  Continue to progress workloads to maintain intensity without signs/symptoms of physical distress.     Resistance Training   Training Prescription  Yes  Yes  Yes  Yes  Yes   Weight  orange bands  orange bands  orange bands  orange bands  orange bands   Reps  10-15  10-15  10-15  10-15  10-15   Time  10 Minutes  10 Minutes  10 Minutes  10 Minutes  10 Minutes     Bike   Level  1.3  1.3 HR increased with level will decresce back to 1.0  1.3 HR increased with level will decresce back to 1.0  1.3 HR increased with level will decresce back to 1.0  1.3   Minutes  17  17  17  17  17      NuStep   Level  5  5  -  5  5   Minutes  17  17  -  17  17   METs  -  3.2  -  3.4  -     Track   Laps  15  17  16  16  16    Minutes  17  17  17  17  17    Row Name 06/27/17 1300 07/04/17 1222 07/11/17 1335 07/16/17 1200 07/18/17 1200     Response to Exercise   Blood Pressure (Admit)  114/70  132/60  130/64  110/40  124/54   Blood Pressure (Exercise)  176/72  170/80  160/70  144/60  154/54   Blood Pressure (Exit)  124/72  116/64  110/60  110/60  100/60   Heart Rate (Admit)  54 bpm  60 bpm  55 bpm  50 bpm  61 bpm   Heart Rate (Exercise)  88 bpm  84 bpm  80 bpm  87 bpm  93 bpm   Heart Rate (Exit)  62 bpm  57 bpm  54 bpm  64 bpm  63 bpm   Oxygen Saturation (Admit)  97 %  97 %  97 %  96 %  95 %   Oxygen Saturation (Exercise)  91 %  92 %  92 %  90 %  91 %  Oxygen Saturation (Exit)  95 %  97 %  97 %  98 %  97 %   Rating of Perceived Exertion (Exercise)  11  12  12  12   12    Perceived Dyspnea (Exercise)  2  2  1  1  2    Duration  Continue with 45 min of aerobic exercise without signs/symptoms of physical distress.  Continue with 45 min of aerobic exercise without signs/symptoms of physical distress.  Continue with 45 min of aerobic exercise without signs/symptoms of physical distress.  Continue with 45 min of aerobic exercise without signs/symptoms of physical distress.  Continue with 45 min of aerobic exercise without signs/symptoms of physical distress.   Intensity  THRR unchanged  THRR unchanged  THRR unchanged  THRR unchanged  THRR unchanged     Progression   Progression  Continue to progress workloads to maintain intensity without signs/symptoms of physical distress.  Continue to progress workloads to maintain intensity without signs/symptoms of physical distress.  Continue to progress workloads to maintain intensity without signs/symptoms of physical distress.  Continue to progress workloads to maintain intensity without signs/symptoms of physical distress.  Continue to progress workloads to maintain intensity without signs/symptoms of physical distress.     Resistance Training   Training Prescription  Yes  Yes  Yes  Yes  Yes   Weight  orange bands  orange bands  orange bands  orange bands  orange bands   Reps  10-15  10-15  10-15  10-15  10-15   Time  10 Minutes  10 Minutes  10 Minutes  10 Minutes  10 Minutes     Bike   Level  1.3  1.3  1.3  1.3  -   Minutes  17  17  17  17   -     NuStep   Level  5  5  5  5  5    Minutes  17  17  17  17  17    METs  3.7  3.8  3.6  2.8  3.1     Track   Laps  -  -  -  17  17   Minutes  -  -  -  17  17     Home Exercise Plan   Plans to continue exercise at  -  -  -  Home (comment)  -   Frequency  -  -  -  Add 3 additional days to program exercise sessions.  -   Row Name 07/23/17 1200 07/25/17 1221           Response to Exercise   Blood Pressure (Admit)  110/50  116/44      Blood Pressure (Exercise)  174/66   166/80      Blood Pressure (Exit)  124/70  112/60      Heart Rate (Admit)  88 bpm  54 bpm      Heart Rate (Exercise)  105 bpm  89 bpm      Heart Rate (Exit)  73 bpm  55 bpm      Oxygen Saturation (Admit)  96 %  95 %      Oxygen Saturation (Exercise)  89 %  91 %      Oxygen Saturation (Exit)  97 %  96 %      Rating of Perceived Exertion (Exercise)  12  14      Perceived Dyspnea (Exercise)  2  2  Duration  Continue with 45 min of aerobic exercise without signs/symptoms of physical distress.  Continue with 45 min of aerobic exercise without signs/symptoms of physical distress.      Intensity  THRR unchanged  THRR unchanged        Progression   Progression  Continue to progress workloads to maintain intensity without signs/symptoms of physical distress.  Continue to progress workloads to maintain intensity without signs/symptoms of physical distress.        Resistance Training   Training Prescription  Yes  Yes      Weight  orange bands  orange bands      Reps  10-15  10-15      Time  10 Minutes  10 Minutes        Bike   Level  1.3  1.3      Minutes  17  17        NuStep   Level  5  -      Minutes  17  -      METs  3.9  -        Track   Laps  18  18      Minutes  17  17         Exercise Comments:   Exercise Goals and Review: Exercise Goals    Row Name 04/05/17 0958             Exercise Goals   Increase Physical Activity  Yes       Intervention  Provide advice, education, support and counseling about physical activity/exercise needs.;Develop an individualized exercise prescription for aerobic and resistive training based on initial evaluation findings, risk stratification, comorbidities and participant's personal goals.       Expected Outcomes  Achievement of increased cardiorespiratory fitness and enhanced flexibility, muscular endurance and strength shown through measurements of functional capacity and personal statement of participant.       Increase Strength and  Stamina  Yes       Intervention  Provide advice, education, support and counseling about physical activity/exercise needs.;Develop an individualized exercise prescription for aerobic and resistive training based on initial evaluation findings, risk stratification, comorbidities and participant's personal goals.       Expected Outcomes  Achievement of increased cardiorespiratory fitness and enhanced flexibility, muscular endurance and strength shown through measurements of functional capacity and personal statement of participant.       Able to understand and use rate of perceived exertion (RPE) scale  Yes       Intervention  Provide education and explanation on how to use RPE scale       Expected Outcomes  Short Term: Able to use RPE daily in rehab to express subjective intensity level;Long Term:  Able to use RPE to guide intensity level when exercising independently       Able to understand and use Dyspnea scale  Yes       Intervention  Provide education and explanation on how to use Dyspnea scale       Expected Outcomes  Short Term: Able to use Dyspnea scale daily in rehab to express subjective sense of shortness of breath during exertion;Long Term: Able to use Dyspnea scale to guide intensity level when exercising independently       Knowledge and understanding of Target Heart Rate Range (THRR)  Yes       Intervention  Provide education and explanation of THRR including how the numbers were predicted and where  they are located for reference       Expected Outcomes  Short Term: Able to state/look up THRR;Long Term: Able to use THRR to govern intensity when exercising independently;Short Term: Able to use daily as guideline for intensity in rehab       Understanding of Exercise Prescription  Yes       Intervention  Provide education, explanation, and written materials on patient's individual exercise prescription       Expected Outcomes  Short Term: Able to explain program exercise prescription;Long  Term: Able to explain home exercise prescription to exercise independently          Exercise Goals Re-Evaluation : Exercise Goals Re-Evaluation    Row Name 04/29/17 1010 05/23/17 1640 06/10/17 1627 06/25/17 1632 07/29/17 1552     Exercise Goal Re-Evaluation   Exercise Goals Review  Increase Strength and Stamina;Able to understand and use Dyspnea scale;Able to understand and use rate of perceived exertion (RPE) scale;Knowledge and understanding of Target Heart Rate Range (THRR);Understanding of Exercise Prescription;Increase Physical Activity  Increase Strength and Stamina;Increase Physical Activity;Able to understand and use Dyspnea scale;Able to understand and use rate of perceived exertion (RPE) scale;Knowledge and understanding of Target Heart Rate Range (THRR);Understanding of Exercise Prescription  Increase Strength and Stamina;Increase Physical Activity;Able to understand and use Dyspnea scale;Able to understand and use rate of perceived exertion (RPE) scale;Knowledge and understanding of Target Heart Rate Range (THRR);Understanding of Exercise Prescription  Increase Strength and Stamina;Increase Physical Activity;Able to understand and use Dyspnea scale;Able to understand and use rate of perceived exertion (RPE) scale;Knowledge and understanding of Target Heart Rate Range (THRR);Understanding of Exercise Prescription  Increase Strength and Stamina;Increase Physical Activity;Able to understand and use Dyspnea scale;Able to understand and use rate of perceived exertion (RPE) scale;Knowledge and understanding of Target Heart Rate Range (THRR);Understanding of Exercise Prescription   Comments  Patient has only attended 4 sessions. Will cont. to monitor and progress as able.   Patient has been progressing well in the program. Walking 16 laps (200 ft each) in 15 minutes. Will go over home exercise in the nexts few weeks. Will cont. to monitor.   Patient has been progressing well in the program. Walking 17  laps (200 ft each) in 15 minutes. Will go over home exercise in the nexts few weeks. Will cont. to monitor.   Patient has been progressing well in the program. Walking 17 laps (200 ft each) in 15 minutes. Will go over home exercise in the nexts few weeks. Will cont. to monitor.   Patient has been progressing well in the program. Walking 18 laps (200 ft each) in 15 minutes. Home exercise reviewed. Will cont. to monitor.    Expected Outcomes  Through exercise at rehab and at home, patient will increase strength and stamina and decrease shortness of breath.  Through exercise at rehab and at home, patient will increase strength and stamina making ADL's easier to perform. Patient will also have a better understanding of safe exercise and what they are capable to do outside of clinical supervision.  Through exercise at rehab and at home, patient will increase strength and stamina making ADL's easier to perform. Patient will also have a better understanding of safe exercise and what they are capable to do outside of clinical supervision.  Through exercise at rehab and at home, patient will increase strength and stamina making ADL's easier to perform. Patient will also have a better understanding of safe exercise and what they are capable to do outside  of clinical supervision.  Through exercise at rehab and at home, patient will increase strength and stamina making ADL's easier to perform. Patient will also have a better understanding of safe exercise and what they are capable to do outside of clinical supervision.      Discharge Exercise Prescription (Final Exercise Prescription Changes): Exercise Prescription Changes - 07/25/17 1221      Response to Exercise   Blood Pressure (Admit)  116/44    Blood Pressure (Exercise)  166/80    Blood Pressure (Exit)  112/60    Heart Rate (Admit)  54 bpm    Heart Rate (Exercise)  89 bpm    Heart Rate (Exit)  55 bpm    Oxygen Saturation (Admit)  95 %    Oxygen Saturation  (Exercise)  91 %    Oxygen Saturation (Exit)  96 %    Rating of Perceived Exertion (Exercise)  14    Perceived Dyspnea (Exercise)  2    Duration  Continue with 45 min of aerobic exercise without signs/symptoms of physical distress.    Intensity  THRR unchanged      Progression   Progression  Continue to progress workloads to maintain intensity without signs/symptoms of physical distress.      Resistance Training   Training Prescription  Yes    Weight  orange bands    Reps  10-15    Time  10 Minutes      Bike   Level  1.3    Minutes  17      Track   Laps  18    Minutes  17       Nutrition:  Target Goals: Understanding of nutrition guidelines, daily intake of sodium <1592m, cholesterol <2099m calories 30% from fat and 7% or less from saturated fats, daily to have 5 or more servings of fruits and vegetables.  Biometrics: Pre Biometrics - 04/05/17 1000      Pre Biometrics   Grip Strength  26 kg        Nutrition Therapy Plan and Nutrition Goals: Nutrition Therapy & Goals - 04/18/17 1421      Nutrition Therapy   Diet  Therapeutic Lifestyle Changes      Personal Nutrition Goals   Nutrition Goal  Identify food quantities necessary to achieve wt loss of  -2# per week to a goal wt loss of 2.7-10.9 kg (6-24 lb) at graduation from pulmonary rehab.      Intervention Plan   Intervention  Prescribe, educate and counsel regarding individualized specific dietary modifications aiming towards targeted core components such as weight, hypertension, lipid management, diabetes, heart failure and other comorbidities.    Expected Outcomes  Short Term Goal: Understand basic principles of dietary content, such as calories, fat, sodium, cholesterol and nutrients.;Long Term Goal: Adherence to prescribed nutrition plan.       Nutrition Assessments: Nutrition Assessments - 07/04/17 1158      Rate Your Plate Scores   Pre Score  48       Nutrition Goals Re-Evaluation:   Nutrition  Goals Discharge (Final Nutrition Goals Re-Evaluation):   Psychosocial: Target Goals: Acknowledge presence or absence of significant depression and/or stress, maximize coping skills, provide positive support system. Participant is able to verbalize types and ability to use techniques and skills needed for reducing stress and depression.  Initial Review & Psychosocial Screening: Initial Psych Review & Screening - 04/05/17 1011      Initial Review   Current issues with  None Identified  Family Dynamics   Good Support System?  Yes      Barriers   Psychosocial barriers to participate in program  There are no identifiable barriers or psychosocial needs.      Screening Interventions   Interventions  Encouraged to exercise       Quality of Life Scores:  Scores of 19 and below usually indicate a poorer quality of life in these areas.  A difference of  2-3 points is a clinically meaningful difference.  A difference of 2-3 points in the total score of the Quality of Life Index has been associated with significant improvement in overall quality of life, self-image, physical symptoms, and general health in studies assessing change in quality of life.   PHQ-9: Recent Review Flowsheet Data    Depression screen [facility] 2/9 04/05/2017   Decreased Interest 0   Down, Depressed, Hopeless 0   PHQ - 2 Score 0     Interpretation of Total Score  Total Score Depression Severity:  1-4 = Minimal depression, 5-9 = Mild depression, 10-14 = Moderate depression, 15-19 = Moderately severe depression, 20-27 = Severe depression   Psychosocial Evaluation and Intervention: Psychosocial Evaluation - 04/05/17 1014      Psychosocial Evaluation & Interventions   Interventions  Encouraged to exercise with the program and follow exercise prescription    Expected Outcomes  patient will remain free from psychosocial barriers to participation    Continue Psychosocial Services   No Follow up required        Psychosocial Re-Evaluation: Psychosocial Re-Evaluation    [place] Name 04/26/17 1159 05/20/17 1201 06/10/17 1810 07/04/17 0645 07/29/17 1226     Psychosocial Re-Evaluation   Current issues with  None Identified  None Identified  None Identified  None Identified  None Identified   Expected Outcomes  patient will remain free from psychosocial barriers to participation in pulmonary rehab  patient will remain free from psychosocial barriers to participation in pulmonary rehab  patient will remain free from psychosocial barriers to participation in pulmonary rehab  patient will remain free from psychosocial barriers to participation in pulmonary rehab  patient will remain free from psychosocial barriers to participation in pulmonary rehab   Continue Psychosocial Services   No Follow up required  No Follow up required  No Follow up required  No Follow up required  No Follow up required      Psychosocial Discharge (Final Psychosocial Re-Evaluation): Psychosocial Re-Evaluation - 07/29/17 1226      Psychosocial Re-Evaluation   Current issues with  None Identified    Expected Outcomes  patient will remain free from psychosocial barriers to participation in pulmonary rehab    Continue Psychosocial Services   No Follow up required       Education: Education Goals: Education classes will be provided on a weekly basis, covering required topics. Participant will state understanding/return demonstration of topics presented.  Learning Barriers/Preferences: Learning Barriers/Preferences - 04/05/17 1009      Learning Barriers/Preferences   Learning Barriers  None    Learning Preferences  Written Material;Verbal Instruction;Group Instruction;Computer/Internet       Education Topics: Risk Factor Reduction:  -Group instruction that is supported by a PowerPoint presentation. Instructor discusses the definition of a risk factor, different risk factors for pulmonary disease, and how the heart and lungs  work together.     Nutrition for Pulmonary Patient:  -Group instruction provided by PowerPoint slides, verbal discussion, and written materials to support subject matter. The instructor gives an explanation and  review of healthy diet recommendations, which includes a discussion on weight management, recommendations for fruit and vegetable consumption, as well as protein, fluid, caffeine, fiber, sodium, sugar, and alcohol. Tips for eating when patients are short of breath are discussed.   PULMONARY REHAB OTHER RESPIRATORY from 07/25/2017 in [place]  Date  07/11/17  Educator  edna  Instruction Review Code  2- meets goals/outcomes      Pursed Lip Breathing:  -Group instruction that is supported by demonstration and informational handouts. Instructor discusses the benefits of pursed lip and diaphragmatic breathing and detailed demonstration on how to preform both.     Oxygen Safety:  -Group instruction provided by PowerPoint, verbal discussion, and written material to support subject matter. There is an overview of "What is Oxygen" and "Why do we need it".  Instructor also reviews how to create a safe environment for oxygen use, the importance of using oxygen as prescribed, and the risks of noncompliance. There is a brief discussion on traveling with oxygen and resources the patient may utilize.   PULMONARY REHAB OTHER RESPIRATORY from 07/25/2017 in [place]  Date  05/02/17  Educator  Truddie Crumble  Instruction Review Code  2- meets goals/outcomes      Oxygen Equipment:  -Group instruction provided by [employer] Staff utilizing handouts, written materials, and equipment demonstrations.   Signs and Symptoms:  -Group instruction provided by written material and verbal discussion to support subject matter. Warning signs and symptoms of infection, stroke, and heart attack are reviewed and when to call the physician/911 reinforced.  Tips for preventing the spread of infection discussed.   Advanced Directives:  -Group instruction provided by verbal instruction and written material to support subject matter. Instructor reviews Advanced Directive laws and proper instruction for filling out document.   Pulmonary Video:  -Group video education that reviews the importance of medication and oxygen compliance, exercise, good nutrition, pulmonary hygiene, and pursed lip and diaphragmatic breathing for the pulmonary patient.   Exercise for the Pulmonary Patient:  -Group instruction that is supported by a PowerPoint presentation. Instructor discusses benefits of exercise, core components of exercise, frequency, duration, and intensity of an exercise routine, importance of utilizing pulse oximetry during exercise, safety while exercising, and options of places to exercise outside of rehab.     Pulmonary Medications:  -Verbally interactive group education provided by instructor with focus on inhaled medications and proper administration.   PULMONARY REHAB OTHER RESPIRATORY from 07/25/2017 in [place]  Date  07/25/17  Educator  Pharm D  Instruction Review Code  2- meets goals/outcomes      Anatomy and Physiology of the Respiratory System and Intimacy:  -Group instruction provided by PowerPoint, verbal discussion, and written material to support subject matter. Instructor reviews respiratory cycle and anatomical components of the respiratory system and their functions. Instructor also reviews differences in obstructive and restrictive respiratory diseases with examples of each. Intimacy, Sex, and Sexuality differences are reviewed with a discussion on how relationships can change when diagnosed with pulmonary disease. Common sexual concerns are reviewed.   PULMONARY REHAB OTHER RESPIRATORY from 07/25/2017 in [place]  Date  06/20/17  Educator  RN  Instruction Review  Code  2- meets goals/outcomes      MD DAY -A group question and answer session with a medical doctor that allows participants to ask questions that relate to their pulmonary disease state.   PULMONARY REHAB  OTHER RESPIRATORY from 07/25/2017 in [place]  Date  07/18/17  Educator  Dr. Nelda Marseille  Instruction Review Code  R- Review/reinforce      OTHER EDUCATION -Group or individual verbal, written, or video instructions that support the educational goals of the pulmonary rehab program.   Knowledge Questionnaire Score:   Core Components/Risk Factors/Patient Goals at Admission: Personal Goals and Risk Factors at Admission - 04/05/17 1011      Core Components/Risk Factors/Patient Goals on Admission   Improve shortness of breath with ADL's  Yes    Intervention  Provide education, individualized exercise plan and daily activity instruction to help decrease symptoms of SOB with activities of daily living.    Expected Outcomes  Short Term: Achieves a reduction of symptoms when performing activities of daily living.    Develop more efficient breathing techniques such as purse lipped breathing and diaphragmatic breathing; and practicing self-pacing with activity  Yes    Intervention  Provide education, demonstration and support about specific breathing techniuqes utilized for more efficient breathing. Include techniques such as pursed lipped breathing, diaphragmatic breathing and self-pacing activity.    Expected Outcomes  Short Term: Participant will be able to demonstrate and use breathing techniques as needed throughout daily activities.    Increase knowledge of respiratory medications and ability to use respiratory devices properly   Yes    Intervention  Provide education and demonstration as needed of appropriate use of medications, inhalers, and oxygen therapy.    Expected Outcomes  Short Term: Achieves understanding of medications use. Understands that oxygen  is a medication prescribed by physician. Demonstrates appropriate use of inhaler and oxygen therapy.       Core Components/Risk Factors/Patient Goals Review:  Goals and Risk Factor Review    Row Name 04/26/17 1154 05/20/17 1154 06/10/17 1757 07/04/17 0645 07/29/17 1221     Core Components/Risk Factors/Patient Goals Review   Personal Goals Review  Develop more efficient breathing techniques such as purse lipped breathing and diaphragmatic breathing and practicing self-pacing with activity.;Improve shortness of breath with ADL's;Increase knowledge of respiratory medications and ability to use respiratory devices properly.  Develop more efficient breathing techniques such as purse lipped breathing and diaphragmatic breathing and practicing self-pacing with activity.;Improve shortness of breath with ADL's;Increase knowledge of respiratory medications and ability to use respiratory devices properly.  Develop more efficient breathing techniques such as purse lipped breathing and diaphragmatic breathing and practicing self-pacing with activity.;Improve shortness of breath with ADL's;Increase knowledge of respiratory medications and ability to use respiratory devices properly.  Develop more efficient breathing techniques such as purse lipped breathing and diaphragmatic breathing and practicing self-pacing with activity.;Improve shortness of breath with ADL's;Increase knowledge of respiratory medications and ability to use respiratory devices properly.  Develop more efficient breathing techniques such as purse lipped breathing and diaphragmatic breathing and practicing self-pacing with activity.;Improve shortness of breath with ADL's;Increase knowledge of respiratory medications and ability to use respiratory devices properly.   Review  patient has only attended 4 sessions since admission and it is too early to evaluate progress towards goals  patient is doing well in pulmonary rehab. He has attended an education  session with the pharmacist on pulmonary medications and has been observed utilizing PLB without prompting during exercise  patient continues to progress well in pulmonary rehab. He feels his shortness of breath is improving with his ADLs and he is observed utilizing PLB during exertion. Expect to see further progression over the next 30 days.  patient  continues to progress well in pulmonary rehab. He feels his shortness of breath is improving with his ADLs and he is observed utilizing PLB during exertion. Expect to see further progression over the next 30 days.  Patient is doing well in pulmonary rehab. Continues to tolerate workload increases. Patient has met all of his pulmonary rehab goals however he has requested an extension from the [place] since the department was close on Christmas Day and New Years Day. [place] authorized extension and patient will continue in pulmonary rehab until Jan 31. He as attended 2 education sessios on pulmonary medications, states his SOB has improved significantly, and he is observed using PLB during exertion.   Expected Outcomes  see admission expected outcomes.  see admission expected outcomes.  see admission expected outcomes.  see admission expected outcomes.  see admission expected outcomes.      Core Components/Risk Factors/Patient Goals at Discharge (Final Review):  Goals and Risk Factor Review - 07/29/17 1221      Core Components/Risk Factors/Patient Goals Review   Personal Goals Review  Develop more efficient breathing techniques such as purse lipped breathing and diaphragmatic breathing and practicing self-pacing with activity.;Improve shortness of breath with ADL's;Increase knowledge of respiratory medications and ability to use respiratory devices properly.    Review  Patient is doing well in pulmonary rehab. Continues to tolerate workload increases. Patient has met all of his pulmonary rehab goals however he has requested an extension from the [place] since the department  was close on Christmas Day and New Years Day. [place] authorized extension and patient will continue in pulmonary rehab until Jan 31. He as attended 2 education sessios on pulmonary medications, states his SOB has improved significantly, and he is observed using PLB during exertion.    Expected Outcomes  see admission expected outcomes.       ITP Comments:   Comments: Patient has attended 22 pulmonary rehab sessions since admission.

## 2017-07-30 NOTE — Telephone Encounter (Signed)
Form is in brown accordion folder for signature.

## 2017-07-30 NOTE — Telephone Encounter (Signed)
Forms have been placed in RA's look-at for signatures.

## 2017-07-30 NOTE — Telephone Encounter (Signed)
Patient came into clinic today and signed forms; left income verification with forms; forms have been placed in RA folder at the checkout area..Marland Kitchen

## 2017-07-30 NOTE — Telephone Encounter (Signed)
I have the forms. I will work on them when I'm not in triage.

## 2017-07-30 NOTE — Progress Notes (Signed)
Daily Session Note  Patient Details  Name: Patrick Knapp MRN: [id number] Date of Birth: [birth year] Referring Provider:     Pulmonary Rehab Walk Test from 04/09/2017 in [place]  Referring Provider  Dr. Nelda Marseille      Encounter Date: 07/30/2017  Check In: Session Check In - 07/30/17 1049      Check-In   Location  [facility]    Staff Present  Trish Fountain, RN, Maxcine Ham, RN, BSN;Lisa Hughes, RN;Molly diVincenzo, MS, ACSM RCEP, Exercise Physiologist    Supervising physician immediately available to respond to emergencies  [facility] immediately available    Physician(s)  Dr. Zigmund Daniel    Medication changes reported      No    Fall or balance concerns reported     No    Tobacco Cessation  No Change    Warm-up and Cool-down  Performed as group-led instruction    Resistance Training Performed  Yes    VAD Patient?  No      Pain Assessment   Currently in Pain?  No/denies    Multiple Pain Sites  No       Capillary Blood Glucose: No results found for this or any previous visit (from the past 24 hour(s)).  Exercise Prescription Changes - 07/30/17 1200      Response to Exercise   Blood Pressure (Admit)  102/60    Blood Pressure (Exercise)  144/64    Blood Pressure (Exit)  104/76    Heart Rate (Admit)  84 bpm    Heart Rate (Exercise)  96 bpm    Heart Rate (Exit)  77 bpm    Oxygen Saturation (Admit)  96 %    Oxygen Saturation (Exercise)  92 %    Oxygen Saturation (Exit)  96 %    Rating of Perceived Exertion (Exercise)  12    Perceived Dyspnea (Exercise)  1.5    Duration  Continue with 45 min of aerobic exercise without signs/symptoms of physical distress.    Intensity  THRR unchanged      Progression   Progression  Continue to progress workloads to maintain intensity without signs/symptoms of physical distress.      Resistance Training   Training Prescription  Yes    Weight  orange bands    Reps  10-15    Time  10 Minutes      Bike   Level  1.3    Minutes  17      NuStep   Level  5    Minutes  17    METs  3.8      Track   Laps  18    Minutes  17       Social History   Tobacco Use  Smoking Status Never Smoker  Smokeless Tobacco Never Used    Goals Met:  Exercise tolerated well Strength training completed today  Goals Unmet:  Not Applicable  Comments: Service time is from 1030 to 1200    Dr. Rush Farmer is Medical Director for Pulmonary Rehab at [facility]

## 2017-08-01 ENCOUNTER — Encounter (HOSPITAL_COMMUNITY): Payer: Non-veteran care

## 2017-08-02 NOTE — Telephone Encounter (Signed)
RA is back in the office on 08/05/17. Forms will be addressed at that time.

## 2017-08-05 NOTE — Telephone Encounter (Signed)
done

## 2017-08-06 ENCOUNTER — Encounter (HOSPITAL_COMMUNITY)
Admission: RE | Admit: 2017-08-06 | Discharge: 2017-08-06 | Disposition: A | Discharge status: Home / Self Care | Payer: Non-veteran care | Source: Ambulatory Visit | Attending: Pulmonary Disease | Admitting: Pulmonary Disease

## 2017-08-06 VITALS — Wt 201.3 lb

## 2017-08-06 DIAGNOSIS — J8410 Pulmonary fibrosis, unspecified: Secondary | ICD-10-CM

## 2017-08-06 DIAGNOSIS — J84112 Idiopathic pulmonary fibrosis: Secondary | ICD-10-CM | POA: Diagnosis not present

## 2017-08-06 NOTE — Progress Notes (Signed)
Daily Session Note  Patient Details  Name: Patrick Knapp MRN: [id number] Date of Birth: 1928-02-23 Referring Provider:     Pulmonary Rehab Walk Test from 04/09/2017 in [place]  Referring Provider  Dr. Nelda Marseille      Encounter Date: 08/06/2017  Check In: Session Check In - 08/06/17 1211      Check-In   Location  [facility]    Staff Present  Trish Fountain, RN, Maxcine Ham, RN, BSN;Mera Gunkel, RN;Molly diVincenzo, MS, ACSM RCEP, Exercise Physiologist    Supervising physician immediately available to respond to emergencies  [facility] immediately available    Physician(s)  Dr. Bonner Puna    Medication changes reported      No    Fall or balance concerns reported     No    Tobacco Cessation  No Change    Warm-up and Cool-down  Performed as group-led instruction    Resistance Training Performed  Yes    VAD Patient?  No      Pain Assessment   Currently in Pain?  No/denies    Multiple Pain Sites  No       Capillary Blood Glucose: No results found for this or any previous visit (from the past 24 hour(s)).  Exercise Prescription Changes - 08/06/17 1200      Response to Exercise   Blood Pressure (Admit)  110/56    Blood Pressure (Exercise)  140/64    Blood Pressure (Exit)  112/50    Heart Rate (Admit)  52 bpm    Heart Rate (Exercise)  82 bpm    Heart Rate (Exit)  54 bpm    Oxygen Saturation (Admit)  97 %    Oxygen Saturation (Exercise)  92 %    Oxygen Saturation (Exit)  96 %    Rating of Perceived Exertion (Exercise)  12    Perceived Dyspnea (Exercise)  2    Duration  Continue with 45 min of aerobic exercise without signs/symptoms of physical distress.    Intensity  THRR unchanged      Progression   Progression  Continue to progress workloads to maintain intensity without signs/symptoms of physical distress.      Resistance Training   Training Prescription  Yes    Weight  orange bands    Reps  10-15    Time   10 Minutes      Bike   Level  1.3    Minutes  17      NuStep   Level  5    Minutes  17    METs  4.1      Track   Laps  18    Minutes  17       Social History   Tobacco Use  Smoking Status Never Smoker  Smokeless Tobacco Never Used    Goals Met:  Exercise tolerated well No report of cardiac concerns or symptoms Strength training completed today  Goals Unmet:  Not Applicable  Comments: Service time is from 1030 to 1205    Dr. Rush Farmer is Medical Director for Pulmonary Rehab at [facility].

## 2017-08-08 ENCOUNTER — Encounter (HOSPITAL_COMMUNITY)
Admission: RE | Admit: 2017-08-08 | Discharge: 2017-08-08 | Disposition: A | Discharge status: Home / Self Care | Payer: Non-veteran care | Source: Ambulatory Visit | Attending: Pulmonary Disease | Admitting: Pulmonary Disease

## 2017-08-08 DIAGNOSIS — J8410 Pulmonary fibrosis, unspecified: Secondary | ICD-10-CM

## 2017-08-08 DIAGNOSIS — J84112 Idiopathic pulmonary fibrosis: Secondary | ICD-10-CM | POA: Diagnosis not present

## 2017-08-12 ENCOUNTER — Telehealth: Payer: Self-pay | Admitting: Pulmonary Disease

## 2017-08-12 NOTE — Telephone Encounter (Signed)
PA for ofev has been started via C[employer].  Watt ClimesShania with [employer] is aware that PA has been started.  [id number]  Will route to Cherina to f/u on.

## 2017-08-13 ENCOUNTER — Encounter (HOSPITAL_COMMUNITY): Payer: Non-veteran care

## 2017-08-13 NOTE — Telephone Encounter (Signed)
Durel SaltsOfev has been approved until 08/13/19. Pt is aware and voiced his understanding. Nothing further is needed.

## 2017-08-15 ENCOUNTER — Encounter (HOSPITAL_COMMUNITY): Payer: Non-veteran care

## 2017-08-20 ENCOUNTER — Encounter (HOSPITAL_COMMUNITY): Payer: Non-veteran care

## 2017-08-22 ENCOUNTER — Encounter (HOSPITAL_COMMUNITY): Payer: Non-veteran care

## 2017-08-26 ENCOUNTER — Encounter (HOSPITAL_COMMUNITY): Payer: Self-pay

## 2017-08-26 DIAGNOSIS — J8410 Pulmonary fibrosis, unspecified: Secondary | ICD-10-CM

## 2017-08-26 NOTE — Progress Notes (Signed)
Discharge Progress Report  Patient Details  Name: Patrick Knapp MRN: [id number] Date of Birth: [birth year] Referring Provider:     Pulmonary Rehab Walk Test from 04/09/2017 in [place]  Referring Provider  Dr. Nelda Marseille       Number of Visits: 25  Reason for Discharge:  Patient reached a stable level of exercise. Patient independent in their exercise. Patient has met program and personal goals.  Smoking History:  Social History   Tobacco Use  Smoking Status Never Smoker  Smokeless Tobacco Never Used    Diagnosis:  Pulmonary fibrosis ([place])  ADL UCSD: Pulmonary Assessment Scores    Row Name 08/12/17 0745 08/26/17 0833 08/26/17 0842     ADL [facility]SD   ADL Phase  Exit  Exit  Entry   SOB Score total  -  49  37     CAT Score   CAT Score  -  24  -     mMRC Score   mMRC Score  1  -  -   Row Name 08/26/17 0843         ADL [facility]SD   ADL Phase  Exit       CAT Score   CAT Score  17        Initial Exercise Prescription:   Discharge Exercise Prescription (Final Exercise Prescription Changes): Exercise Prescription Changes - 08/06/17 1200      Response to Exercise   Blood Pressure (Admit)  110/56    Blood Pressure (Exercise)  140/64    Blood Pressure (Exit)  112/50    Heart Rate (Admit)  52 bpm    Heart Rate (Exercise)  82 bpm    Heart Rate (Exit)  54 bpm    Oxygen Saturation (Admit)  97 %    Oxygen Saturation (Exercise)  92 %    Oxygen Saturation (Exit)  96 %    Rating of Perceived Exertion (Exercise)  12    Perceived Dyspnea (Exercise)  2    Duration  Continue with 45 min of aerobic exercise without signs/symptoms of physical distress.    Intensity  THRR unchanged      Progression   Progression  Continue to progress workloads to maintain intensity without signs/symptoms of physical distress.      Resistance Training   Training Prescription  Yes    Weight  orange bands    Reps  10-15    Time  10 Minutes      Bike    Level  1.3    Minutes  17      NuStep   Level  5    Minutes  17    METs  4.1      Track   Laps  18    Minutes  17       Functional Capacity: 6 Minute Walk    Row Name 08/12/17 0742         6 Minute Walk   Phase  Initial     Distance  1610 feet     Distance Feet Change  70 ft     Walk Time  6 minutes     # of Rest Breaks  0     MPH  3.04     METS  3.3     RPE  15     Perceived Dyspnea   3     Symptoms  Yes (comment)     Comments  leg pain  Resting HR  77 bpm     Resting BP  112/56     Resting Oxygen Saturation   94 %     Exercise Oxygen Saturation  during 6 min walk  89 %     Max Ex. HR  120 bpm     Max Ex. BP  185/71     2 Minute Post BP  150/74       Interval HR   1 Minute HR  89     2 Minute HR  100     3 Minute HR  112     4 Minute HR  116     5 Minute HR  119     6 Minute HR  120     2 Minute Post HR  89     Interval Heart Rate?  Yes       Interval Oxygen   Interval Oxygen?  Yes     Baseline Oxygen Saturation %  94 %     1 Minute Oxygen Saturation %  94 %     1 Minute Liters of Oxygen  0 L     2 Minute Oxygen Saturation %  91 %     2 Minute Liters of Oxygen  0 L     3 Minute Oxygen Saturation %  90 %     3 Minute Liters of Oxygen  0 L     4 Minute Oxygen Saturation %  90 %     4 Minute Liters of Oxygen  0 L     5 Minute Oxygen Saturation %  89 %     5 Minute Liters of Oxygen  0 L     6 Minute Oxygen Saturation %  89 %     6 Minute Liters of Oxygen  0 L     2 Minute Post Oxygen Saturation %  96 %     2 Minute Post Liters of Oxygen  0 L        Psychological, QOL, Others - Outcomes: PHQ 2/9: Depression screen PHQ 2/9 04/05/2017  Decreased Interest 0  Down, Depressed, Hopeless 0  PHQ - 2 Score 0    Quality of Life:   Personal Goals: Goals established at orientation with interventions provided to work toward goal.    Personal Goals Discharge: Goals and Risk Factor Review    Row Name 07/04/17 0645 07/29/17 1221 08/26/17 1654          Core Components/Risk Factors/Patient Goals Review   Personal Goals Review  Develop more efficient breathing techniques such as purse lipped breathing and diaphragmatic breathing and practicing self-pacing with activity.;Improve shortness of breath with ADL's;Increase knowledge of respiratory medications and ability to use respiratory devices properly.  Develop more efficient breathing techniques such as purse lipped breathing and diaphragmatic breathing and practicing self-pacing with activity.;Improve shortness of breath with ADL's;Increase knowledge of respiratory medications and ability to use respiratory devices properly.  Develop more efficient breathing techniques such as purse lipped breathing and diaphragmatic breathing and practicing self-pacing with activity.;Improve shortness of breath with ADL's;Increase knowledge of respiratory medications and ability to use respiratory devices properly.     Review  patient continues to progress well in pulmonary rehab. He feels his shortness of breath is improving with his ADLs and he is observed utilizing PLB during exertion. Expect to see further progression over the next 30 days.  Patient is doing well in pulmonary rehab. Continues to tolerate workload increases. Patient  has met all of his pulmonary rehab goals however he has requested an extension from the [place] since the department was close on Christmas Day and New Years Day. [place] authorized extension and patient will continue in pulmonary rehab until Jan 31. He as attended 2 education sessios on pulmonary medications, states his SOB has improved significantly, and he is observed using PLB during exertion.  -     Expected Outcomes  see admission expected outcomes.  see admission expected outcomes.  all goals met at discharge        Exercise Goals and Review:   Nutrition & Weight - Outcomes:  Post Biometrics - 08/12/17 0744       Post  Biometrics   Grip Strength  30 kg        Nutrition:   Nutrition Discharge: Nutrition Assessments - 08/26/17 1414      Rate Your Plate Scores   Pre Score  48    Post Score  48       Education Questionnaire Score: Knowledge Questionnaire Score - 08/26/17 0842      Knowledge Questionnaire Score   Pre Score  11/13       Goals reviewed with patient; copy given to patient.

## 2017-08-27 ENCOUNTER — Encounter (HOSPITAL_COMMUNITY): Payer: Non-veteran care

## 2017-08-29 ENCOUNTER — Encounter (HOSPITAL_COMMUNITY): Payer: Non-veteran care

## 2017-09-10 ENCOUNTER — Telehealth: Payer: Self-pay | Admitting: Pulmonary Disease

## 2017-09-10 NOTE — Telephone Encounter (Signed)
Spoke with pt, advised he had an appointment with RA at 8:00 on 3/12. That was the only reason why he may have been called because there are not any notes about a phone call.

## 2017-09-17 ENCOUNTER — Ambulatory Visit: Payer: Non-veteran care | Admitting: Pulmonary Disease

## 2017-09-17 DIAGNOSIS — H6123 Impacted cerumen, bilateral: Secondary | ICD-10-CM | POA: Insufficient documentation

## 2017-09-24 ENCOUNTER — Other Ambulatory Visit (INDEPENDENT_AMBULATORY_CARE_PROVIDER_SITE_OTHER): Payer: Non-veteran care

## 2017-09-24 ENCOUNTER — Encounter: Payer: Self-pay | Admitting: Pulmonary Disease

## 2017-09-24 ENCOUNTER — Ambulatory Visit: Payer: Medicare HMO | Admitting: Pulmonary Disease

## 2017-09-24 VITALS — BP 116/72 | HR 67 | Ht 68.5 in | Wt 201.4 lb

## 2017-09-24 DIAGNOSIS — J84112 Idiopathic pulmonary fibrosis: Secondary | ICD-10-CM

## 2017-09-24 DIAGNOSIS — K219 Gastro-esophageal reflux disease without esophagitis: Secondary | ICD-10-CM | POA: Diagnosis not present

## 2017-09-24 LAB — HEPATIC FUNCTION PANEL
ALBUMIN: 4.1 g/dL (ref 3.5–5.2)
ALT: 15 U/L (ref 0–53)
AST: 16 U/L (ref 0–37)
Alkaline Phosphatase: 60 U/L (ref 39–117)
Bilirubin, Direct: 0.1 mg/dL (ref 0.0–0.3)
TOTAL PROTEIN: 7.5 g/dL (ref 6.0–8.3)
Total Bilirubin: 0.6 mg/dL (ref 0.2–1.2)

## 2017-09-24 NOTE — Patient Instructions (Signed)
Check liver functions today and again in 1 month We will check this again on your follow-up visit in 2 months

## 2017-09-24 NOTE — Assessment & Plan Note (Signed)
Liver tests will be checked today and again in 1 month. He seems to be tolerating ofev well.  We will repeat spirometry in 3 months

## 2017-09-24 NOTE — Assessment & Plan Note (Signed)
Diet controlled.  

## 2017-09-24 NOTE — Progress Notes (Signed)
   Subjective:    Patient ID: Patrick Knapp, male    DOB: April 13, 1928, 84 y.o.   MRN: [id number]  HPI  84 yo male never smokerFor follow-up of  Idiopathic pulmonary fibrosis.  He was started on OFEV slight drop in lung function/FVC and has been on it now for about 6 weeks.  Seems to be tolerating well, no diarrhea or nausea.  Is on full dose. Breathing is stable denies cough or increasing dyspnea. He completed pulmonary rehab and now uses his exercise bike at home for about 30 minutes  Significant tests/ events reviewed  CT chest 2011 showed diffuse right greater than left peripheral and subpleural reticular fibrosis. Ambulatory O2 saturation on room air without desaturation 12/28/2016 ANA , CCP neg  HRCTfibrotic interstitial lung disease with mild honeycombing. Slight basilar predominance and slight asymmetrical involvement of the right lung with some progression since 2011.  PFTs 01/2017 no obs, FEV1 96%, FVC 76% DLCO was noted 57% in 04/2017.  Spirometry 06/2017  slight drop in FVC 68%    Review of Systems Patient denies significant dyspnea,cough, hemoptysis,  chest pain, palpitations, pedal edema, orthopnea, paroxysmal nocturnal dyspnea, lightheadedness, nausea, vomiting, abdominal or  leg pains      Objective:   Physical Exam   Gen. Pleasant, well-nourished, in no distress ENT - no thrush, no post nasal drip Neck: No JVD, no thyromegaly, no carotid bruits Lungs: no use of accessory muscles, no dullness to percussion, bibasal rales no rhonchi  Cardiovascular: Rhythm regular, heart sounds  normal, no murmurs or gallops, no peripheral edema Musculoskeletal: No deformities, no cyanosis or clubbing         Assessment & Plan:

## 2017-09-25 NOTE — Progress Notes (Signed)
Was able to talk to the patient regarding their results.  They verbalized an understanding of what was discussed. No further questions at this time. 

## 2017-10-01 ENCOUNTER — Other Ambulatory Visit: Payer: Self-pay | Admitting: Physician Assistant

## 2017-10-03 ENCOUNTER — Telehealth: Payer: Self-pay | Admitting: Pulmonary Disease

## 2017-10-03 MED ORDER — BENZONATATE 100 MG PO CAPS: 200.0000 mg | ORAL_CAPSULE | Freq: Three times a day (TID) | ORAL | 0 refills | Status: DC | PRN

## 2017-10-03 NOTE — Telephone Encounter (Signed)
Called and spoke with patient advised him of CY response. Nothing further needed.

## 2017-10-03 NOTE — Telephone Encounter (Signed)
Called and spoke with patient, he states that he has been coughing x 1 day. Patient is taking mucinex, cough drops, as well as drinking hot tea with lemon. Patient states this is not helping and wants to know what to do.   CY please advise on behalf of Dr. Vassie LollAlva absence.   Current Outpatient Medications on File Prior to Visit  Medication Sig Dispense Refill  . acetaminophen (TYLENOL) 325 MG tablet Take 2 tablets (650 mg total) by mouth every 6 (six) hours as needed for mild pain (or Fever >/= 101). 40 tablet 0  . ALPRAZolam (XANAX) 0.5 MG tablet Take 0.5 mg by mouth 3 (three) times daily as needed. AS NEEDED FOR ANXIETY    . amLODipine (NORVASC) 2.5 MG tablet Take 1 tablet (2.5 mg total) by mouth daily. 90 tablet 3  . atorvastatin (LIPITOR) 20 MG tablet Take 20 mg by mouth daily.     . cholecalciferol (VITAMIN D) 1000 units tablet Take 1,000 Units by mouth daily.    . clopidogrel (PLAVIX) 75 MG tablet Take 1 tablet (75 mg total) by mouth daily. 30 tablet 11  . doxazosin (CARDURA) 4 MG tablet Take 2 mg by mouth every morning.     . isosorbide mononitrate (IMDUR) 30 MG 24 hr tablet TAKE 1 TABLET (30 MG TOTAL) BY MOUTH DAILY. 90 tablet 1  . magnesium gluconate (MAGONATE) 500 MG tablet Take 500 mg by mouth daily.    . metoprolol tartrate (LOPRESSOR) 50 MG tablet TAKE 1/2 TABLET TWICE DAILY 90 tablet 1  . Nintedanib (OFEV) 150 MG CAPS Take 150 mg by mouth 2 (two) times daily. 60 capsule 12  . nitroGLYCERIN (NITROSTAT) 0.4 MG SL tablet Place 1 tablet (0.4 mg total) under the tongue every 5 (five) minutes as needed for chest pain. 25 tablet 3   No current facility-administered medications on file prior to visit.     Allergies  Allergen Reactions  . Chlorhexidine Gluconate Itching  . Iodinated Diagnostic Agents     Not really sure of the incident// patient denies allergy and has had catherterization without incident.

## 2017-10-03 NOTE — Telephone Encounter (Signed)
He may have benzonatate perles 200 mg, # 30, 1 every 8 hours if needed for cough

## 2017-10-14 ENCOUNTER — Telehealth: Payer: Self-pay | Admitting: Pulmonary Disease

## 2017-10-14 MED ORDER — BENZONATATE 100 MG PO CAPS: 200.0000 mg | ORAL_CAPSULE | Freq: Three times a day (TID) | ORAL | 3 refills | Status: DC | PRN

## 2017-10-14 NOTE — Telephone Encounter (Signed)
Pt is requesting refill on Tessalon 100mg . CY prescribed tessalone 100 on 10/03/17 #30 based off of sick call. Pt states he has been taking this medication every 8 hr scheduled. Pt reports of occ prod cough with white mucus. Denies additional symptoms.  RA please advise on refill. Thanks.

## 2017-10-14 NOTE — Telephone Encounter (Signed)
OK to Rx 200 tid prn # 90 x 3 refills

## 2017-10-14 NOTE — Telephone Encounter (Signed)
Refill of tessalon sent to pt's preferred pharmacy and pt was called to be made aware this was being taken care of.  Pt expressed understanding. Nothing further needed at this time.

## 2017-10-22 ENCOUNTER — Telehealth: Payer: Self-pay | Admitting: Pulmonary Disease

## 2017-10-22 MED ORDER — AZITHROMYCIN 250 MG PO TABS: ORAL_TABLET | ORAL | 0 refills | Status: AC

## 2017-10-22 NOTE — Telephone Encounter (Signed)
Spoke to [place] and relayed below recommendations.  First available slot is tomorrow at 10:45. Patrick QuinLinda stated that she was unable to bring pt tomorrow, due to work. Rx for Zpak has been sent to preferred pharmacy. I have advised Patrick QuinLinda to contact our office or go to UC/ ED if symptoms worsen. Patrick QuinLinda voiced her understanding. Nothing further is needed.

## 2017-10-22 NOTE — Telephone Encounter (Signed)
RA pt: last seen RA 09/24/17 with pending apt for 11/25/17.  Called and spoke to pt's daughter, [place]ett [place] ([place] Harriett Sineancy stated that pt developed a cold two weeks ago.  Since developing cold pt has had prod cough with greenish mucus, increased sob, increased weakness and chest congestion x2wk Pt currently taken tussin DM with mild improvement in cough.  Pt not currently on rescue or maintenance inhalers.   Harriett Sineancy feels that pt has a fever, however pt has not measured temp.  Harriett Sineancy is requesting recommendations.   TP please advise. Thanks.

## 2017-10-22 NOTE — Telephone Encounter (Signed)
Pt's daughter is calling back [phone]

## 2017-10-22 NOTE — Telephone Encounter (Signed)
Attempted to call the pt's daughter. I did not receive an answer. I have left a message for the pt's daughter to return our call.

## 2017-10-22 NOTE — Telephone Encounter (Signed)
Would be better to have her come in for ov to make sure no PNA since she has IPF.   If not able , can try ZPack #1 take as directed.  Mucinex DM Twice daily  As needed  Cough /congestion  Fluids and rest  follow up as planned and As needed   Please contact office for sooner follow up if symptoms do not improve or worsen or seek emergency care

## 2017-10-23 ENCOUNTER — Ambulatory Visit: Payer: Non-veteran care | Admitting: Adult Health

## 2017-10-23 ENCOUNTER — Telehealth: Payer: Self-pay | Admitting: Pulmonary Disease

## 2017-10-23 NOTE — Telephone Encounter (Signed)
Called and spoke with patient he states that it is time for him to get repeat blood work. I advised patient that he does not need an appointment for that. The lab opens at 7:30 and closes at 5:30. Nothing further needed.

## 2017-10-29 ENCOUNTER — Other Ambulatory Visit (INDEPENDENT_AMBULATORY_CARE_PROVIDER_SITE_OTHER): Payer: Non-veteran care

## 2017-10-29 DIAGNOSIS — J84112 Idiopathic pulmonary fibrosis: Secondary | ICD-10-CM | POA: Diagnosis not present

## 2017-10-29 LAB — HEPATIC FUNCTION PANEL
ALK PHOS: 59 U/L (ref 39–117)
ALT: 15 U/L (ref 0–53)
AST: 18 U/L (ref 0–37)
Albumin: 4.1 g/dL (ref 3.5–5.2)
BILIRUBIN DIRECT: 0.1 mg/dL (ref 0.0–0.3)
BILIRUBIN TOTAL: 0.8 mg/dL (ref 0.2–1.2)
Total Protein: 7.5 g/dL (ref 6.0–8.3)

## 2017-11-19 ENCOUNTER — Other Ambulatory Visit: Payer: Self-pay | Admitting: Cardiovascular Disease

## 2017-11-25 ENCOUNTER — Ambulatory Visit (INDEPENDENT_AMBULATORY_CARE_PROVIDER_SITE_OTHER): Payer: Non-veteran care | Admitting: Pulmonary Disease

## 2017-11-25 ENCOUNTER — Telehealth: Payer: Self-pay | Admitting: Pulmonary Disease

## 2017-11-25 ENCOUNTER — Encounter: Payer: Self-pay | Admitting: Pulmonary Disease

## 2017-11-25 ENCOUNTER — Other Ambulatory Visit (INDEPENDENT_AMBULATORY_CARE_PROVIDER_SITE_OTHER): Payer: Non-veteran care

## 2017-11-25 VITALS — BP 120/80 | HR 73 | Ht 68.0 in | Wt 200.6 lb

## 2017-11-25 DIAGNOSIS — J84112 Idiopathic pulmonary fibrosis: Secondary | ICD-10-CM

## 2017-11-25 DIAGNOSIS — E785 Hyperlipidemia, unspecified: Secondary | ICD-10-CM

## 2017-11-25 LAB — HEPATIC FUNCTION PANEL
ALT: 17 U/L (ref 0–53)
AST: 17 U/L (ref 0–37)
Albumin: 3.9 g/dL (ref 3.5–5.2)
Alkaline Phosphatase: 68 U/L (ref 39–117)
BILIRUBIN DIRECT: 0.1 mg/dL (ref 0.0–0.3)
TOTAL PROTEIN: 7.5 g/dL (ref 6.0–8.3)
Total Bilirubin: 0.5 mg/dL (ref 0.2–1.2)

## 2017-11-25 NOTE — Telephone Encounter (Signed)
Called and spoke with patient, advised of RA response. Patient verbalized understanding. Nothing further needed.

## 2017-11-25 NOTE — Telephone Encounter (Signed)
97% No recheck required

## 2017-11-25 NOTE — Telephone Encounter (Signed)
Called and spoke to pt.  Pt stated during his OV today he was advised that his spO2 was 93% by nurse, however his AVS stated 97% for spO2. I explained to pt that sometimes it takes a few moments for the pulse ox to register correctly, and that it could had been that the pulse ox increased to 97% prior to the nurse removing the device from his finger. Pt was concerned that he should come back in for a re check. I explained to pt that both 93 and 97% are both good number, and that I did not feel a re check was needed but a message will be sent to RA to confirm.  RA pease advise. Thanks

## 2017-11-25 NOTE — Assessment & Plan Note (Signed)
Also on statin which increases risk of liver toxicity

## 2017-11-25 NOTE — Progress Notes (Signed)
Was able to talk to patient regarding patient's results.  They verbalized an understanding of what was discussed. No further questions at this time. 

## 2017-11-25 NOTE — Assessment & Plan Note (Signed)
Tolerating Ofev well except for mild diarrhea, which he feels he can live with. LFTs are stable and will be checked again today. Lung function appears stable. We will repeat CT scan on his 74-month follow-up visit

## 2017-11-25 NOTE — Patient Instructions (Addendum)
Check liver tests today. Lung function is stable. Please let me know if diarrhea worsens HRCT next visit

## 2017-11-25 NOTE — Progress Notes (Signed)
   Subjective:    Patient ID: Patrick Knapp, male    DOB: December 16, 1927, 84 y.o.   MRN: [id number]  HPI  84 yo male never smokerFor follow-up of  Idiopathic pulmonary fibrosis.  He was started on OFEV 08/2017 for slight drop in lung function/FVC and has done well overall, reports loose stool x1 daily, liver test so far has been normal, not much nausea He completed pulmonary rehab  Breathing is been stable and he remains active  [place] and both times got sick requiring Z-Pak.  He continues to have a dry cough  Significant tests/ events reviewed  CT chest 2011 showed diffuse right greater than left peripheral and subpleural reticular fibrosis. Ambulatory O2 saturation on room air without desaturation 12/28/2016 ANA , CCP neg  HRCT6/2018 fibrotic interstitial lung disease with mild honeycombing. Slight basilar predominance and slight asymmetrical involvement of the right lung with some progression since 2011.  PFTs 01/2017 no obs, FEV1 96%, FVC 76% DLCO was noted 57% in 04/2017.  Spirometry 06/2017  slight drop in FVC 68%  11/2017 FVC 65%  Review of Systems Patient denies significant dyspnea,cough, hemoptysis,  chest pain, palpitations, pedal edema, orthopnea, paroxysmal nocturnal dyspnea, lightheadedness, nausea, vomiting, abdominal or  leg pains      Objective:   Physical Exam  Gen. Pleasant, well-nourished, in no distress ENT - no thrush, no post nasal drip Neck: No JVD, no thyromegaly, no carotid bruits Lungs: no use of accessory muscles, no dullness to percussion, bibasal rales no rhonchi  Cardiovascular: Rhythm regular, heart sounds  normal, no murmurs or gallops, no peripheral edema Musculoskeletal: No deformities, no cyanosis or clubbing        Assessment & Plan:

## 2018-02-04 DIAGNOSIS — R82998 Other abnormal findings in urine: Secondary | ICD-10-CM | POA: Diagnosis not present

## 2018-02-04 DIAGNOSIS — Z125 Encounter for screening for malignant neoplasm of prostate: Secondary | ICD-10-CM | POA: Diagnosis not present

## 2018-02-04 DIAGNOSIS — E291 Testicular hypofunction: Secondary | ICD-10-CM | POA: Diagnosis not present

## 2018-02-04 DIAGNOSIS — E7849 Other hyperlipidemia: Secondary | ICD-10-CM | POA: Diagnosis not present

## 2018-02-05 ENCOUNTER — Encounter: Payer: Self-pay | Admitting: *Deleted

## 2018-02-05 DIAGNOSIS — Z006 Encounter for examination for normal comparison and control in clinical research program: Secondary | ICD-10-CM

## 2018-02-05 NOTE — Progress Notes (Signed)
[employer] II Research study 24 month telephone follow up completed. Patient denies any chest pain. He was diagnosed with idiopathic pulmonary fibrosis last year, but doing well. Next research required follow up will be due in 1 year.

## 2018-02-06 DIAGNOSIS — M859 Disorder of bone density and structure, unspecified: Secondary | ICD-10-CM | POA: Diagnosis not present

## 2018-02-07 ENCOUNTER — Ambulatory Visit: Payer: Non-veteran care | Admitting: Physician Assistant

## 2018-02-11 DIAGNOSIS — E668 Other obesity: Secondary | ICD-10-CM | POA: Diagnosis not present

## 2018-02-11 DIAGNOSIS — E7849 Other hyperlipidemia: Secondary | ICD-10-CM | POA: Diagnosis not present

## 2018-02-11 DIAGNOSIS — G4709 Other insomnia: Secondary | ICD-10-CM | POA: Diagnosis not present

## 2018-02-11 DIAGNOSIS — K635 Polyp of colon: Secondary | ICD-10-CM | POA: Diagnosis not present

## 2018-02-11 DIAGNOSIS — K219 Gastro-esophageal reflux disease without esophagitis: Secondary | ICD-10-CM | POA: Diagnosis not present

## 2018-02-11 DIAGNOSIS — D692 Other nonthrombocytopenic purpura: Secondary | ICD-10-CM | POA: Diagnosis not present

## 2018-02-11 DIAGNOSIS — Z0000 Encounter for general adult medical examination without abnormal findings: Secondary | ICD-10-CM | POA: Diagnosis not present

## 2018-02-11 DIAGNOSIS — J8410 Pulmonary fibrosis, unspecified: Secondary | ICD-10-CM | POA: Diagnosis not present

## 2018-02-11 DIAGNOSIS — I25119 Atherosclerotic heart disease of native coronary artery with unspecified angina pectoris: Secondary | ICD-10-CM | POA: Diagnosis not present

## 2018-02-25 ENCOUNTER — Inpatient Hospital Stay: Admission: RE | Admit: 2018-02-25 | Payer: Non-veteran care | Source: Ambulatory Visit

## 2018-02-26 ENCOUNTER — Encounter: Payer: Self-pay | Admitting: Pulmonary Disease

## 2018-02-26 ENCOUNTER — Other Ambulatory Visit (INDEPENDENT_AMBULATORY_CARE_PROVIDER_SITE_OTHER): Payer: Medicare HMO

## 2018-02-26 ENCOUNTER — Ambulatory Visit: Payer: Medicare HMO | Admitting: Pulmonary Disease

## 2018-02-26 VITALS — BP 134/72 | HR 64 | Ht 68.0 in | Wt 199.0 lb

## 2018-02-26 DIAGNOSIS — E785 Hyperlipidemia, unspecified: Secondary | ICD-10-CM | POA: Diagnosis not present

## 2018-02-26 DIAGNOSIS — J84112 Idiopathic pulmonary fibrosis: Secondary | ICD-10-CM

## 2018-02-26 LAB — HEPATIC FUNCTION PANEL
ALBUMIN: 4 g/dL (ref 3.5–5.2)
ALK PHOS: 57 U/L (ref 39–117)
ALT: 13 U/L (ref 0–53)
AST: 14 U/L (ref 0–37)
BILIRUBIN DIRECT: 0.1 mg/dL (ref 0.0–0.3)
Total Bilirubin: 0.9 mg/dL (ref 0.2–1.2)
Total Protein: 7.5 g/dL (ref 6.0–8.3)

## 2018-02-26 MED ORDER — NINTEDANIB ESYLATE 100 MG PO CAPS: 100.0000 mg | ORAL_CAPSULE | Freq: Two times a day (BID) | ORAL | 10 refills | Status: DC

## 2018-02-26 NOTE — Assessment & Plan Note (Signed)
Decrease OFEV to 100 twice daily due to diarhhea Check liver tests  Reassess in 3 months to see if he can increase the dose again.  I explained to him that he did not know the effect of lower dose on progression.  Assess spirometry next visit He will find out about cost of CT scan before rescheduling

## 2018-02-26 NOTE — Addendum Note (Signed)
Addended by: Maurene CapesPOTTS, Fynlee Rowlands M on: 02/26/2018 09:46 AM   Modules accepted: Orders

## 2018-02-26 NOTE — Patient Instructions (Addendum)
Decrease OFEV to 100 twice daily due to diarhhea Check liver tests

## 2018-02-26 NOTE — Progress Notes (Signed)
   Subjective:    Patient ID: Patrick Knapp, male    DOB: 30-Jun-1928, 84 y.o.   MRN: [id number]  HPI  84 yo male never smokerFor follow-up ofIdiopathicpulmonary fibrosis.  He was started on OFEV 08/2017 for slight drop in lung function/FVC  He completed pulmonary rehab  He has unfortunately developed diarrhea, but seems to be bad to the point of incontinence and he wonders if dose can be decreased. He has several other questions  -He stopped taking Lipitor? -Asks about study medication that he looked up on the Internet  He remains active, rides a bike but finds that he is more short of breath on walking up an incline.   Significant tests/ events reviewed  CT chest 2011 showed diffuse right greater than left peripheral and subpleural reticular fibrosis. Ambulatory O2 saturation on room air without desaturation 12/28/2016 ANA , CCP neg  HRCT6/2018 fibrotic interstitial lung disease with mild honeycombing. Slight basilar predominance and slight asymmetrical involvement of the right lung with some progression since 2011.  PFTs 01/2017 no obs, FEV1 96%, FVC 76% DLCO was noted 57% in 04/2017.  Spirometry12/2018slight drop in FVC 68%  11/2017 FVC 65%   Review of Systems neg for any significant sore throat, dysphagia, itching, sneezing, nasal congestion or excess/ purulent secretions, fever, chills, sweats, unintended wt loss, pleuritic or exertional cp, hempoptysis, orthopnea pnd or change in chronic leg swelling. Also denies presyncope, palpitations, heartburn, abdominal pain, nausea, vomiting, diarrhea or change in bowel or urinary habits, dysuria,hematuria, rash, arthralgias, visual complaints, headache, numbness weakness or ataxia.     Objective:   Physical Exam   Gen. Pleasant, well-nourished, in no distress ENT - no thrush, no post nasal drip Neck: No JVD, no thyromegaly, no carotid bruits Lungs: no use of accessory muscles, no dullness to percussion,  Bibasal 1/2 rales no rhonchi  Cardiovascular: Rhythm regular, heart sounds  normal, no murmurs or gallops, no peripheral edema Musculoskeletal: No deformities, no cyanosis or clubbing         Assessment & Plan:

## 2018-02-26 NOTE — Assessment & Plan Note (Signed)
Asked him to discuss need for Lipitor with his PCP. We will check LFTs every 3 months

## 2018-03-04 ENCOUNTER — Inpatient Hospital Stay: Admission: RE | Admit: 2018-03-04 | Payer: Non-veteran care | Source: Ambulatory Visit

## 2018-03-07 ENCOUNTER — Telehealth: Payer: Self-pay | Admitting: Pulmonary Disease

## 2018-03-07 DIAGNOSIS — J84112 Idiopathic pulmonary fibrosis: Secondary | ICD-10-CM

## 2018-03-07 MED ORDER — NINTEDANIB ESYLATE 100 MG PO CAPS: 100.0000 mg | ORAL_CAPSULE | Freq: Two times a day (BID) | ORAL | 10 refills | Status: DC

## 2018-03-07 NOTE — Telephone Encounter (Signed)
Spoke with Patrick Knapp at [facility], sandy called patient to alert him they would be shipping out his OFEV and patient let her know that his dose had been changed. Per last AVS MD Alva decreased the dosage to 100mg  BID. Upon further research it was found that the script was sent to the wrong pharmacy. The specialty [place]. Script was sent to correct pharmacy. Patient made aware. Nothing further needed at this time.

## 2018-03-18 ENCOUNTER — Telehealth: Payer: Self-pay | Admitting: Pulmonary Disease

## 2018-03-18 NOTE — Telephone Encounter (Signed)
I called pt but there was no answer. No vm to leave message. Will try again later.

## 2018-03-18 NOTE — Telephone Encounter (Signed)
Tried to contact patient, unable to reach unable to leave voicemail. Will leave open in triage.

## 2018-03-19 NOTE — Telephone Encounter (Signed)
Pt is calling back [phone]

## 2018-03-19 NOTE — Telephone Encounter (Signed)
Attempted to call pt but unable to reach him and unable to leave a message due to VM being full.  Will try to call back later.

## 2018-03-19 NOTE — Telephone Encounter (Signed)
Called patient unable to reach. Unable to leave voicemail. Called emergency contact. Unable to reach left message to give [place] a call back.

## 2018-03-20 NOTE — Telephone Encounter (Signed)
ATC patient - was directed to voice mail but mailbox is full and unable to accept any more messages.  WCB.

## 2018-03-21 NOTE — Telephone Encounter (Signed)
Attempted to call patient to discuss but there was no answer and mailbox was full. This is the third attempt to call and per protocol  We will close encounter.

## 2018-04-06 IMAGING — CT CT CHEST HIGH RESOLUTION W/O CM
2 of 6 series · 15 of 36 positions shown, 18 images · non-contrast
Comparison: 05/24/2014 chest radiograph. 08/09/2009 chest CT
angiogram.

CLINICAL DATA: Worsening dyspnea on exertion. Reported history of
idiopathic pulmonary fibrosis.

EXAM:
CT CHEST WITHOUT CONTRAST
TECHNIQUE: Multidetector CT imaging of the chest was performed following the
standard protocol without intravenous contrast. High resolution
imaging of the lungs, as well as inspiratory and expiratory imaging,
was performed.

[Series 2: high resolution · axial · 0.71mm/px · z∈[-304,-46]mm · 12 of 145 slices shown, 15 images]
[im 8/145  mediastinal]
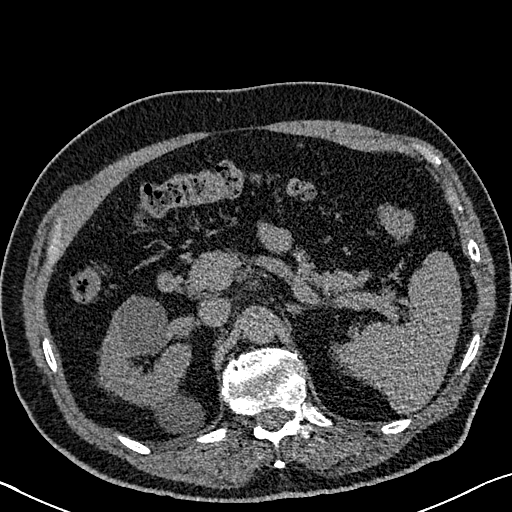
[im 8/145  lung]
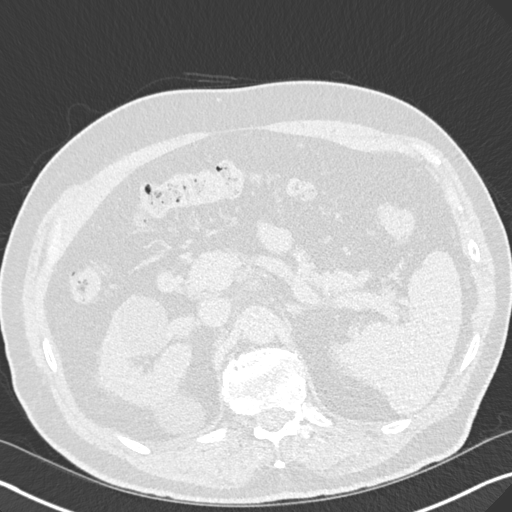
[im 23/145  lung]
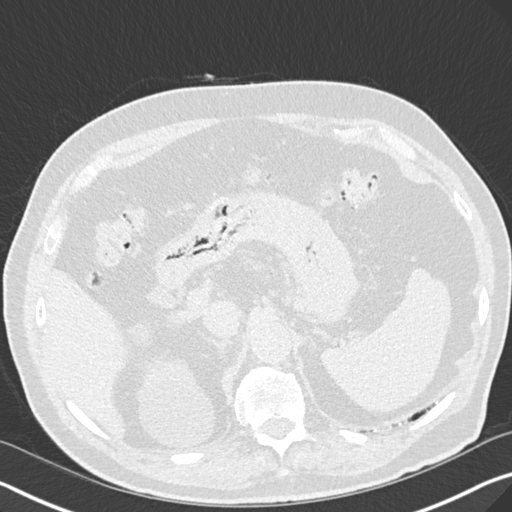
[im 31/145  lung]
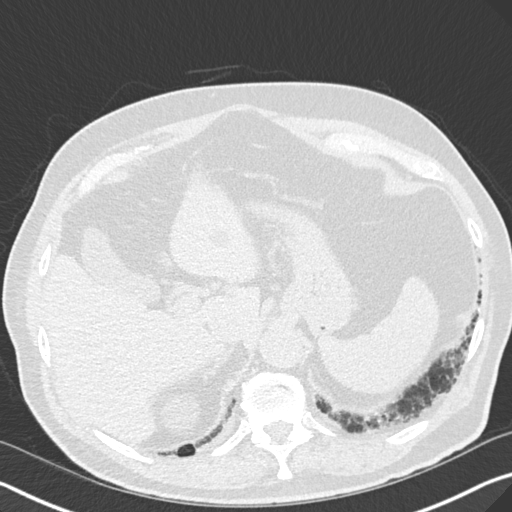
[im 46/145  lung]
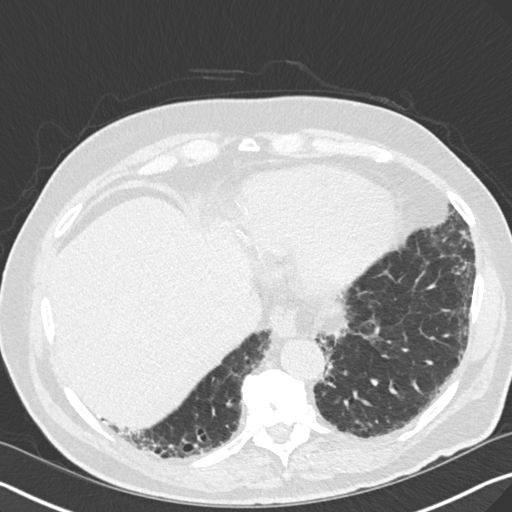
[im 54/145  mediastinal]
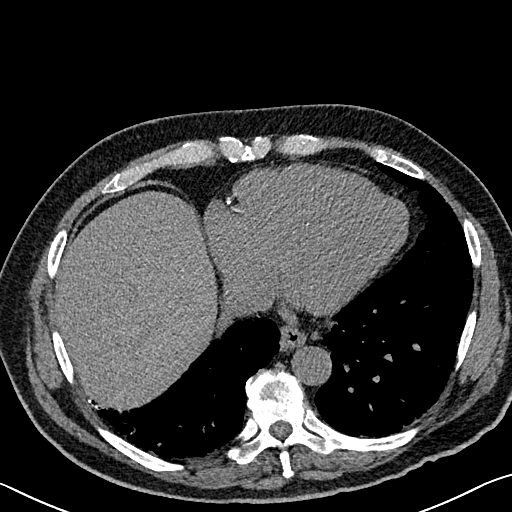
[im 54/145  lung]
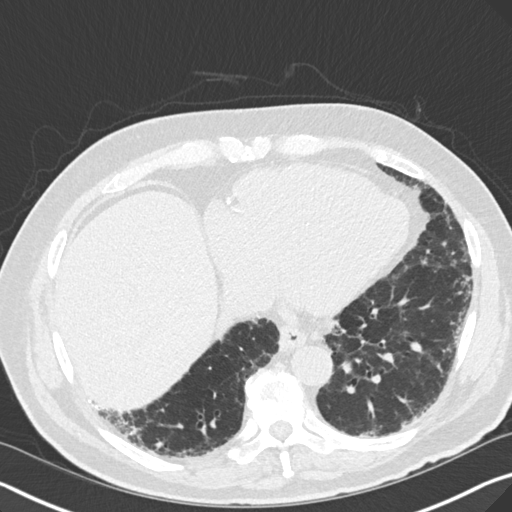
[im 69/145  lung]
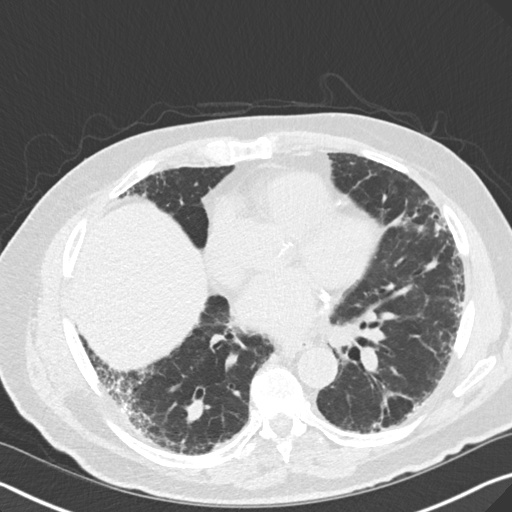
[im 76/145  lung]
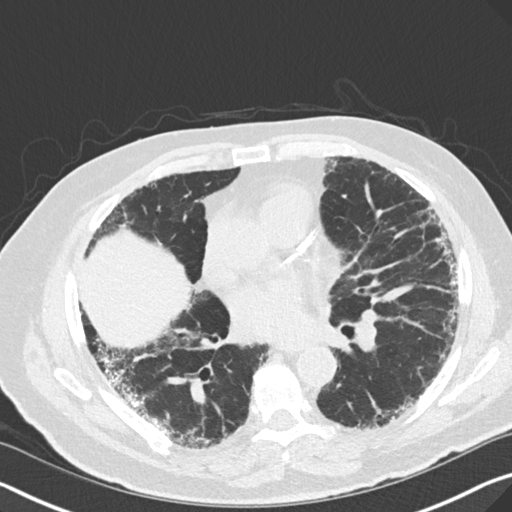
[im 91/145  lung]
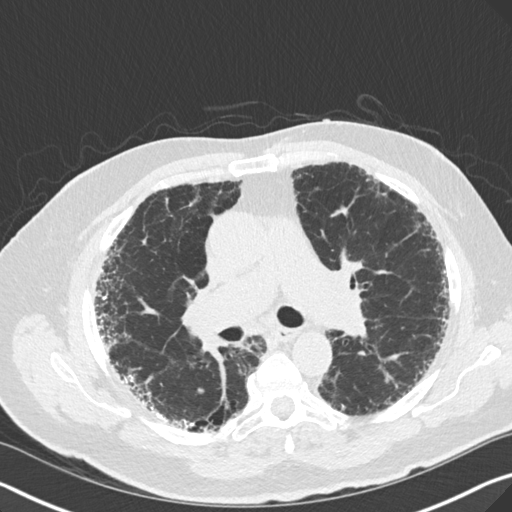
[im 99/145  mediastinal]
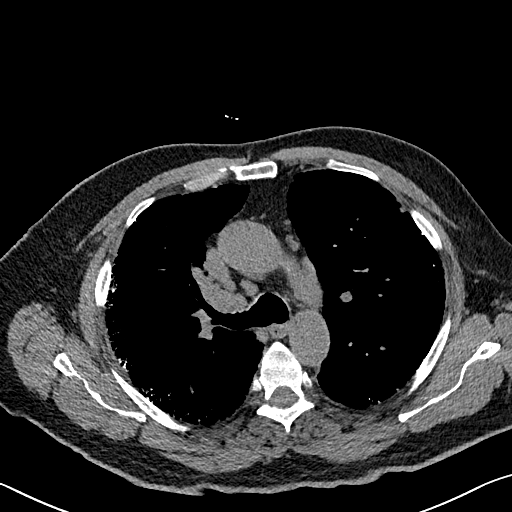
[im 99/145  lung]
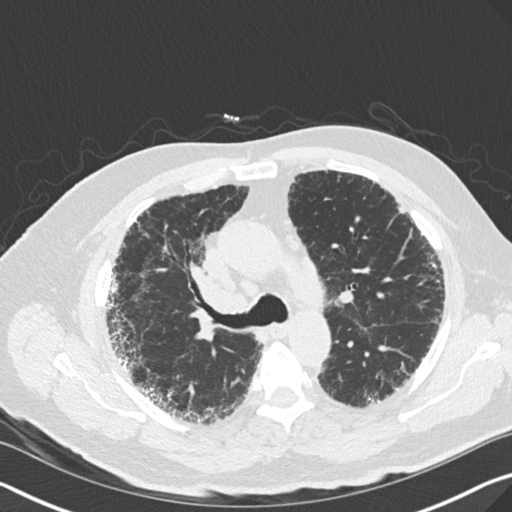
[im 114/145  lung]
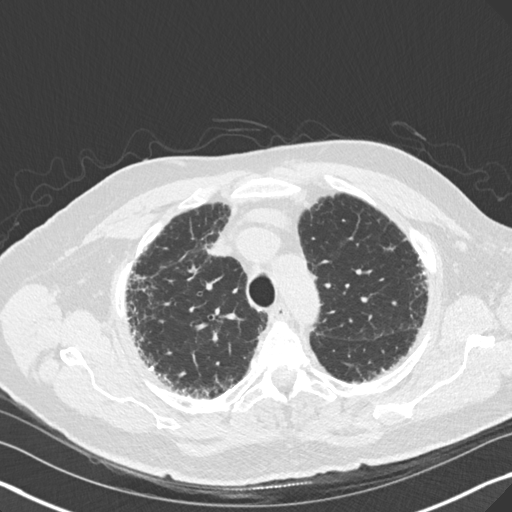
[im 122/145  lung]
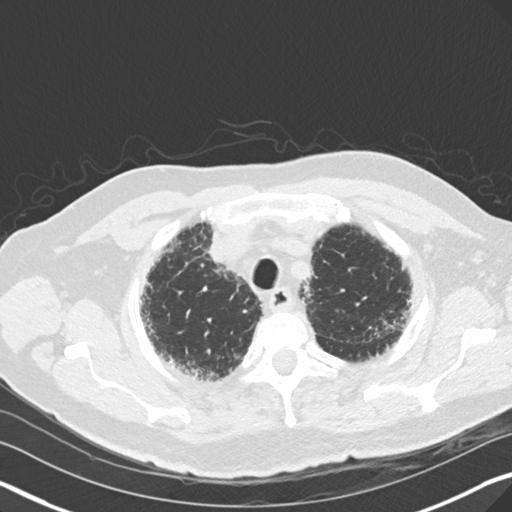
[im 137/145  lung]
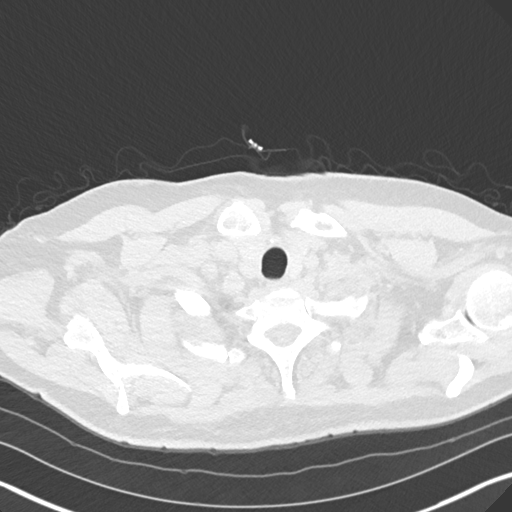

[Series 8: coronal · coronal · 0.59mm/px · 3 of 129 slices shown]
[im 26/129  lung]
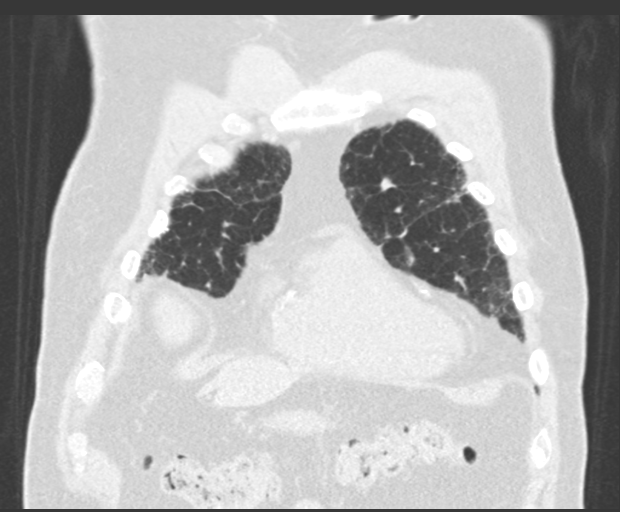
[im 52/129  lung]
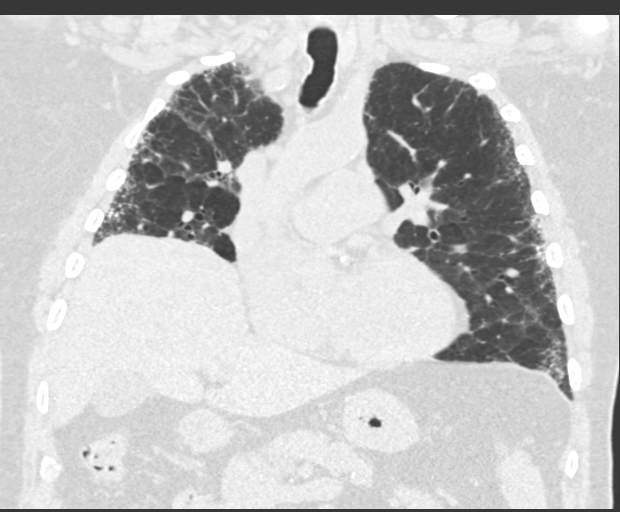
[im 77/129  lung]
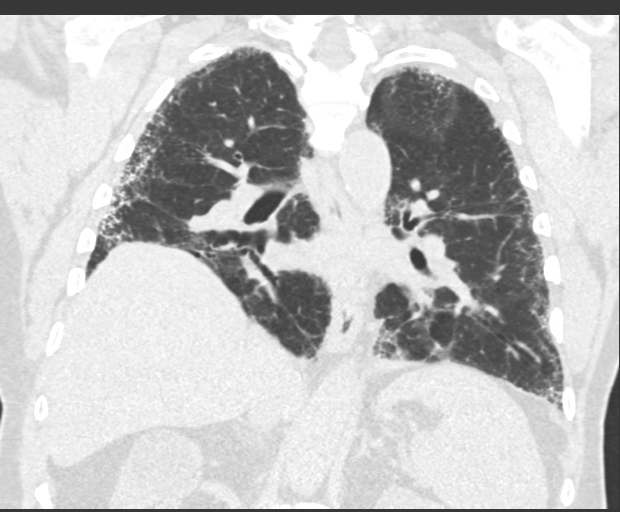

[15 of 36 positions shown; findings below may reference images not displayed]

FINDINGS: Cardiovascular: Top-normal heart size. No significant pericardial
fluid/thickening. Left anterior descending, left circumflex and
right coronary atherosclerosis. Thoracic aortic atherosclerosis.
Ectatic 4.0 cm ascending thoracic aorta, mildly increased,
previously 3.8 cm on 08/09/2009. Top-normal size main pulmonary
artery (3.1 cm diameter).

Mediastinum/Nodes: No discrete thyroid nodules. Unremarkable
esophagus. No pathologically enlarged axillary, mediastinal or gross
hilar lymph nodes, noting limited sensitivity for the detection of
hilar adenopathy on this noncontrast study.

Lungs/Pleura: No pneumothorax. No pleural effusion. No acute
consolidative airspace disease, lung masses or significant pulmonary
nodules. There is patchy confluent subpleural reticulation and mild
ground-glass attenuation throughout both lungs with associated mild
traction bronchiectasis and bronchiolectasis. There is a slight
basilar gradient to the findings, which slightly asymmetrically
involve the right lung. There is mild honeycombing in the dependent
basilar right lower lobe. These findings have progressed since
08/09/2009 chest CT angiogram. No significant air trapping on the
expiration sequence.

Upper abdomen: Simple 1.6 cm liver cyst in the inferior lateral
segment left liver lobe. Simple appearing renal cysts in the upper
right kidney, largest 5.4 cm in the posterior upper right kidney.

Musculoskeletal: No aggressive appearing focal osseous lesions.
Moderate thoracic spondylosis.
IMPRESSION: 1. Fibrotic interstitial lung disease with mild honeycombing, slight
basilar predominance, slight asymmetric involvement of the right
lung and progression since 5677 chest CT, considered diagnostic of
usual interstitial pneumonia (UIP).
2. Ectatic 4.0 cm ascending thoracic aorta, mildly increased since
[DATE]. Three-vessel coronary atherosclerosis.

Aortic Atherosclerosis ([id number].G).

## 2018-04-07 ENCOUNTER — Other Ambulatory Visit: Payer: Self-pay | Admitting: Cardiovascular Disease

## 2018-04-07 MED ORDER — METOPROLOL TARTRATE 50 MG PO TABS: 25.0000 mg | ORAL_TABLET | Freq: Two times a day (BID) | ORAL | 0 refills | Status: DC

## 2018-05-28 ENCOUNTER — Ambulatory Visit: Payer: 59 | Admitting: Cardiovascular Disease

## 2018-05-28 ENCOUNTER — Encounter: Payer: Self-pay | Admitting: Cardiovascular Disease

## 2018-05-28 VITALS — BP 120/64 | HR 61 | Ht 68.0 in | Wt 191.0 lb

## 2018-05-28 DIAGNOSIS — E782 Mixed hyperlipidemia: Secondary | ICD-10-CM | POA: Diagnosis not present

## 2018-05-28 DIAGNOSIS — I2510 Atherosclerotic heart disease of native coronary artery without angina pectoris: Secondary | ICD-10-CM

## 2018-05-28 NOTE — Patient Instructions (Signed)

## 2018-05-28 NOTE — Progress Notes (Signed)
Patrick Knapp Date of Birth  03-May-1928 [facility] [address]    [address] [place], [place]  [address] [phone]  Fax  [phone]   Problem list: 1. Coronary artery disease 2. Hypertension 3. Hyperlipidemia 4. BPH   Patrick Knapp is an 84 y.o.  with a hx of CAD, HTN, hyperlipidemia, and BPH.  He is not had any episodes of chest pain or shortness breath. He's exploring some alternate therapies for his BPH including cryotherapy.  He works at [employer] for 4 hours every day. He does not get any other regular exercise.  Sept. 9, 2015:  Patrick Knapp has had some DOE walking through the airport terminal .   No CP -   Similar to how he felt prior to stenting  He has a right knee injury - needs knee replacement on Nov. 23. ( Al[employer])  Has not been able to exercise.    January 23, 2016:  Patrick Knapp is seen today after a 2 year absence.   Seen with his daughter, Patrick Knapp  He complains of lots of fatigue. His last Myoview study was 03/25/2016. He had no evidence of ischemia at that time and had normal left ventricle systolic function.  Has severe exhaustion with any exertion .   Has lots of DOE   thinks that he feels similar to when he had his stent placed . He has never had CP - even prior to stenting  His DOE seems to be worsening   DOE even walking to his mail box   Jun 04, 2016:  Patrick Knapp had PCI ( [employer])   of his LAD in July . Doing much better  Still having DOE and fatigue walking up hills  Had talked to [facility] about starting Imdur if needed.   Nov 27, 2016:  Doing well.  Has some exertional leg fatigue.  No CP or dyspnea.   Nov. 20, 2019  No CP  Has some DOE with walking up a hill.  Has IPF    Current Outpatient Medications on File Prior to Visit  Medication Sig Dispense Refill  . ALPRAZolam (XANAX) 0.5 MG tablet Take 0.5 mg by mouth 3 (three) times daily as needed. AS NEEDED FOR ANXIETY    . amLODipine (NORVASC) 2.5 MG tablet Take 1 tablet (2.5 mg  total) by mouth daily. 90 tablet 3  . atorvastatin (LIPITOR) 20 MG tablet Take 20 mg by mouth daily.     . benzonatate (TESSALON PERLES) 100 MG capsule Take 2 capsules (200 mg total) by mouth every 8 (eight) hours as needed for cough. 90 capsule 3  . cholecalciferol (VITAMIN D) 1000 units tablet Take 1,000 Units by mouth daily.    . clopidogrel (PLAVIX) 75 MG tablet Take 1 tablet (75 mg total) by mouth daily. 30 tablet 11  . doxazosin (CARDURA) 4 MG tablet Take 2 mg by mouth every morning.     . isosorbide mononitrate (IMDUR) 30 MG 24 hr tablet TAKE 1 TABLET (30 MG TOTAL) BY MOUTH DAILY. 90 tablet 2  . magnesium gluconate (MAGONATE) 500 MG tablet Take 500 mg by mouth daily.    . metoprolol tartrate (LOPRESSOR) 50 MG tablet Take 0.5 tablets (25 mg total) by mouth 2 (two) times daily. Please keep upcoming appt in November for future refills. Thank you 10 tablet 0  . Nintedanib (OFEV) 100 MG CAPS Take 1 capsule (100 mg total) by mouth 2 (two) times daily. 60 capsule 10  . nitroGLYCERIN (NITROSTAT)  0.4 MG SL tablet Place 1 tablet (0.4 mg total) under the tongue every 5 (five) minutes as needed for chest pain. 25 tablet 3   No current facility-administered medications on file prior to visit.     Allergies  Allergen Reactions  . Chlorhexidine Gluconate Itching  . Iodinated Diagnostic Agents     Not really sure of the incident// patient denies allergy and has had catherterization without incident.    Past Medical History:  Diagnosis Date  . Arthritis    "all the joints" (02/06/2016)  . Coronary artery disease    a. PCI with DES to mid LAD 7/17 b.post PTCA and stenting of his left circumflex artery in 12/08  . Enlarged prostate   . GERD (gastroesophageal reflux disease)   . Hyperlipidemia   . Hypertension   . Ischemia   . Pulmonary fibrosis, unspecified (HCC)   . Shortness of breath   . Skin disorder    Occurs at times as rash, whelps, blister-like areas  with itching sometimes-has  occurred for years , but can not find an exact dx or cause    Past Surgical History:  Procedure Laterality Date  . CARDIAC CATHETERIZATION N/A 02/06/2016   Procedure: Right/Left Heart Cath and Coronary Angiography;  Surgeon: Tonny BollmanMichael Cooper, MD;  [facility];  Service: Cardiovascular;  Laterality: N/A;  . CATARACT EXTRACTION, BILATERAL Bilateral 2016  . CORONARY ANGIOPLASTY WITH STENT PLACEMENT  06/2007   Est. EF of 55-60%PTCA and stenting of his left circumflex artery in 12/08  . EYE SURGERY    . INGUINAL HERNIA REPAIR Left   . JOINT REPLACEMENT    . KNEE ARTHROSCOPY  1990s  . ORBITAL FRACTURE SURGERY Left 1957  . TONSILLECTOMY    . TOTAL KNEE ARTHROPLASTY Right 05/31/2014   Procedure: RIGHT TOTAL KNEE ARTHROPLASTY;  Surgeon: Loanne DrillingFrank Aluisio V, MD;  Location: [facility];  Service: Orthopedics;  Laterality: Right;    Social History   Tobacco Use  Smoking Status Never Smoker  Smokeless Tobacco Never Used    Social History   Substance and Sexual Activity  Alcohol Use Yes   Comment: 02/06/2016 "might have a glass of beer or wine once/month"    Family History  Problem Relation Age of Onset  . Heart failure Mother        possible  . Heart disease Mother   . Heart disease Father   . Coronary artery disease Brother   . Heart disease Brother     Reviw of Systems:  Reviewed in the HPI.  All other systems are negative.  Physical Exam: Blood pressure 120/64, pulse 61, height 5\' 8"  (1.727 m), weight 191 lb (86.6 kg), SpO2 98 %.  GEN:   Elderly male.  HEENT: Normal NECK: No JVD; No carotid bruits LYMPHATICS: No lymphadenopathy CARDIAC: RRR , no murmurs, rubs, gallops RESPIRATORY:  Fine rales in bases.  ABDOMEN: Soft, non-tender, non-distended MUSCULOSKELETAL:  No edema; No deformity  SKIN: Warm and dry NEUROLOGIC:  Alert and oriented x 3      ECG: May 28, 2018: Normal sinus rhythm at 61.  Normal EKG.  Assessment / Plan:    1. Coronary artery  disease: Lipid lipid levels from his primary medical doctor's office from July, 2019 reveal a total cholesterol of 133.  The HDL is 28.  The LDL is 76.  The triglyceride level is 146. Denies having any chest pain.  Continue current medications. .   2. Hypertension -   BP has been  stable   3. Hyperlipidemia -  Lipids managed by Dr. Felipa Eth  4. BPH    Kristeen Miss, MD   05/28/2018 10:51 AM    [facility] [address] [place],  [address] [place], [place]  [address] Pager [phone] Phone: [phone]; Fax: [phone]

## 2018-05-29 ENCOUNTER — Encounter: Payer: Self-pay | Admitting: Pulmonary Disease

## 2018-05-29 ENCOUNTER — Ambulatory Visit: Payer: 59 | Admitting: Pulmonary Disease

## 2018-05-29 VITALS — BP 118/62 | HR 67 | Ht 68.0 in | Wt 191.0 lb

## 2018-05-29 DIAGNOSIS — E7849 Other hyperlipidemia: Secondary | ICD-10-CM

## 2018-05-29 DIAGNOSIS — J84112 Idiopathic pulmonary fibrosis: Secondary | ICD-10-CM | POA: Diagnosis not present

## 2018-05-29 LAB — HEPATIC FUNCTION PANEL
ALT: 12 U/L (ref 0–53)
AST: 15 U/L (ref 0–37)
Albumin: 3.7 g/dL (ref 3.5–5.2)
Alkaline Phosphatase: 62 U/L (ref 39–117)
Bilirubin, Direct: 0.2 mg/dL (ref 0.0–0.3)
Total Bilirubin: 1 mg/dL (ref 0.2–1.2)
Total Protein: 7 g/dL (ref 6.0–8.3)

## 2018-05-29 NOTE — Patient Instructions (Signed)
Lung function is still dropping. Continue on oFEV 100 mg twice daily. Blood work today.  Try to take Imodium every day

## 2018-05-29 NOTE — Assessment & Plan Note (Signed)
Lung function is still dropping. Continue on oFEV 100 mg twice daily. Blood work today.  Try to take Imodium every day  We discussed alternative medication Esbriet he would like to wait until meeting with the support group

## 2018-05-29 NOTE — Addendum Note (Signed)
Addended by: Ebony CargoKING, Knight Oelkers M on: 05/29/2018 10:26 AM   Modules accepted: Orders

## 2018-05-29 NOTE — Assessment & Plan Note (Signed)
Check LFTs since he is also on Lipitor

## 2018-05-29 NOTE — Progress Notes (Signed)
   Subjective:    Patient ID: Patrick Knapp, male    DOB: July 29, 1927, 84 y.o.   MRN: [id number]  HPI  84yo male never smokerFor follow-up ofIdiopathicpulmonary fibrosis.  He was started on OFEV2/2019 forslight drop in lung function/FVC He completed pulmonary rehab  Chief Complaint  Patient presents with  . Follow-up    3 month follow up for IPF. States his breathing has ok since his last visit. Wants to discuss possibly switching from Ofev to Esbriet.    Due to diarrhea, dose of Ofev  was dropped to 100 twice daily.  He continues to have some diarrhea but admits that this may be because he does not take the Imodium daily. Breathing is otherwise stable, no cough or wheezing.  He admits to being less active than he was before.  He is getting involved with the support group and hopes to find out more on an upcoming meeting. We discussed side effects of alternative medication Esbriet  LFTs from 02/2018 were normal, of note he is also on Lipitor  FVC 49% today   Significant tests/ events reviewed  CT chest 2011 showed diffuse right greater than left peripheral and subpleural reticular fibrosis. Ambulatory O2 saturation on room air without desaturation 12/28/2016 ANA , CCP neg  [id number]/[id number] interstitial lung disease with mild honeycombing. Slight basilar predominance and slight asymmetrical involvement of the right lung with some progression since 2011.  PFTs 01/2017 no obs, FEV1 96%, FVC 76% DLCO was noted 57% in 04/2017.  Spirometry12/2018slight drop in FVC 68% 11/2017 FVC 65%  Review of Systems Patient denies significant dyspnea,cough, hemoptysis,  chest pain, palpitations, pedal edema, orthopnea, paroxysmal nocturnal dyspnea, lightheadedness, nausea, vomiting, abdominal or  leg pains      Objective:   Physical Exam   Gen. Pleasant, elderly,well-nourished, in no distress ENT - no thrush, no post nasal drip Neck: No JVD, no thyromegaly, no  carotid bruits Lungs: no use of accessory muscles, no dullness to percussion, fine bibasal rales no rhonchi  Cardiovascular: Rhythm regular, heart sounds  normal, no murmurs or gallops, no peripheral edema Musculoskeletal: No deformities, no cyanosis or clubbing         Assessment & Plan:

## 2018-06-12 ENCOUNTER — Telehealth: Payer: Self-pay | Admitting: Pulmonary Disease

## 2018-06-12 DIAGNOSIS — J84112 Idiopathic pulmonary fibrosis: Secondary | ICD-10-CM

## 2018-06-12 NOTE — Telephone Encounter (Signed)
Called and spoke to patient, patient states he was told he was getting a order for a spirometer. He is upset because no one has contacted him.   RA please advise if okay to place order to DME for spirometer?

## 2018-06-12 NOTE — Telephone Encounter (Signed)
OK to place order.

## 2018-06-13 NOTE — Telephone Encounter (Signed)
Call made to patient, made aware we were placing order for spirometer and it would be coming from a DME company and it could take up to 2 weeks to get to him. Voiced understanding. Nothing further is needed at this time.

## 2018-07-07 ENCOUNTER — Other Ambulatory Visit: Payer: Self-pay | Admitting: Cardiovascular Disease

## 2018-07-10 ENCOUNTER — Inpatient Hospital Stay: Admission: RE | Admit: 2018-07-10 | Payer: Non-veteran care | Source: Ambulatory Visit

## 2018-08-13 ENCOUNTER — Ambulatory Visit (INDEPENDENT_AMBULATORY_CARE_PROVIDER_SITE_OTHER)
Admission: RE | Admit: 2018-08-13 | Discharge: 2018-08-13 | Disposition: A | Discharge status: Home / Self Care | Payer: Medicare HMO | Source: Ambulatory Visit | Attending: Pulmonary Disease | Admitting: Pulmonary Disease

## 2018-08-13 DIAGNOSIS — J84112 Idiopathic pulmonary fibrosis: Secondary | ICD-10-CM | POA: Diagnosis not present

## 2018-08-13 DIAGNOSIS — J8410 Pulmonary fibrosis, unspecified: Secondary | ICD-10-CM | POA: Diagnosis not present

## 2018-08-19 ENCOUNTER — Other Ambulatory Visit: Payer: Self-pay | Admitting: Cardiovascular Disease

## 2018-09-29 ENCOUNTER — Other Ambulatory Visit: Payer: Self-pay | Admitting: Cardiovascular Disease

## 2018-12-19 ENCOUNTER — Telehealth (INDEPENDENT_AMBULATORY_CARE_PROVIDER_SITE_OTHER): Payer: Medicare HMO | Admitting: Cardiovascular Disease

## 2018-12-19 ENCOUNTER — Other Ambulatory Visit: Payer: Self-pay

## 2018-12-19 ENCOUNTER — Encounter: Payer: Self-pay | Admitting: Cardiovascular Disease

## 2018-12-19 VITALS — BP 149/71 | HR 60 | Ht 68.0 in | Wt 181.0 lb

## 2018-12-19 DIAGNOSIS — Z7189 Other specified counseling: Secondary | ICD-10-CM

## 2018-12-19 DIAGNOSIS — I25119 Atherosclerotic heart disease of native coronary artery with unspecified angina pectoris: Secondary | ICD-10-CM

## 2018-12-19 DIAGNOSIS — J84112 Idiopathic pulmonary fibrosis: Secondary | ICD-10-CM

## 2018-12-19 DIAGNOSIS — R5383 Other fatigue: Secondary | ICD-10-CM

## 2018-12-19 NOTE — Patient Instructions (Addendum)
Medication Instructions:  Your physician recommends that you continue on your current medications as directed. Please refer to the Current Medication list given to you today.  If you need a refill on your cardiac medications before your next appointment, please call your pharmacy.     Lab work: None Ordered    Testing/Procedures: Your physician has requested that you have an echocardiogram. Echocardiography is a painless test that uses sound waves to create images of your heart. It provides your doctor with information about the size and shape of your heart and how well your heart's chambers and valves are working. This procedure takes approximately one hour. There are no restrictions for this procedure.  Your physician has requested that you have a lexiscan myoview. For further information please visit [email or web address]. Please follow instruction sheet, as given.    Follow-Up: Your physician recommends that you come in for a follow-up appointment on Monday September 14 at 9:20 am

## 2018-12-19 NOTE — Progress Notes (Signed)
Virtual Visit via Telephone Note   This visit type was conducted due to national recommendations for restrictions regarding the COVID-19 Pandemic (e.g. social distancing) in an effort to limit this patient's exposure and mitigate transmission in our community.  Due to his co-morbid illnesses, this patient is at least at moderate risk for complications without adequate follow up.  This format is felt to be most appropriate for this patient at this time.  The patient did not have access to video technology/had technical difficulties with video requiring transitioning to audio format only (telephone).  All issues noted in this document were discussed and addressed.  No physical exam could be performed with this format.  Please refer to the patient's chart for his  consent to telehealth for [facility].   Date:  12/19/2018   ID:  Patrick Knapp, DOB 23-Aug-1927, MRN [id number]  Patient Location: Home Provider Location: Home  PCP:  Prince Solian, MD  Cardiologist:  Mertie Moores, MD  Electrophysiologist:  None   Problem list: 1. Coronary artery disease 2. Hypertension 3. Hyperlipidemia 4. BPH   Patrick Knapp is an 85 y.o.  with a hx of CAD, HTN, hyperlipidemia, and BPH.  He is not had any episodes of chest pain or shortness breath. He's exploring some alternate therapies for his BPH including cryotherapy.  He works at [employer] for 4 hours every day. He does not get any other regular exercise.  Sept. 9, 2015:  Patrick Knapp has had some DOE walking through the airport terminal .   No CP -   Similar to how he felt prior to stenting  He has a right knee injury - needs knee replacement on Nov. 23. ( Al[employer]io)  Has not been able to exercise.    January 23, 2016:  Patrick Knapp is seen today after a 2 year absence.   Seen with his daughter, Vaughan Basta  He complains of lots of fatigue. His last Myoview study was 03/25/2016. He had no evidence of ischemia at that time and had normal left ventricle systolic  function.  Has severe exhaustion with any exertion .   Has lots of DOE   thinks that he feels similar to when he had his stent placed . He has never had CP - even prior to stenting  His DOE seems to be worsening   DOE even walking to his mail box   Jun 04, 2016:  Patrick Knapp had PCI ( [employer])   of his LAD in July . Doing much better  Still having DOE and fatigue walking up hills  Had talked to [employer] about starting Imdur if needed.   Nov 27, 2016:  Doing well.  Has some exertional leg fatigue.  No CP or dyspnea.   Nov. 20, 2019  No CP  Has some DOE with walking up a hill.  Has IPF    Evaluation Performed:  Follow-Up Visit  Chief Complaint:  CAD   History of Present Illness:    Patrick Knapp is a 85 y.o. male with hx of CAD . Seems to be doing well  Has had some fatigue .  Still walking but is very fatigued waling 50 yars.  Has pulmonary fibrosis .  O2 sats have been ok Had PCI in 2017   Has noticed that his HR is irreg.   HR is normal - 65 .  Does not avoid salt .  The patient does not have symptoms concerning for COVID-19 infection (fever, chills, cough, or new shortness  of breath).    Past Medical History:  Diagnosis Date  . Arthritis    "all the joints" (02/06/2016)  . Coronary artery disease    a. PCI with DES to mid LAD 7/17 b.post PTCA and stenting of his left circumflex artery in 12/08  . Enlarged prostate   . GERD (gastroesophageal reflux disease)   . Hyperlipidemia   . Hypertension   . Ischemia   . Pulmonary fibrosis, unspecified (HCC)   . Shortness of breath   . Skin disorder    Occurs at times as rash, whelps, blister-like areas  with itching sometimes-has occurred for years , but can not find an exact dx or cause   Past Surgical History:  Procedure Laterality Date  . CARDIAC CATHETERIZATION N/A 02/06/2016   Procedure: Right/Left Heart Cath and Coronary Angiography;  Surgeon: Tonny BollmanMichael Cooper, MD;  [facility];   Service: Cardiovascular;  Laterality: N/A;  . CATARACT EXTRACTION, BILATERAL Bilateral 2016  . CORONARY ANGIOPLASTY WITH STENT PLACEMENT  06/2007   Est. EF of 55-60%PTCA and stenting of his left circumflex artery in 12/08  . EYE SURGERY    . INGUINAL HERNIA REPAIR Left   . JOINT REPLACEMENT    . KNEE ARTHROSCOPY  1990s  . ORBITAL FRACTURE SURGERY Left 1957  . TONSILLECTOMY    . TOTAL KNEE ARTHROPLASTY Right 05/31/2014   Procedure: RIGHT TOTAL KNEE ARTHROPLASTY;  Surgeon: Loanne DrillingFrank Aluisio V, MD;  Location: [facility];  Service: Orthopedics;  Laterality: Right;     Current Meds  Medication Sig  . ALPRAZolam (XANAX) 0.5 MG tablet Take 0.5 mg by mouth 3 (three) times daily as needed. AS NEEDED FOR ANXIETY  . amLODipine (NORVASC) 2.5 MG tablet TAKE 1 TABLET EVERY DAY  . atorvastatin (LIPITOR) 20 MG tablet Take 20 mg by mouth daily.   . benzonatate (TESSALON PERLES) 100 MG capsule Take 2 capsules (200 mg total) by mouth every 8 (eight) hours as needed for cough.  . cholecalciferol (VITAMIN D) 1000 units tablet Take 1,000 Units by mouth daily.  . clopidogrel (PLAVIX) 75 MG tablet Take 1 tablet (75 mg total) by mouth daily.  Marland Kitchen. doxazosin (CARDURA) 4 MG tablet Take 2 mg by mouth every morning.   . isosorbide mononitrate (IMDUR) 30 MG 24 hr tablet TAKE 1 TABLET EVERY DAY  . magnesium gluconate (MAGONATE) 500 MG tablet Take 500 mg by mouth daily.  . metoprolol tartrate (LOPRESSOR) 50 MG tablet TAKE 1/2  TABLET 2 (TWO) TIMES DAILY. PLEASE KEEP UPCOMING APPT IN NOVEMBER FOR FUTURE REFILLS. THANK YOU.  . Nintedanib (OFEV) 100 MG CAPS Take 1 capsule (100 mg total) by mouth 2 (two) times daily.  . nitroGLYCERIN (NITROSTAT) 0.4 MG SL tablet Place 1 tablet (0.4 mg total) under the tongue every 5 (five) minutes as needed for chest pain.     Allergies:   Chlorhexidine gluconate and Iodinated diagnostic agents   Social History   Tobacco Use  . Smoking status: Never Smoker  . Smokeless tobacco: Never Used   Substance Use Topics  . Alcohol use: Yes    Comment: 02/06/2016 "might have a glass of beer or wine once/month"  . Drug use: No     Family Hx: The patient's family history includes Coronary artery disease in his brother; Heart disease in his brother, father, and mother; Heart failure in his mother.  ROS:   Please see the history of present illness.     All other systems reviewed and are negative.  Prior CV studies:   The following studies were reviewed today:    Labs/Other Tests and Data Reviewed:    EKG:  No ECG reviewed.  Recent Labs: 05/29/2018: ALT 12   Recent Lipid Panel Lab Results  Component Value Date/Time   CHOL 115 03/17/2014 09:23 AM   TRIG 142.0 03/17/2014 09:23 AM   HDL 37.20 (L) 03/17/2014 09:23 AM   CHOLHDL 3 03/17/2014 09:23 AM   LDLCALC 49 03/17/2014 09:23 AM    Wt Readings from Last 3 Encounters:  12/19/18 181 lb (82.1 kg)  05/29/18 191 lb (86.6 kg)  05/28/18 191 lb (86.6 kg)     Objective:    Vital Signs:  BP (!) 149/71   Pulse 60   Ht 5\' 8"  (1.727 m)   Wt 181 lb (82.1 kg)   SpO2 95%   BMI 27.52 kg/m     ASSESSMENT & PLAN:    1. CAD :  No angina.  He is fatigued which has been an angina equivalent.    Will get a lexiscan myoview and an echo   2.  HTN:  Still eating lots of salt .   Will cut back   3.  Pulmonary HTN:  Followed by pulmonary   COVID-19 Education: The signs and symptoms of COVID-19 were discussed with the patient and how to seek care for testing (follow up with PCP or arrange E-visit).  The importance of social distancing was discussed today.  Time:   Today, I have spent  17 minutes with the patient with telehealth technology discussing the above problems.     Medication Adjustments/Labs and Tests Ordered: Current medicines are reviewed at length with the patient today.  Concerns regarding medicines are outlined above.   Tests Ordered: No orders of the defined types were placed in this encounter.   Medication  Changes: No orders of the defined types were placed in this encounter.   Follow Up:  In Person in 3 month(s)  Signed, Kristeen MissPhilip Rossi Silvestro, MD  12/19/2018 8:43 AM    [facility]

## 2018-12-23 ENCOUNTER — Telehealth (HOSPITAL_COMMUNITY): Payer: Self-pay | Admitting: *Deleted

## 2018-12-23 ENCOUNTER — Telehealth (HOSPITAL_COMMUNITY): Payer: Self-pay | Admitting: Cardiology

## 2018-12-23 NOTE — Telephone Encounter (Signed)

## 2018-12-23 NOTE — Telephone Encounter (Signed)
Patient given detailed instructions per Myocardial Perfusion Study Information Sheet for the test on 12/24/18 at 10:;45. Patient notified to arrive 15 minutes early and that it is imperative to arrive on time for appointment to keep from having the test rescheduled.  If you need to cancel or reschedule your appointment, please call the office within 24 hours of your appointment. . Patient verbalized understanding.Patrick Knapp

## 2018-12-24 ENCOUNTER — Ambulatory Visit (HOSPITAL_COMMUNITY): Payer: Medicare HMO

## 2018-12-24 ENCOUNTER — Other Ambulatory Visit: Payer: Self-pay

## 2018-12-24 ENCOUNTER — Ambulatory Visit (HOSPITAL_COMMUNITY): Payer: Medicare HMO | Attending: Cardiology

## 2018-12-24 DIAGNOSIS — I25119 Atherosclerotic heart disease of native coronary artery with unspecified angina pectoris: Secondary | ICD-10-CM | POA: Diagnosis not present

## 2018-12-24 DIAGNOSIS — J84112 Idiopathic pulmonary fibrosis: Secondary | ICD-10-CM | POA: Diagnosis not present

## 2018-12-24 DIAGNOSIS — R5383 Other fatigue: Secondary | ICD-10-CM | POA: Diagnosis not present

## 2018-12-24 LAB — ECHOCARDIOGRAM COMPLETE
Height: 68 in
Weight: 2896 oz

## 2018-12-26 ENCOUNTER — Telehealth: Payer: Self-pay | Admitting: Cardiovascular Disease

## 2018-12-26 NOTE — Telephone Encounter (Signed)
Patient is calling for echo results 

## 2018-12-26 NOTE — Telephone Encounter (Signed)
Echo results reviewed w/ pt. Pt appreciates the call.

## 2018-12-29 ENCOUNTER — Other Ambulatory Visit: Payer: Self-pay | Admitting: Cardiovascular Disease

## 2018-12-31 ENCOUNTER — Other Ambulatory Visit: Payer: Self-pay

## 2018-12-31 ENCOUNTER — Ambulatory Visit (HOSPITAL_COMMUNITY): Payer: Medicare HMO | Attending: Cardiology

## 2018-12-31 VITALS — Ht 68.0 in | Wt 181.0 lb

## 2018-12-31 DIAGNOSIS — I25119 Atherosclerotic heart disease of native coronary artery with unspecified angina pectoris: Secondary | ICD-10-CM | POA: Diagnosis not present

## 2018-12-31 DIAGNOSIS — J84112 Idiopathic pulmonary fibrosis: Secondary | ICD-10-CM | POA: Insufficient documentation

## 2018-12-31 DIAGNOSIS — R5383 Other fatigue: Secondary | ICD-10-CM | POA: Insufficient documentation

## 2018-12-31 LAB — MYOCARDIAL PERFUSION IMAGING
LV dias vol: 92 mL (ref 62–150)
LV sys vol: 35 mL
Peak HR: 66 {beats}/min
Rest HR: 44 {beats}/min
SDS: 0
SRS: 0
SSS: 0
TID: 1.23

## 2018-12-31 MED ORDER — REGADENOSON 0.4 MG/5ML IV SOLN
0.4000 mg | Freq: Once | INTRAVENOUS | Status: AC
  Administered 2018-12-31: 0.4 mg via INTRAVENOUS

## 2018-12-31 MED ORDER — TECHNETIUM TC 99M TETROFOSMIN IV KIT
30.8000 | PACK | Freq: Once | INTRAVENOUS | Status: AC | PRN
  Administered 2018-12-31: 30.8 via INTRAVENOUS
  Filled 2018-12-31: qty 31

## 2018-12-31 MED ORDER — TECHNETIUM TC 99M TETROFOSMIN IV KIT
10.5000 | PACK | Freq: Once | INTRAVENOUS | Status: AC | PRN
  Administered 2018-12-31: 10.5 via INTRAVENOUS
  Filled 2018-12-31: qty 11

## 2019-01-01 ENCOUNTER — Telehealth: Payer: Self-pay | Admitting: Cardiovascular Disease

## 2019-01-01 NOTE — Telephone Encounter (Signed)
I informed the patient that his stress test has not been resulted yet by a physician and that we will call him as soon as we get word.  He verbalized understanding.

## 2019-01-01 NOTE — Telephone Encounter (Signed)
Patient is calling for results to his stress test.  

## 2019-01-05 NOTE — Telephone Encounter (Signed)
Reviewed myoview results with patient. He states he continues to have difficulty walking for more than a few minutes without stopping to rest. He has a history of pulmonary fibrosis also so we discussed symptoms and whether he feels that they are similar to past cardiac symptoms prior to stenting. He states he will continue to monitor and may call his pulmonologist. He would like to see Dr. Acie Fredrickson in person soon so I scheduled him for July 17. He verbalized understanding and agreement with plan and agrees to call back if symptoms worsen. He thanked me for my help.

## 2019-01-05 NOTE — Telephone Encounter (Signed)
Patient returned call for stress test.

## 2019-01-12 ENCOUNTER — Other Ambulatory Visit: Payer: Self-pay

## 2019-01-12 ENCOUNTER — Telehealth: Payer: Self-pay | Admitting: Primary Care

## 2019-01-12 ENCOUNTER — Encounter: Payer: Self-pay | Admitting: Primary Care

## 2019-01-12 ENCOUNTER — Ambulatory Visit (INDEPENDENT_AMBULATORY_CARE_PROVIDER_SITE_OTHER): Payer: Medicare HMO | Admitting: Primary Care

## 2019-01-12 VITALS — BP 138/64 | HR 63 | Temp 97.4°F | Ht 68.0 in | Wt 181.2 lb

## 2019-01-12 DIAGNOSIS — J84112 Idiopathic pulmonary fibrosis: Secondary | ICD-10-CM | POA: Diagnosis not present

## 2019-01-12 LAB — CBC WITH DIFFERENTIAL/PLATELET
Basophils Absolute: 0.1 10*3/uL (ref 0.0–0.1)
Basophils Relative: 1.2 % (ref 0.0–3.0)
Eosinophils Absolute: 0.1 10*3/uL (ref 0.0–0.7)
Eosinophils Relative: 1.6 % (ref 0.0–5.0)
HCT: 37.3 % — ABNORMAL LOW (ref 39.0–52.0)
Hemoglobin: 12.3 g/dL — ABNORMAL LOW (ref 13.0–17.0)
Lymphocytes Relative: 19 % (ref 12.0–46.0)
Lymphs Abs: 1.3 10*3/uL (ref 0.7–4.0)
MCHC: 33 g/dL (ref 30.0–36.0)
MCV: 85.4 fl (ref 78.0–100.0)
Monocytes Absolute: 0.8 10*3/uL (ref 0.1–1.0)
Monocytes Relative: 11.6 % (ref 3.0–12.0)
Neutro Abs: 4.6 10*3/uL (ref 1.4–7.7)
Neutrophils Relative %: 66.6 % (ref 43.0–77.0)
Platelets: 118 10*3/uL — ABNORMAL LOW (ref 150.0–400.0)
RBC: 4.37 Mil/uL (ref 4.22–5.81)
RDW: 15.1 % (ref 11.5–15.5)
WBC: 6.9 10*3/uL (ref 4.0–10.5)

## 2019-01-12 LAB — HEPATIC FUNCTION PANEL
ALT: 11 U/L (ref 0–53)
AST: 16 U/L (ref 0–37)
Albumin: 3.8 g/dL (ref 3.5–5.2)
Alkaline Phosphatase: 73 U/L (ref 39–117)
Bilirubin, Direct: 0.1 mg/dL (ref 0.0–0.3)
Total Bilirubin: 0.7 mg/dL (ref 0.2–1.2)
Total Protein: 7.1 g/dL (ref 6.0–8.3)

## 2019-01-12 NOTE — Telephone Encounter (Signed)
-----   Message from Rigoberto Noel, MD sent at 01/12/2019  1:24 PM EDT ----- Regarding: RE: IPF patient wants to change to esbriet Okay to change.Be sure to discuss side effects with him  RA  ----- Message ----- From: Martyn Ehrich, NP Sent: 01/12/2019  11:31 AM EDT To: Rigoberto Noel, MD Subject: IPF patient wants to change to esbriet         Saw patient for for IPF follow-up. He has increased fatigue. He stopped OFEV last week on his own d/t diarrhea and cost of medication. He would like to change to esbriet. He has not been taking imodium. Last PFTs in nov with decreased FVC. Ordering LFTs and due for repeat PFTs.   Are you ok with changing to esbriet?

## 2019-01-12 NOTE — Telephone Encounter (Signed)
PA for Patrick Knapp has been approved until 07/09/2019. This was done via [email or web address]. Key is [id number].

## 2019-01-12 NOTE — Telephone Encounter (Signed)
Will forward to [place] to follow up on when patient signs paperwork.

## 2019-01-12 NOTE — Telephone Encounter (Signed)
Can we start paper work to change patient from [facility] to [employer]. Thanks

## 2019-01-12 NOTE — Telephone Encounter (Signed)
Called and spoke with patient in regards to changing from [place] to [place]. I advised him that the providers have given the ok to switch and that we had some paperwork that he needed to sign. He expressed that he is awaiting a call from [facility] about a foundation that can potentially help him with this new medication because the other foundation that he was with can no longer help him on another medication. He states that he will call the office once he hears back and in the meantime he will continue his OFEV. The paperwork has been placed up front for him to sign.   Will make Beth aware.

## 2019-01-12 NOTE — Assessment & Plan Note (Addendum)
-   Patient is interested in changing from [facility] to Esbriet d/t diarrhea symptoms. Discussed with Dr. Elsworth Soho and this is ok with him. Patient is aware of side effects including nausea/stomach pain/diarrhea/rash. Will start process of changing to Esbriet - HRCT in feb 2020 showed moderate pulmonary fibrosis with no sig change since 2018 - Ambulatory O2 low today 90% RA with reports of moderate fatigue after 3 laps - Due for LFTs and repeat PFTs - Return in 3 months

## 2019-01-12 NOTE — Telephone Encounter (Signed)
Will await for patient to sign forms. Will go ahead and send in the prescribing information to [facility] for him to get started on medication.   Will also see if I can go ahead and get the prior authorization started.

## 2019-01-12 NOTE — Patient Instructions (Addendum)
Fatigue likely related to pulmonary fibrosis, continue to stay as active as possible   Office testing: - Ambulatory O2- 90% after 3 laps (no need for oxygen today)  Recommendations: - Will discuss changing you from OFEV to [place] with Dr. Elsworth Soho - Until then recommend resuming OFEV 100mg  twice daily - Take imodium daily for diarrhea (Take 2mg  4 times daily- do not exceed 8mg  total)  Orders: - Labs today (Liver and cbc) - Needs PFTs re: IPF  Follow up  - 3 months with Dr. Elsworth Soho  - Cardiology as scheduled

## 2019-01-12 NOTE — Telephone Encounter (Signed)
Forms have been faxed 

## 2019-01-12 NOTE — Progress Notes (Signed)
 @Patient  ID: Patrick Knapp, male    DOB: 01/13/1928, 85 y.o.   MRN: [id number]  Chief Complaint  Patient presents with  . Follow-up    3 mo f/u for IPF. Increased fatigue. Had a full workup w/cardiology, Dr. Elease HashimotoNahser. Was told that everything came back normal.     Referring provider: Chilton GreathouseAvva, Ravisankar, MD  HPI: 85 year old male, never smoked. PMH significant for HTN, CAD, IPF, GERD, OA, hyperlipemia. Patient of Dr. Vassie LollAlva, last seen in November 2019. He was started on OFEV in February 2019 for decrease in FVC. Dose decreased to 100mg  BID d/t diarrhea symptoms. Encouraged patient take imodium daily.   01/12/2019 Patient presents today for 7-8 month follow-up visit. Reports increased fatigue. Self stopped taking ofev 1 week ago d/t diarrhea. He has not tried imodium daily. He is also concerned with cost of ofev. He is interested in seeing if he can change to Esbriet. He is aware side effects are nausea. HRCT in feb showed moderate pulmonary fibrosis with no sig change since 2018. Due for repeat liver function and PFTs. Ambulatory O2 90% after 3 laps today. Stopped d/t fatigue. Recovers with rest. Saw cardiology and had normal echocardiogram and stress test. Has follow-up apt in 3 weeks with Dr. Harvie BridgeNahser's office.    Significant tests/ events reviewed  CT chest 2011 showed diffuse right greater than left peripheral and subpleural reticular fibrosis. Ambulatory O2 saturation on room air without desaturation 12/28/2016 ANA , CCP neg  [id number] interstitial lung disease with mild honeycombing. Slight basilar predominance and slight asymmetrical involvement of the right lung with some progression since 2011.  PFTs 01/2017 no obs, FEV1 96%, FVC 76%   DLCO was noted 57% in 04/2017.    Spirometry12/2018slight drop in FVC 68%   11/2017 FVC 65%  LFTs from 02/2018 were normal, of note he is also on Lipitor  FVC 49% 05/2018   HRCT 08/13/18- Moderate pulmonary fibrosis with  apical to basilar gradient featuring irregular peripheral interstitial opacity, bronchiolectasis, and mild traction bronchiectasis without clear evidence of honeycombing. Findings are not significantly changed compared to prior CT dated 01/01/2017 and have mildly progressed compared to remote prior CT dated 08/09/2009.  Echocardiogram 12/24/18 - EF 60-65%, normal systolic function   MPI spect 8/29/566/24/20 - No ischemia, normal LV function   Allergies  Allergen Reactions  . Chlorhexidine Gluconate Itching  . Iodinated Diagnostic Agents     Not really sure of the incident// patient denies allergy and has had catherterization without incident.    Immunization History  Administered Date(s) Administered  . Influenza Whole 04/22/2017  . Influenza, High Dose Seasonal PF 04/08/2016    Past Medical History:  Diagnosis Date  . Arthritis    "all the joints" (02/06/2016)  . Coronary artery disease    a. PCI with DES to mid LAD 7/17 b.post PTCA and stenting of his left circumflex artery in 12/08  . Enlarged prostate   . GERD (gastroesophageal reflux disease)   . Hyperlipidemia   . Hypertension   . Ischemia   . Pulmonary fibrosis, unspecified (HCC)   . Shortness of breath   . Skin disorder    Occurs at times as rash, whelps, blister-like areas  with itching sometimes-has occurred for years , but can not find an exact dx or cause    Tobacco History: Social History   Tobacco Use  Smoking Status Never Smoker  Smokeless Tobacco Never Used   Counseling given: Not Answered   Outpatient Medications Prior to  Visit  Medication Sig Dispense Refill  . ALPRAZolam (XANAX) 0.5 MG tablet Take 0.5 mg by mouth 3 (three) times daily as needed. AS NEEDED FOR ANXIETY    . amLODipine (NORVASC) 2.5 MG tablet TAKE 1 TABLET EVERY DAY 90 tablet 3  . atorvastatin (LIPITOR) 20 MG tablet Take 20 mg by mouth daily.     . benzonatate (TESSALON PERLES) 100 MG capsule Take 2 capsules (200 mg total) by mouth every 8  (eight) hours as needed for cough. 90 capsule 3  . cholecalciferol (VITAMIN D) 1000 units tablet Take 1,000 Units by mouth daily.    . clopidogrel (PLAVIX) 75 MG tablet Take 1 tablet (75 mg total) by mouth daily. 30 tablet 11  . doxazosin (CARDURA) 4 MG tablet Take 2 mg by mouth every morning.     . isosorbide mononitrate (IMDUR) 30 MG 24 hr tablet TAKE 1 TABLET EVERY DAY 90 tablet 3  . magnesium gluconate (MAGONATE) 500 MG tablet Take 500 mg by mouth daily.    . metoprolol tartrate (LOPRESSOR) 50 MG tablet TAKE 1/2  TABLET 2 (TWO) TIMES DAILY. PLEASE KEEP UPCOMING APPT IN NOVEMBER FOR FUTURE REFILLS. THANK YOU. 90 tablet 0  . Nintedanib (OFEV) 100 MG CAPS Take 1 capsule (100 mg total) by mouth 2 (two) times daily. 60 capsule 10  . nitroGLYCERIN (NITROSTAT) 0.4 MG SL tablet Place 1 tablet (0.4 mg total) under the tongue every 5 (five) minutes as needed for chest pain. 25 tablet 3   No facility-administered medications prior to visit.    Review of Systems  Review of Systems  Constitutional: Positive for fatigue.  Respiratory: Positive for shortness of breath. Negative for cough and wheezing.   Cardiovascular: Negative.    Physical Exam  BP 138/64 (BP Location: Left Arm, Patient Position: Sitting, Cuff Size: Normal)   Pulse 63   Temp (!) 97.4 F (36.3 C) (Oral)   Ht 5\' 8"  (1.727 m)   Wt 181 lb 3.2 oz (82.2 kg)   SpO2 96%   BMI 27.55 kg/m  Physical Exam Constitutional:      General: He is not in acute distress.    Appearance: Normal appearance. He is normal weight.  HENT:     Head: Normocephalic and atraumatic.  Cardiovascular:     Rate and Rhythm: Normal rate and regular rhythm.  Pulmonary:     Effort: Pulmonary effort is normal. No respiratory distress.     Breath sounds: No wheezing or rhonchi.     Comments: Fine crackles bases R>L; dyspnea on exertion  O2 96% RA  Musculoskeletal: Normal range of motion.  Skin:    General: Skin is warm and dry.  Neurological:      General: No focal deficit present.     Mental Status: He is alert and oriented to person, place, and time. Mental status is at baseline.  Psychiatric:        Mood and Affect: Mood normal.        Behavior: Behavior normal.        Thought Content: Thought content normal.        Judgment: Judgment normal.      Lab Results:  CBC    Component Value Date/Time   WBC 7.2 02/07/2016 0256   RBC 4.83 02/07/2016 0256   HGB 13.6 02/07/2016 0256   HCT 42.3 02/07/2016 0256   PLT 105 (L) 02/07/2016 0256   MCV 87.6 02/07/2016 0256   MCH 28.2 02/07/2016 0256   MCHC 32.2 02/07/2016  0256   RDW 13.8 02/07/2016 0256   LYMPHSABS 0.2 (L) 08/09/2009 0400   MONOABS 0.5 08/09/2009 0400   EOSABS 0.0 08/09/2009 0400   BASOSABS 0.0 08/09/2009 0400    BMET    Component Value Date/Time   NA 141 03/26/2016 0747   K 4.2 03/26/2016 0747   CL 106 03/26/2016 0747   CO2 26 03/26/2016 0747   GLUCOSE 108 (H) 03/26/2016 0747   BUN 22 03/26/2016 0747   CREATININE 1.21 (H) 03/26/2016 0747   CALCIUM 9.1 03/26/2016 0747   GFRNONAA 49 (L) 02/07/2016 0256   GFRAA 57 (L) 02/07/2016 0256    BNP No results found for: BNP  ProBNP No results found for: PROBNP  Imaging: No results found.   Assessment & Plan:   IPF (idiopathic pulmonary fibrosis) (HCC) - [facility] d/t diarrhea symptoms. Discussed with Dr. Vassie LollAlva and this is ok with him. Patient is aware of side effects including nausea/stomach pain/diarrhea/rash. Will start process of changing to Esbriet - HRCT in feb 2020 showed moderate pulmonary fibrosis with no sig change since 2018 - Ambulatory O2 low today 90% RA with reports of moderate fatigue after 3 laps - Due for LFTs and repeat PFTs - Return in 3 months    Glenford BayleyElizabeth W Calen Posch, NP 01/12/2019

## 2019-01-12 NOTE — Telephone Encounter (Signed)
Thank you :)

## 2019-01-13 NOTE — Telephone Encounter (Signed)
Patient came by to sign paperwork. Will fax paperwork to [employer] today. Will keep encounter open for follow up.

## 2019-01-16 ENCOUNTER — Encounter: Payer: Self-pay | Admitting: *Deleted

## 2019-01-16 DIAGNOSIS — Z006 Encounter for examination for normal comparison and control in clinical research program: Secondary | ICD-10-CM

## 2019-01-16 NOTE — Research (Signed)
LEADERS FREE II Research study: 36 month telephone follow up completed. Patient denies any chest pain or other adverse events. Stated he has been doing well. He denies any changes in his medication. I thanked him for his participation and updated him about the stent. ( shows superiority of the polymer free DCS).

## 2019-01-20 ENCOUNTER — Telehealth: Payer: Self-pay | Admitting: Pulmonary Disease

## 2019-01-20 NOTE — Telephone Encounter (Signed)
Pt just received Esbriet today. How would you like for him to proceed taking? Pt has read side effects as well and is concerned about #1 side effect. Upper respiratory infection.. Please advise.

## 2019-01-21 ENCOUNTER — Other Ambulatory Visit: Payer: Self-pay

## 2019-01-21 ENCOUNTER — Telehealth: Payer: Self-pay | Admitting: Cardiovascular Disease

## 2019-01-21 ENCOUNTER — Encounter: Payer: Self-pay | Admitting: Primary Care

## 2019-01-21 ENCOUNTER — Ambulatory Visit (INDEPENDENT_AMBULATORY_CARE_PROVIDER_SITE_OTHER): Payer: Medicare HMO | Admitting: Primary Care

## 2019-01-21 DIAGNOSIS — J84112 Idiopathic pulmonary fibrosis: Secondary | ICD-10-CM

## 2019-01-21 NOTE — Telephone Encounter (Signed)
Today visit can be telephone call, no need for in office visit. He was just here. Please change

## 2019-01-21 NOTE — Patient Instructions (Addendum)
Start Esbriet 01/22/19 ( 267mg  TID x 7 days; 534mg  TID x 7 days; then 801mg  TID)  Please have labs (Liver function test) in 1 month  Take over the counter imodium if you develop diarrhea.  Take over the counter Pepto-bismol and peppermint candies for nausea (if needed we can send in zofran prescription)  Follow-up 4-6 weeks with Dr. Elsworth Soho

## 2019-01-21 NOTE — Telephone Encounter (Signed)
Attempted to call pt to let him know that Patrick Knapp said visit today can be a phone visit instead of him coming to office for appt. Stated in the message that this was going to be changed to a phone visit and asked in the message for pt to return asap so we know he is aware of this.  Routing to [place] as an FYI that this has been changed but was unable to reach pt but message has been left in regards to this and also asked for pt to call us back so we know he had received the message.

## 2019-01-21 NOTE — Telephone Encounter (Signed)
Patient had a televisit with Beth today. Patient has already received his first shipment. Will close this encounter since the process has been completed.

## 2019-01-21 NOTE — Telephone Encounter (Signed)
New Message ° ° ° °Left message to confirm appt and answer covid questions  °

## 2019-01-21 NOTE — Progress Notes (Signed)
Virtual Visit via Telephone Note  I connected with KARINA LENDERMAN on 01/21/19 at  2:30 PM EDT by telephone and verified that I am speaking with the correct person using two identifiers.  Location: Patient: Home Provider: Office   I discussed the limitations, risks, security and privacy concerns of performing an evaluation and management service by telephone and the availability of in person appointments. I also discussed with the patient that there may be a patient responsible charge related to this service. The patient expressed understanding and agreed to proceed.   History of Present Illness: 85 year old male, never smoked. PMH significant for HTN, CAD, IPF, GERD, OA, hyperlipemia. Patient of Dr. Elsworth Soho, last seen in November 2019. He was started on OFEV in February 2019 for decrease in FVC. Dose decreased to 100mg  BID d/t diarrhea symptoms. Encouraged patient take imodium daily.   Previous [place] Encounter: 01/12/2019 Patient presents today for 7-8 month follow-up visit. Reports increased fatigue. Self stopped taking ofev 1 week ago d/t diarrhea. He has not tried imodium daily. He is also concerned with cost of ofev. He is interested in seeing if he can change to Esbriet. He is aware side effects are nausea. HRCT in feb showed moderate pulmonary fibrosis with no sig change since 2018. Due for repeat liver function and PFTs. Ambulatory O2 90% after 3 laps today. Stopped d/t fatigue. Recovers with rest. Saw cardiology and had normal echocardiogram and stress test. Has follow-up apt in 3 weeks with Dr. Elmarie Shiley office.   01/21/2019 Patient contacted today to review Esbriet medication. He was approved to start medication. Reviewed dosing information with patient and potential side effects. He has not taken OFEV in 10 days. He would like to start Esbriet. He is aware to take immodium if develops diarrhea and discuss over the counter medication for potential nausea symptoms.   Significant tests/  events reviewed  PFTs 01/2017 no obs, FEV1 96%, FVC 76%   DLCO was noted 57% in 04/2017.    Spirometry12/2018slight drop in FVC 68%   11/2017 FVC 65%  LFTs from 02/2018 were normal, of note he is also on Lipitor  FVC 49% 05/2018   HRCT 08/13/18- Moderate pulmonary fibrosis with apical to basilar gradient featuring irregular peripheral interstitial opacity, bronchiolectasis, and mild traction bronchiectasis without clear evidence of honeycombing. Findings are not significantly changed compared to prior CT dated 01/01/2017 and have mildly progressed compared to remote prior CT dated 08/09/2009.  Observations/Objective:  - No shortness of breath, wheezing or cough noted during phone conversation  Assessment and Plan:  IPF - Starting Esbriet on 01/22/19 (267mg  TID x 7 days; 534mg  TID x 7 days; then 801mg  TID) - Patient is aware of all listed potential side effects, reviewed at length  - Advised imodium prn if develops diarrhea. Pepto-bismol OTC and peppermint candies for nausea . If needed can send in zofran rx - Check LFTs in 1 month   Follow Up Instructions:   4-6 weeks with Dr. Elsworth Soho   I discussed the assessment and treatment plan with the patient. The patient was provided an opportunity to ask questions and all were answered. The patient agreed with the plan and demonstrated an understanding of the instructions.   The patient was advised to call back or seek an in-person evaluation if the symptoms worsen or if the condition fails to improve as anticipated.  I provided 18 minutes of non-face-to-face time during this encounter.   Martyn Ehrich, NP

## 2019-01-21 NOTE — Telephone Encounter (Signed)
Called and spoke with pt letting him know that Beth wanted [place] to schedule OV for him to come in office to discuss Esbriet med. Pt verbalized understanding. Pt has been scheduled for an appt at 2:30 with Beth. Nothing further needed.

## 2019-01-21 NOTE — Telephone Encounter (Signed)
Can you put him on my scheduled to call him to discuss starting Esbriet tomorrow morning.

## 2019-01-22 NOTE — Telephone Encounter (Signed)
Pt's visit 7/15 was a televisit as changed. Nothing further needed.

## 2019-01-23 ENCOUNTER — Ambulatory Visit: Payer: Medicare HMO | Admitting: Cardiovascular Disease

## 2019-01-23 NOTE — Progress Notes (Deleted)
Patrick Knapp Date of Birth  1928-01-05 [facility] [address]    [address] [place]  [id number] [phone]  Fax  [phone]   Problem list: 1. Coronary artery disease 2. Hypertension 3. Hyperlipidemia 4. BPH   Patrick Knapp is an 85 y.o.  with a hx of CAD, HTN, hyperlipidemia, and BPH.  He is not had any episodes of chest pain or shortness breath. He's exploring some alternate therapies for his BPH including cryotherapy.  He works at [employer] for 4 hours every day. He does not get any other regular exercise.  Sept. 9, 2015:  Patrick Knapp has had some DOE walking [place] the airport [place] .   No CP -   Similar to how he felt prior to stenting  He has a right knee injury - needs knee replacement on Nov. 23. ( [place])  Has not been able to exercise.    January 23, 2016:  Patrick Knapp is seen today after a 2 year absence.   Seen with his daughter, Patrick Knapp  He complains of lots of fatigue. His last Myoview study was 03/25/2016. He had no evidence of ischemia at that time and had normal left ventricle systolic function.  Has severe exhaustion with any exertion .   Has lots of DOE   thinks that he feels similar to when he had his stent placed . He has never had CP - even prior to stenting  His DOE seems to be worsening   DOE even walking to his mail box   Jun 04, 2016:  Patrick Knapp had PCI ( [employer])   of his LAD in July . Doing much better  Still having DOE and fatigue walking up hills  Had [facility] to [facility] about starting Imdur if needed.   Nov 27, 2016:  Doing well.  Has some exertional leg fatigue.  No CP or dyspnea.   Nov. 20, 2019  No CP  Has some DOE with walking up a hill.  Has IPF  July 17 , 2020   Patrick Knapp is seen today for follow up of his fatigue and dyspnea.   Echo showed normal LV systolic functon - EF 60-65%.  Grade 1 diastolic dysfunction .  Mild - mod TR . Trivial AI  Myoview study reveals no evidence of ischemia.  He has normal left  ventricular systolic function.  Current Outpatient Medications on File Prior to Visit  Medication Sig Dispense Refill  . ALPRAZolam (XANAX) 0.5 MG tablet Take 0.5 mg by mouth 3 (three) times daily as needed. AS NEEDED FOR ANXIETY    . amLODipine (NORVASC) 2.5 MG tablet TAKE 1 TABLET EVERY DAY 90 tablet 3  . atorvastatin (LIPITOR) 20 MG tablet Take 20 mg by mouth daily.     . benzonatate (TESSALON PERLES) 100 MG capsule Take 2 capsules (200 mg total) by mouth every 8 (eight) hours as needed for cough. 90 capsule 3  . cholecalciferol (VITAMIN D) 1000 units tablet Take 1,000 Units by mouth daily.    . clopidogrel (PLAVIX) 75 MG tablet Take 1 tablet (75 mg total) by mouth daily. 30 tablet 11  . doxazosin (CARDURA) 4 MG tablet Take 2 mg by mouth every morning.     . isosorbide mononitrate (IMDUR) 30 MG 24 hr tablet TAKE 1 TABLET EVERY DAY 90 tablet 3  . magnesium gluconate (MAGONATE) 500 MG tablet Take 500 mg by mouth daily.    . metoprolol tartrate (LOPRESSOR) 50 MG tablet TAKE 1/2  TABLET 2 (TWO) TIMES DAILY. PLEASE KEEP UPCOMING APPT IN NOVEMBER FOR FUTURE REFILLS. THANK YOU. 90 tablet 0  . Nintedanib (OFEV) 100 MG CAPS Take 1 capsule (100 mg total) by mouth 2 (two) times daily. 60 capsule 10  . nitroGLYCERIN (NITROSTAT) 0.4 MG SL tablet Place 1 tablet (0.4 mg total) under the tongue every 5 (five) minutes as needed for chest pain. 25 tablet 3   No current facility-administered medications on file prior to visit.     Allergies  Allergen Reactions  . Chlorhexidine Gluconate Itching  . Iodinated Diagnostic Agents     Not really sure of the incident// patient denies allergy and has had catherterization without incident.    Past Medical History:  Diagnosis Date  . Arthritis    "all the joints" (02/06/2016)  . Coronary artery disease    a. PCI with DES to mid LAD 7/17 b.post PTCA and stenting of his left circumflex artery in 12/08  . Enlarged prostate   . GERD (gastroesophageal reflux  disease)   . Hyperlipidemia   . Hypertension   . Ischemia   . Pulmonary fibrosis, unspecified (HCC)   . Shortness of breath   . Skin disorder    Occurs at times as rash, whelps, blister-like areas  with itching sometimes-has occurred for years , but can not find an exact dx or cause    Past Surgical History:  Procedure Laterality Date  . CARDIAC CATHETERIZATION N/A 02/06/2016   Procedure: Right/Left Heart Cath and Coronary Angiography;  Surgeon: Tonny BollmanMichael Cooper, MD;  [facility];  Service: Cardiovascular;  Laterality: N/A;  . CATARACT EXTRACTION, BILATERAL Bilateral 2016  . CORONARY ANGIOPLASTY WITH STENT PLACEMENT  06/2007   Est. EF of 55-60%PTCA and stenting of his left circumflex artery in 12/08  . EYE SURGERY    . INGUINAL HERNIA REPAIR Left   . JOINT REPLACEMENT    . KNEE ARTHROSCOPY  1990s  . ORBITAL FRACTURE SURGERY Left 1957  . TONSILLECTOMY    . TOTAL KNEE ARTHROPLASTY Right 05/31/2014   Procedure: RIGHT TOTAL KNEE ARTHROPLASTY;  Surgeon: Loanne DrillingFrank Aluisio V, MD;  Location: [facility] ORS;  Service: Orthopedics;  Laterality: Right;    Social History   Tobacco Use  Smoking Status Never Smoker  Smokeless Tobacco Never Used    Social History   Substance and Sexual Activity  Alcohol Use Yes   Comment: 02/06/2016 "might have a glass of beer or wine once/month"    Family History  Problem Relation Age of Onset  . Heart failure Mother        possible  . Heart disease Mother   . Heart disease Father   . Coronary artery disease Brother   . Heart disease Brother     Reviw of Systems:  Reviewed in the HPI.  All other systems are negative.  Physical Exam: There were no vitals taken for this visit.  GEN:  Well nourished, well developed in no acute distress HEENT: Normal NECK: No JVD; No carotid bruits LYMPHATICS: No lymphadenopathy CARDIAC: RRR ***, no murmurs, rubs, gallops RESPIRATORY:  Clear to auscultation without rales, wheezing or rhonchi  ABDOMEN:  Soft, non-tender, non-distended MUSCULOSKELETAL:  No edema; No deformity  SKIN: Warm and dry NEUROLOGIC:  Alert and oriented x 3   ECG: May 28, 2018: Normal sinus rhythm at 61.  Normal EKG.  Assessment / Plan:    1. Coronary artery disease:  .   2. Hypertension -     3. Hyperlipidemia -  4. BPH    Mertie Moores, MD   01/23/2019 8:09 AM    [address],  [place], [place]  [address] Pager [phone] Phone: [phone]; Fax: [phone]

## 2019-02-24 DIAGNOSIS — Z125 Encounter for screening for malignant neoplasm of prostate: Secondary | ICD-10-CM | POA: Diagnosis not present

## 2019-02-24 DIAGNOSIS — Z23 Encounter for immunization: Secondary | ICD-10-CM | POA: Diagnosis not present

## 2019-02-24 DIAGNOSIS — M859 Disorder of bone density and structure, unspecified: Secondary | ICD-10-CM | POA: Diagnosis not present

## 2019-02-24 DIAGNOSIS — E7849 Other hyperlipidemia: Secondary | ICD-10-CM | POA: Diagnosis not present

## 2019-03-03 DIAGNOSIS — K635 Polyp of colon: Secondary | ICD-10-CM | POA: Diagnosis not present

## 2019-03-03 DIAGNOSIS — G4700 Insomnia, unspecified: Secondary | ICD-10-CM | POA: Diagnosis not present

## 2019-03-03 DIAGNOSIS — Z0000 Encounter for general adult medical examination without abnormal findings: Secondary | ICD-10-CM | POA: Diagnosis not present

## 2019-03-03 DIAGNOSIS — J8410 Pulmonary fibrosis, unspecified: Secondary | ICD-10-CM | POA: Diagnosis not present

## 2019-03-03 DIAGNOSIS — E669 Obesity, unspecified: Secondary | ICD-10-CM | POA: Diagnosis not present

## 2019-03-03 DIAGNOSIS — R82998 Other abnormal findings in urine: Secondary | ICD-10-CM | POA: Diagnosis not present

## 2019-03-03 DIAGNOSIS — N400 Enlarged prostate without lower urinary tract symptoms: Secondary | ICD-10-CM | POA: Diagnosis not present

## 2019-03-03 DIAGNOSIS — K219 Gastro-esophageal reflux disease without esophagitis: Secondary | ICD-10-CM | POA: Diagnosis not present

## 2019-03-03 DIAGNOSIS — I25119 Atherosclerotic heart disease of native coronary artery with unspecified angina pectoris: Secondary | ICD-10-CM | POA: Diagnosis not present

## 2019-03-03 DIAGNOSIS — E785 Hyperlipidemia, unspecified: Secondary | ICD-10-CM | POA: Diagnosis not present

## 2019-03-04 ENCOUNTER — Other Ambulatory Visit: Payer: Self-pay | Admitting: Pulmonary Disease

## 2019-03-04 DIAGNOSIS — J84112 Idiopathic pulmonary fibrosis: Secondary | ICD-10-CM

## 2019-03-23 ENCOUNTER — Ambulatory Visit: Payer: Non-veteran care | Admitting: Cardiovascular Disease

## 2019-04-09 ENCOUNTER — Telehealth: Payer: Self-pay | Admitting: Pulmonary Disease

## 2019-04-09 NOTE — Telephone Encounter (Signed)
Call returned to [place] with [facility], she states the patient is requesting a portable oxgen and she needed to verify oxygen saturations. I made her aware I would need to contact the patient and verify this info. Voiced understanding.   According to recent AVS patient did not qualify for oxygen. Will need to speak with patient to verify that he requested oxygen from [facility] as well as to make him aware he did not qualify (trying to purchase out of pocket).   Will hold message until patient calls back.

## 2019-04-10 ENCOUNTER — Telehealth: Payer: Self-pay | Admitting: Pulmonary Disease

## 2019-04-10 NOTE — Telephone Encounter (Signed)
LMTCB

## 2019-04-10 NOTE — Telephone Encounter (Signed)
ATC, NA and no option to leave msg 

## 2019-04-10 NOTE — Telephone Encounter (Signed)
ATC, NA and mailbox full again

## 2019-04-10 NOTE — Telephone Encounter (Signed)
ATC, NA and mailbox full again 

## 2019-04-10 NOTE — Telephone Encounter (Signed)
Pt returning call - he can be reached at [phone]

## 2019-04-10 NOTE — Telephone Encounter (Signed)
Pt returning call.  [phone].

## 2019-04-10 NOTE — Telephone Encounter (Signed)
ATC, NA and his VM box was full so unable to leave msg, [facility]

## 2019-04-13 NOTE — Telephone Encounter (Signed)
Pt calling back about getting an order for oxygen.  [phone] (pt's wife's cell).

## 2019-04-13 NOTE — Telephone Encounter (Signed)
msg already left for the pt to call back today

## 2019-04-13 NOTE — Telephone Encounter (Signed)
LMTCB

## 2019-04-13 NOTE — Telephone Encounter (Signed)
Pt's wife returning call.  [phone].

## 2019-04-13 NOTE — Telephone Encounter (Signed)
Spoke with and his wife. States that he contacted this company for a price quote ONLY. He did not want to purchase anything from them. Contacted [facility] and made them aware of this. Nothing further was needed.

## 2019-04-13 NOTE — Telephone Encounter (Signed)
LMTCB x1 for pt.  

## 2019-04-14 NOTE — Telephone Encounter (Signed)
LMTCB x3 for pt. We have attempted to contact pt several times with no success or call back from pt. Per triage protocol, message will be closed.   

## 2019-04-15 ENCOUNTER — Telehealth: Payer: Medicare HMO | Admitting: Cardiovascular Disease

## 2019-04-15 DIAGNOSIS — B029 Zoster without complications: Secondary | ICD-10-CM | POA: Diagnosis not present

## 2019-04-16 ENCOUNTER — Telehealth: Payer: Self-pay | Admitting: Pulmonary Disease

## 2019-04-16 NOTE — Telephone Encounter (Signed)
Called and spoke to Sarah with [facility]. She inquired if pt was on O2. Advised her we havent recently seen pt and cannot disclose any information without authorization. Pt last seen in 01/2019 and advised to f/u in 02/2019. Judson Roch states the pt has been trying to get O2. Called and left VM for pt to schedule visit.

## 2019-04-17 ENCOUNTER — Telehealth: Payer: Self-pay | Admitting: Internal Medicine

## 2019-04-17 NOTE — Telephone Encounter (Signed)
ATC pt, no answer. Left message for pt to call back.  

## 2019-04-17 NOTE — Telephone Encounter (Signed)
Patient has an appt with RA on 10/22 at 4pm that was made this morning.   Left message for patient to call back.

## 2019-04-17 NOTE — Telephone Encounter (Signed)
Pt returning your call

## 2019-04-17 NOTE — Telephone Encounter (Signed)
Called patient's number as well as wife's cell number to schedule Palliative Consult, no answer - left message at both numbers reason for call along with my contact information.

## 2019-04-20 NOTE — Telephone Encounter (Signed)
Pt returning call.  [phone] (wife's cell)

## 2019-04-20 NOTE — Telephone Encounter (Signed)
Attempted to call patient's wife, Mardene Celeste who is listed on DPR, no answer, left message to call back.

## 2019-04-20 NOTE — Telephone Encounter (Signed)
Attempted to call patient, no answer, left message to call back.  

## 2019-04-21 ENCOUNTER — Other Ambulatory Visit: Payer: Self-pay | Admitting: Pulmonary Disease

## 2019-04-21 ENCOUNTER — Telehealth: Payer: Self-pay | Admitting: Internal Medicine

## 2019-04-21 DIAGNOSIS — J84112 Idiopathic pulmonary fibrosis: Secondary | ICD-10-CM

## 2019-04-21 NOTE — Telephone Encounter (Signed)
Rec'd a call back from patient's wife and she was in agreement with Palliative services.  I have scheduled a Telephone Consult for 04/24/19 @ 12 Noon.

## 2019-04-21 NOTE — Telephone Encounter (Signed)
Called and spoke to pt. Informed pt of the [facility] call, he states he was just calling them to get a price and does not want any medical information released to them. Pt states he doesn't know if he will qualify for O2 (spo2 - mid 90's at rest, low 90's with activity) but wants to try d/t his dyspnea. Pt aware of his 10/22 appt and is aware to call back sooner if needed.

## 2019-04-22 ENCOUNTER — Telehealth: Payer: Self-pay | Admitting: Pulmonary Disease

## 2019-04-22 NOTE — Telephone Encounter (Signed)
Per last in office note 01/12/19 with Derl Barrow, NP:    Office testing: - Ambulatory O2- 90% after 3 laps (no need for oxygen today)  Informed Jeneen Rinks of this and he verbalized understanding  Nothing further needed

## 2019-04-24 ENCOUNTER — Encounter: Payer: Self-pay | Admitting: Internal Medicine

## 2019-04-24 ENCOUNTER — Other Ambulatory Visit: Payer: Medicare HMO | Admitting: Internal Medicine

## 2019-04-24 ENCOUNTER — Other Ambulatory Visit: Payer: Self-pay

## 2019-04-24 DIAGNOSIS — Z515 Encounter for palliative care: Secondary | ICD-10-CM | POA: Diagnosis not present

## 2019-04-24 NOTE — Progress Notes (Signed)
Oct 16th, 2020 [facility] Palliative Care Consult Note Telephone: [phone]  Fax: [phone]   Due to the current COVID-19 infection/crises, the family prefer, and have given their verbal consent for, a provider visit via telemedicine. HIPPA policies of confidentially were discussed. Video-audio (telehealth) contact was unable to be done due technical barriers from the patient's side.  PATIENT NAME: Patrick Knapp DOB: 10/24/1927 MRN: [id number] [address] [place], [place]  [address] Pt:  [phone]  PRIMARY CARE PROVIDER:   Prince Solian, MD Cardiology: Dr. Diona Browner Pulmonary: Geraldo Pitter NP  REFERRING PROVIDER:  Prince Solian, [place],  [place] [address]  RESPONSIBLE PARTY:   Mardene Celeste (wife) [phone] *Call this number for Consult* (dtr) Vaughan Basta (is a Marine scientist) Vaughan Basta) in [place]. ASSESSMENT / RECOMMENDATIONS:  1. Advance Care Planning: A. Directives:  -DNR (completed, uploaded into [facility] EMR, and mailed to patient for his records).  -MOST DNR/DNI, Scope of Medical Interventions: Limited. Yes to IVFs, Yes to antibiotics, no to Tube Feedings. I will fill out MOST according to patient preferences, mail to him for his signature. He will mail back to me, I'll upload into [place], then return original to him for his records. B. Goals of Care: To increased stamina and energy to be able to enjoy his hobbies.  -check with Dr. Elmarie Shiley office results of stress test done back in June; reassurance that safe to begin exercise program (that marked dyspnea is due to deconditioning/lung disease rather than cardiac etiology). -start his in-home exercises (has a program available from past rehab); increase as tolerated. -Call [facility] to check if Pulmonary Rehab Program is still in operation in this time of COVID. If so, [name] plans on calling his VA insurance to see if they will cover another run at rehab. If so,  and if patient feels up to doing this program, he will call his pulmonologist for a referral.   2. Cognitive / Functional status:  A & O x3. Drives. Not on oxygen; oximetry sats running 96% at rest, low 90's with ambulation. He feels there are times he could benefit from oxygen use. Patient is independent in his ADLs, and is able to make his own meals.  Suffered a bout of shingles 1 week ago L side of his back.  Started Acyclovir 1gm bid; completed 7 day course. Residual pruritus / lesions now scabbed over. Good oral intake. (10/14) Weight 191lbs. at a height of 5'8", his BMI is 29kg/m2.   3. Family Supports: spouse Fraser Din married 13yr. no children together; [name] had 4 and Pat had 2 from previous marriages. [name]'s daughter is a Marine scientist Vaughan Basta) in [place] [phone]. Comes by qwk or so to check in. 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   4. Follow up Palliative Care Visit: Friday 06/12/2019 at 10am  I spent 90 minutes providing this consultation from noon to 1:30pm. More than 50% of the time in this consultation was spent coordinating communication.   HISTORY OF PRESENT ILLNESS:  Patrick Knapp 85 year old male, never smoked. PMH significant for HTN, CAD (nl echo/stress test 2020, stent 2008), IPF, GERD, OA, hyperlipemia. Palliative Care was asked to help address goals of care.   CODE STATUS: DNR  PPS: 70%  HOSPICE ELIGIBILITY/DIAGNOSIS: TBD  PAST MEDICAL HISTORY:  Past Medical History:  Diagnosis Date  . Arthritis    "all the joints" (02/06/2016)  .  Coronary artery disease    a. PCI with DES to mid LAD 7/17 b.post PTCA and stenting of his left circumflex artery in 12/08  . Enlarged prostate   . GERD (gastroesophageal reflux disease)   . Hyperlipidemia   . Hypertension   . Ischemia   . Pulmonary fibrosis, unspecified (HCC)   . Shortness of breath   . Skin disorder    Occurs at times as rash, whelps, blister-like areas  with itching  sometimes-has occurred for years , but can not find an exact dx or cause    SOCIAL HX:  Social History   Tobacco Use  . Smoking status: Never Smoker  . Smokeless tobacco: Never Used  Substance Use Topics  . Alcohol use: Yes    Comment: 02/06/2016 "might have a glass of beer or wine once/month"    ALLERGIES:  Allergies  Allergen Reactions  . Chlorhexidine Gluconate Itching  . Iodinated Diagnostic Agents     Not really sure of the incident// patient denies allergy and has had catherterization without incident.     PERTINENT MEDICATIONS:  Outpatient Encounter Medications as of 04/24/2019  Medication Sig  . ALPRAZolam (XANAX) 0.5 MG tablet Take 0.5 mg by mouth 3 (three) times daily as needed. AS NEEDED FOR ANXIETY  . amLODipine (NORVASC) 2.5 MG tablet TAKE 1 TABLET EVERY DAY  . atorvastatin (LIPITOR) 20 MG tablet Take 20 mg by mouth daily.   . benzonatate (TESSALON PERLES) 100 MG capsule Take 2 capsules (200 mg total) by mouth every 8 (eight) hours as needed for cough.  . cholecalciferol (VITAMIN D) 1000 units tablet Take 1,000 Units by mouth daily.  . clopidogrel (PLAVIX) 75 MG tablet Take 1 tablet (75 mg total) by mouth daily.  Marland Kitchen doxazosin (CARDURA) 4 MG tablet Take 2 mg by mouth every morning.   . ESBRIET 267 MG TABS TAKE 3 TABLETS BY MOUTH THREE TIMES A DAY WITH MEALS  . isosorbide mononitrate (IMDUR) 30 MG 24 hr tablet TAKE 1 TABLET EVERY DAY  . magnesium gluconate (MAGONATE) 500 MG tablet Take 500 mg by mouth daily.  . metoprolol tartrate (LOPRESSOR) 50 MG tablet TAKE 1/2  TABLET 2 (TWO) TIMES DAILY. PLEASE KEEP UPCOMING APPT IN NOVEMBER FOR FUTURE REFILLS. THANK YOU.  . Nintedanib (OFEV) 100 MG CAPS Take 1 capsule (100 mg total) by mouth 2 (two) times daily.  . nitroGLYCERIN (NITROSTAT) 0.4 MG SL tablet Place 1 tablet (0.4 mg total) under the tongue every 5 (five) minutes as needed for chest pain.   No facility-administered encounter medications on file as of 04/24/2019.      PHYSICAL EXAM:   PE deferred d/t telehealth/audo only nature of visit  Anselm Lis, NP

## 2019-04-30 ENCOUNTER — Ambulatory Visit (INDEPENDENT_AMBULATORY_CARE_PROVIDER_SITE_OTHER): Payer: Medicare HMO | Admitting: Pulmonary Disease

## 2019-04-30 ENCOUNTER — Encounter: Payer: Self-pay | Admitting: Pulmonary Disease

## 2019-04-30 ENCOUNTER — Other Ambulatory Visit: Payer: Self-pay

## 2019-04-30 VITALS — BP 158/64 | HR 67 | Temp 97.0°F | Ht 68.0 in | Wt 183.8 lb

## 2019-04-30 DIAGNOSIS — J84112 Idiopathic pulmonary fibrosis: Secondary | ICD-10-CM

## 2019-04-30 DIAGNOSIS — Z5181 Encounter for therapeutic drug level monitoring: Secondary | ICD-10-CM

## 2019-04-30 NOTE — Patient Instructions (Signed)
Check LFTs today. 

## 2019-04-30 NOTE — Progress Notes (Signed)
   Subjective:    Patient ID: Patrick Knapp, male    DOB: 10/31/1927, 85 y.o.   MRN: [id number]  HPI  85 yo never smokerFor follow-up ofIdiopathicpulmonary fibrosis.  He was started on OFEV2/2019 forslight drop in lung function/FVCbut this was stopped due to diarrhea in spite of decreasing the dose.  He was started on Esbriet 01/2019 and seems to be tolerating this much better. His dyspnea however has increased over the past few months.  He underwent cardiac evaluation which was normal, so "my lungs must be shot!"  He had a good report on annual physical with PCP and has received his flu shot.  He denies cough or wheezing, but gives out even on minimal exertion.  His wife agreed to palliative care consult but he does not feel that he is ready for this discussion yet He did not desaturate on ambulation today  Significant tests/ events reviewed  HRCT 08/2018 "probable" UIP, no honeycombing, unchanged from 2018  CT chest 2011 showed diffuse right greater than left peripheral and subpleural reticular fibrosis. Ambulatory O2 saturation on room air without desaturation 12/28/2016 ANA , CCP neg  HRCT6/2053fibrotic interstitial lung disease with mild honeycombing. Slight basilar predominance and slight asymmetrical involvement of the right lung with some progression since 2011.  PFTs 01/2017 no obs, FEV1 96%, FVC 76% DLCO was noted 57% in 04/2017.  Spirometry12/2018slight drop in FVC 68% 11/2017 FVC 65% 05/2018 FVC 49 %  Review of Systems neg for any significant sore throat, dysphagia, itching, sneezing, nasal congestion or excess/ purulent secretions, fever, chills, sweats, unintended wt loss, pleuritic or exertional cp, hempoptysis, orthopnea pnd or change in chronic leg swelling. Also denies presyncope, palpitations, heartburn, abdominal pain, nausea, vomiting, diarrhea or change in bowel or urinary habits, dysuria,hematuria, rash, arthralgias, visual complaints,  headache, numbness weakness or ataxia.     Objective:   Physical Exam  Gen. Pleasant, well-nourished, in no distress ENT - no thrush, no pallor/icterus,no post nasal drip Neck: No JVD, no thyromegaly, no carotid bruits Lungs: no use of accessory muscles, no dullness to percussion, bibasal 1/2 rales no rhonchi  Cardiovascular: Rhythm regular, heart sounds  normal, no murmurs or gallops, no peripheral edema Musculoskeletal: No deformities, no cyanosis or clubbing        Assessment & Plan:

## 2019-05-01 LAB — HEPATIC FUNCTION PANEL
ALT: 18 U/L (ref 0–53)
AST: 21 U/L (ref 0–37)
Albumin: 4 g/dL (ref 3.5–5.2)
Alkaline Phosphatase: 87 U/L (ref 39–117)
Bilirubin, Direct: 0.1 mg/dL (ref 0.0–0.3)
Total Bilirubin: 0.4 mg/dL (ref 0.2–1.2)
Total Protein: 7.7 g/dL (ref 6.0–8.3)

## 2019-05-06 ENCOUNTER — Other Ambulatory Visit: Payer: Self-pay | Admitting: Cardiovascular Disease

## 2019-06-12 ENCOUNTER — Other Ambulatory Visit: Payer: Self-pay

## 2019-06-12 ENCOUNTER — Other Ambulatory Visit: Payer: Medicare HMO | Admitting: Internal Medicine

## 2019-06-12 DIAGNOSIS — R0600 Dyspnea, unspecified: Secondary | ICD-10-CM | POA: Diagnosis not present

## 2019-06-12 DIAGNOSIS — Z515 Encounter for palliative care: Secondary | ICD-10-CM | POA: Diagnosis not present

## 2019-06-14 ENCOUNTER — Encounter: Payer: Self-pay | Admitting: Internal Medicine

## 2019-06-14 NOTE — Progress Notes (Signed)
Dec 4th, 2020 [facility] Palliative Care Consult Note Telephone: [phone]  Fax: [phone]     Due to the current COVID-19 infection/crises, the family prefer, and have given their verbal consent for, a provider visit via telemedicine. HIPPA policies of confidentially were discussed. Video-audio (telehealth) contact was unable to be done due technical barriers from the patient's side.   PATIENT NAME: Patrick Knapp DOB: 1928-05-25 MRN: [id number] [address] [place], [place]  [address] Pt:  [phone]   PRIMARY CARE PROVIDER:   Prince Solian, MD Cardiology: Dr. Diona Browner Pulmonary: Dr. Kara Mead / Geraldo Pitter NP   REFERRING PROVIDER:  Prince Solian, [place],  [place] [address]  RESPONSIBLE PARTY:   Mardene Celeste (wife) [phone] *Call this number for Consult* (dtr) Vaughan Basta (is a Marine scientist) Vaughan Basta) in [place].  ASSESSMENT / RECOMMENDATIONS:  1. Advance Care Planning: A. Directives:  -DNR  -MOST DNR/DNI, Scope of Medical Interventions: Limited. Yes to IVFs, Yes to antibiotics, no to Tube Feedings.  B. Goals of Care: To increased stamina and energy to be able to enjoy his hobbies.  -Spouse hesitant to explore pulmonary rehab d/t current COID situation   2. Cognitive / Functional status: Spouse Fraser Din mentions that patient seems a little more "cranky". He continues independent in his ADLs and continues to drive. Good oral intake. Last weight I have on him was 183 lbs on 04/30/2019. At 5'8" his BMI was 29kg/m2. Patient feels a little more SOB and weak with activity, over these lase 3 months. I discussed with Fraser Din to check his oxygen saturations with ambulation; she knows patient would qualify if ambulatory sats decrease to 88% or less.    3. Family Supports: spouse Fraser Din married 20yr. no children together; [name] had 4 and Pat had 2 from previous marriages. [name]'s daughter is a Marine scientist Vaughan Basta) in [place] [phone]. Comes by qwk or  so to check in. 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  Patient's spouse had many questions regarding hospice admission, and what she might expect as patient's condition declines. We discussed this issues in some detail.    4. Follow up Palliative Care Visit: To be scheduled.   I spent 30 minutes providing this consultation from10am to 10:30am. More than 50% of the time in this consultation was spent coordinating communication.    HISTORY OF PRESENT ILLNESS:  Patrick Knapp 85 year old male, never smoked. PMH significant for HTN, CAD (nl echo/stress test 2020, stent 2008), IPF, GERD, OA, hyperlipemia. This is a f/u Palliative Care visit from Oct 16th.     CODE STATUS: DNR   PPS: 70%   HOSPICE ELIGIBILITY/DIAGNOSIS: TBD  PAST MEDICAL HISTORY:  Past Medical History:  Diagnosis Date  . Arthritis    "all the joints" (02/06/2016)  . Coronary artery disease    a. PCI with DES to mid LAD 7/17 b.post PTCA and stenting of his left circumflex artery in 12/08  . Enlarged prostate   . GERD (gastroesophageal reflux disease)   . Hyperlipidemia   . Hypertension   . Ischemia   . Pulmonary fibrosis, unspecified ([place])   . Shortness of breath   . Skin disorder    Occurs at times as rash, whelps, blister-like areas  with itching sometimes-has occurred for years , but can not find an exact dx or cause    SOCIAL HX:  Social History   Tobacco Use  . Smoking  status: Never Smoker  . Smokeless tobacco: Never Used  Substance Use Topics  . Alcohol use: Yes    Comment: 02/06/2016 "might have a glass of beer or wine once/month"    ALLERGIES:  Allergies  Allergen Reactions  . Chlorhexidine Gluconate Itching  . Iodinated Diagnostic Agents     Not really sure of the incident// patient denies allergy and has had catherterization without incident.     PERTINENT MEDICATIONS:  Outpatient Encounter Medications as of 06/12/2019  Medication Sig  .  acetaminophen (TYLENOL) 650 MG CR tablet Take 650 mg by mouth every 8 (eight) hours as needed for pain.  Marland Kitchen ALPRAZolam (XANAX) 0.5 MG tablet Take 0.5 mg by mouth 3 (three) times daily as needed. AS NEEDED FOR ANXIETY; taking at bedtime only for insomnia  . amLODipine (NORVASC) 2.5 MG tablet TAKE 1 TABLET EVERY DAY  . atorvastatin (LIPITOR) 20 MG tablet Take 20 mg by mouth daily.   . benzonatate (TESSALON PERLES) 100 MG capsule Take 2 capsules (200 mg total) by mouth every 8 (eight) hours as needed for cough.  . cholecalciferol (VITAMIN D) 1000 units tablet Take 1,000 Units by mouth daily.  . clopidogrel (PLAVIX) 75 MG tablet Take 1 tablet (75 mg total) by mouth daily.  Marland Kitchen doxazosin (CARDURA) 4 MG tablet Take 2 mg by mouth every morning.   . ESBRIET 267 MG TABS TAKE 3 TABLETS BY MOUTH THREE TIMES A DAY WITH MEALS  . isosorbide mononitrate (IMDUR) 30 MG 24 hr tablet TAKE 1 TABLET EVERY DAY  . magnesium gluconate (MAGONATE) 500 MG tablet Take 500 mg by mouth daily.  . metoprolol tartrate (LOPRESSOR) 50 MG tablet TAKE 1/2  TABLET 2 (TWO) TIMES DAILY. PLEASE KEEP UPCOMING APPT IN NOVEMBER FOR FUTURE REFILLS. THANK YOU.  . nitroGLYCERIN (NITROSTAT) 0.4 MG SL tablet Place 1 tablet (0.4 mg total) under the tongue every 5 (five) minutes as needed for chest pain. (Patient taking differently: Place 0.4 mg under the tongue every 5 (five) minutes as needed for chest pain. Has not needed for years)  . pantoprazole (PROTONIX) 40 MG tablet Take 40 mg by mouth daily.   No facility-administered encounter medications on file as of 06/12/2019.     PHYSICAL EXAM:   PE deferred d/t telehealth/audo only nature of visit  Anselm Lis, NP

## 2019-06-16 DIAGNOSIS — H6123 Impacted cerumen, bilateral: Secondary | ICD-10-CM | POA: Diagnosis not present

## 2019-06-29 ENCOUNTER — Telehealth: Payer: Self-pay | Admitting: Pulmonary Disease

## 2019-06-29 NOTE — Telephone Encounter (Signed)
Dr. Elsworth Soho, this patient was last seen 04/30/19 for IPF. Based on your notes from that visit. Please advise if you feel the patient would benefit from the vaccine in question? Thank you much.

## 2019-06-30 DIAGNOSIS — G4700 Insomnia, unspecified: Secondary | ICD-10-CM | POA: Diagnosis not present

## 2019-06-30 NOTE — Telephone Encounter (Signed)
He should take the Covid vaccine when it becomes available to him

## 2019-06-30 NOTE — Telephone Encounter (Signed)
Called and spoke with pt letting him know that AO said he should receive the vaccine once it is avail. Pt verbalized understanding. Nothing further needed.

## 2019-07-01 ENCOUNTER — Telehealth: Payer: Self-pay | Admitting: Pulmonary Disease

## 2019-07-01 NOTE — Telephone Encounter (Signed)
Spoke with pt, he is having a really bad problem with phlegm behind his nose and interrupts his sleep. He has taken Sudafed and it helps but his daughter wanted him to check with [place] first before continuing to take it. If the Sudafed is not a good choice, he would like suggestions on what to take. He states the phlegm isn't in his chest, he is really congested in his nose. Please advise if it is ok to take Sudafed.

## 2019-07-01 NOTE — Telephone Encounter (Signed)
Recommend mucinex 1,200mg  twice daily (we can send in prescription) and ocean nasal spray followed by flonase nasal spray once daily

## 2019-07-02 MED ORDER — SALINE SPRAY 0.65 % NA SOLN: 1.0000 | Freq: Every day | NASAL | 3 refills | Status: DC

## 2019-07-02 MED ORDER — FLUTICASONE PROPIONATE 50 MCG/ACT NA SUSP: 1.0000 | Freq: Every day | NASAL | 2 refills | Status: DC

## 2019-07-02 MED ORDER — GUAIFENESIN ER 1200 MG PO TB12: 1200.0000 mg | ORAL_TABLET | Freq: Two times a day (BID) | ORAL | 3 refills | Status: AC

## 2019-07-02 NOTE — Telephone Encounter (Signed)
Called and spoke with pt's wife in regards to the info stated by [facility]. After relaying the meds, she wanted [place] to send prescriptions to pharmacy. I verified pt's preferred pharmacy and sent meds to pharmacy for pt. Nothing further needed.

## 2019-07-15 ENCOUNTER — Other Ambulatory Visit: Payer: Self-pay

## 2019-07-15 MED ORDER — METOPROLOL TARTRATE 50 MG PO TABS: ORAL_TABLET | ORAL | 1 refills | Status: DC

## 2019-07-22 MED ORDER — METOPROLOL TARTRATE 50 MG PO TABS: ORAL_TABLET | ORAL | 1 refills | Status: DC

## 2019-07-22 NOTE — Addendum Note (Signed)
Addended by: Demetrios Loll on: 07/22/2019 09:22 AM   Modules accepted: Orders

## 2019-07-30 ENCOUNTER — Other Ambulatory Visit: Payer: Self-pay | Admitting: Cardiovascular Disease

## 2019-07-31 ENCOUNTER — Ambulatory Visit (INDEPENDENT_AMBULATORY_CARE_PROVIDER_SITE_OTHER): Payer: Medicare HMO | Admitting: Primary Care

## 2019-07-31 ENCOUNTER — Other Ambulatory Visit: Payer: Self-pay

## 2019-07-31 ENCOUNTER — Encounter: Payer: Self-pay | Admitting: Primary Care

## 2019-07-31 DIAGNOSIS — J84112 Idiopathic pulmonary fibrosis: Secondary | ICD-10-CM

## 2019-07-31 MED ORDER — GABAPENTIN 300 MG PO CAPS: 300.0000 mg | ORAL_CAPSULE | Freq: Every day | ORAL | 0 refills | Status: DC

## 2019-07-31 MED ORDER — ESBRIET 801 MG PO TABS: 30.0000 | ORAL_TABLET | Freq: Every day | ORAL | 3 refills | Status: AC

## 2019-07-31 NOTE — Progress Notes (Signed)
Virtual Visit via Telephone Note  I connected with Patrick Knapp on 07/31/19 at  9:30 AM EST by telephone and verified that I am speaking with the correct person using two identifiers.  Location: Patient: Home Provider: Office    I discussed the limitations, risks, security and privacy concerns of performing an evaluation and management service by telephone and the availability of in person appointments. I also discussed with the patient that there may be a patient responsible charge related to this service. The patient expressed understanding and agreed to proceed.   History of Present Illness: 86 year old male, never smoked.  Past medical history significant for IPF, hypertension, GERD.  Patient of Dr. Vassie Loll, last seen April 30, 2019. He was started on OFEV in February 2019 for decrease in FVC. Dose decreased to 100mg  BID d/t diarrhea symptoms. Maintained Esbriet 801mg  daily, started 01/21/19. Tolerating well with no side effects.   07/31/2019 Patient contacted today for 3 month follow-up/telelvisit. He continues Esbriet 801mg  mg three times a day. No side effects. He has been walking on the treadmill every day. O2 91-97% RA. States that he would like to change to 90 day supply of Esbriet. He has been taking his wifes gabapentin 400mg  at bedtime to help him sleep. His breathing has been ok, wife think he has improved. Only difficulty he has is bending over and picking up something heavy. He is scheduled for first COVID vaccine on Monday.   Observations/Objective:  - Able to speak in full sentence  - No shortness of breath, wheezing or cough  Imaging: HRCT 08/2018 "probable" UIP, no honeycombing, unchanged from 2018  CT chest 2011 showed diffuse right greater than left peripheral and subpleural reticular fibrosis. Ambulatory O2 saturation on room air without desaturation 12/28/2016 ANA , CCP neg  HRCT6/2067fibrotic interstitial lung disease with mild honeycombing. Slight  basilar predominance and slight asymmetrical involvement of the right lung with some progression since 2011.  PFTs 01/2017 no obs, FEV1 96%, FVC 76% DLCO was noted 57% in 04/2017.  Spirometry12/2018slight drop in FVC 68% 11/2017 FVC 65% 05/2018 FVC 49 %  Assessment and Plan:  ILD: - Stable interval - Breathing at baseline with no acute respiratory symptoms. Remains active  - Continue Esbriet 801mg  once daily (sent new prescription to [facility]) - Needs LFTs in 1-2 weeks  - FU in 3 months with PFTs  - He is scheduled for first COVID vaccine on Monday 08/03/19  Insomnia: - Reports difficulty falling asleep, he has been taking his wife's Neurontin at bedtime with improvement in sleep  - Rx Gabapentin 300mg  [facility]   Follow Up Instructions:  - FU in 3-4 months with Dr. 06/2018   I discussed the assessment and treatment plan with the patient. The patient was provided an opportunity to ask questions and all were answered. The patient agreed with the plan and demonstrated an understanding of the instructions.   The patient was advised to call back or seek an in-person evaluation if the symptoms worsen or if the condition fails to improve as anticipated.  I provided 18 minutes of non-face-to-face time during this encounter.   , NP

## 2019-07-31 NOTE — Patient Instructions (Addendum)
Pleasure speaking with you today Patrick Knapp Glad that you are doing well Congratulations on getting your COVID vaccine on Monday 1/25  Recommendations: Sending in new prescription for Esbriet 801mg  tablet (take one daily) Please come in for labs in the next week or two   Orders: LFTs in the next week PFTs/DLCO in 3 months   Follow-up: 3-4 months with Dr. with PFTs

## 2019-08-04 ENCOUNTER — Telehealth: Payer: Self-pay | Admitting: Primary Care

## 2019-08-04 MED ORDER — ESBRIET 801 MG PO TABS: 1.0000 | ORAL_TABLET | Freq: Every day | ORAL | 1 refills | Status: DC

## 2019-08-04 MED ORDER — ESBRIET 801 MG PO TABS: 1.0000 | ORAL_TABLET | Freq: Three times a day (TID) | ORAL | 5 refills | Status: DC

## 2019-08-04 NOTE — Telephone Encounter (Signed)
He should be taking 801mg  three times a day

## 2019-08-04 NOTE — Telephone Encounter (Signed)
Spoke with pharm and gave VO for Esbriet:   Sending in new prescription for Esbriet 801mg  tablet (take one daily)--Per last AVS  Pharmacy is asking that we verify this with Beth, as the pt had been taking the 267 mg 2 tablets tid   Please advise thanks

## 2019-08-04 NOTE — Telephone Encounter (Signed)
I fixed the instructions on pt's Esbriet Rx with it showing for pt to take 1 tablet tid.  [facility] and spoke with pharmacist Harriett Sine and was able to get the instructions changed for pt's Esbriet. Nothing further needed.

## 2019-08-05 ENCOUNTER — Ambulatory Visit: Payer: Medicare HMO

## 2019-08-15 ENCOUNTER — Ambulatory Visit: Payer: Medicare HMO | Attending: Internal Medicine

## 2019-08-15 DIAGNOSIS — Z23 Encounter for immunization: Secondary | ICD-10-CM | POA: Insufficient documentation

## 2019-08-19 ENCOUNTER — Telehealth: Payer: Self-pay | Admitting: Primary Care

## 2019-08-19 ENCOUNTER — Other Ambulatory Visit: Payer: Self-pay | Admitting: Cardiovascular Disease

## 2019-08-19 MED ORDER — GABAPENTIN 400 MG PO CAPS: 400.0000 mg | ORAL_CAPSULE | Freq: Every day | ORAL | 3 refills | Status: DC

## 2019-08-19 NOTE — Telephone Encounter (Signed)
Instructions from OV 07/30/19 Insomnia: - Reports difficulty falling asleep, he has been taking his wife's Neurontin at bedtime with improvement in sleep  - Rx Gabapentin 300mg  QHS    Return in about 3 months (around 10/29/2019). Pleasure speaking with you today Patrick Knapp that you are doing well Congratulations on getting your COVID vaccine on Monday 1/25  Recommendations: Sending in new prescription for Esbriet 801mg  tablet (take one daily) Please come in for labs in the next week or two   Orders: LFTs in the next week PFTs/DLCO in 3 months   Follow-up: 3-4 months with Dr. 2/25 with PFTs     Called and spoke with pt who stated he was needing to have 400mg  gabapentin sent to [facility] for him. Pt stated at last OV with [facility] 1/22 Rx for 300 #15 was sent to pharmacy for pt but pt stated he was told by [facility] if he needed to have the 400mg  to let her know. Pt stated previously when taking 400mg , he does not wake up feeling like a zombie and also states that the 300mg  did not really help so that is why he needs to have the 400mg .  Beth, please advise if you are okay with Patrick Knapp sending a 90day supply of gabapentin 400mg  to [facility] mail order for pt.

## 2019-08-19 NOTE — Telephone Encounter (Signed)
neurontin 300mg  Rx has been discontinued and neurontin 400mg  Rx has been sent to pharmacy.   Called and spoke with pt letting him know this had been done and pt verbalized understanding. Nothing further needed.

## 2019-08-19 NOTE — Telephone Encounter (Signed)
Can you please discontinue Neurontin 300mg  and send in new prescription for Neurontin 400mg  at bedtime. #30 tabs with 3 refills

## 2019-08-19 NOTE — Telephone Encounter (Signed)
Pt calling to get 400 mg Gabapentin called into [facility]. Pt can be reached at [phone]

## 2019-09-03 ENCOUNTER — Other Ambulatory Visit (INDEPENDENT_AMBULATORY_CARE_PROVIDER_SITE_OTHER): Payer: Medicare HMO

## 2019-09-03 DIAGNOSIS — J84112 Idiopathic pulmonary fibrosis: Secondary | ICD-10-CM

## 2019-09-03 LAB — HEPATIC FUNCTION PANEL
ALT: 14 U/L (ref 0–53)
AST: 15 U/L (ref 0–37)
Albumin: 3.6 g/dL (ref 3.5–5.2)
Alkaline Phosphatase: 79 U/L (ref 39–117)
Bilirubin, Direct: 0.1 mg/dL (ref 0.0–0.3)
Total Bilirubin: 0.5 mg/dL (ref 0.2–1.2)
Total Protein: 7.3 g/dL (ref 6.0–8.3)

## 2019-09-04 DIAGNOSIS — B351 Tinea unguium: Secondary | ICD-10-CM | POA: Diagnosis not present

## 2019-09-09 ENCOUNTER — Ambulatory Visit: Payer: Medicare HMO | Attending: Internal Medicine

## 2019-09-09 DIAGNOSIS — Z23 Encounter for immunization: Secondary | ICD-10-CM

## 2019-09-09 NOTE — Progress Notes (Signed)
   Covid-19 Vaccination Clinic  Name:  Patrick Knapp    MRN: [id number] DOB: May 11, 1928  09/09/2019  [name] was observed post Covid-19 immunization for 15 minutes without incident. He was provided with Vaccine Information Sheet and instruction to access the V-Safe system.   [name] was instructed to call 911 with any severe reactions post vaccine: Marland Kitchen Difficulty breathing  . Swelling of face and throat  . A fast heartbeat  . A bad rash all over body  . Dizziness and weakness   Immunizations Administered    Name Date Dose VIS Date Route   Pfizer COVID-19 Vaccine 09/09/2019 12:28 PM 0.3 mL 06/19/2019 Intramuscular   Manufacturer: [employer], [employer]   Lot: [id number]   NDC: [address]-4

## 2019-11-25 ENCOUNTER — Other Ambulatory Visit: Payer: Self-pay | Admitting: Cardiovascular Disease

## 2019-11-27 ENCOUNTER — Telehealth: Payer: Self-pay

## 2019-11-27 NOTE — Telephone Encounter (Signed)
Phone call placed to patient's wife,Pat, to follow up after volunteer call. Dennie Bible shared that patient is doing ok right now and would like to schedule a telehealth visit with [facility] NP. Scheduled for 12/11/19 @ 4:15pm

## 2019-12-08 ENCOUNTER — Telehealth: Payer: Self-pay | Admitting: Primary Care

## 2019-12-08 NOTE — Telephone Encounter (Signed)
Patient called regarding triage message and test results. Patient unable to identify the type of result he wants to discuss. Plan to discuss at next office visit.

## 2019-12-10 NOTE — Progress Notes (Signed)
June 4th, 2021 [facility] Palliative Care Consult Note Telephone: [phone]  Fax: [phone]   Due to the current COVID-19 infection/crises, the family prefer, and have given their verbal consent for, a provider visit via telemedicine. HIPPA policies of confidentially were discussed. Video-audio (telehealth) contact was unable to be done due technical barriers from the patient's side.   PATIENT NAME: Patrick Knapp DOB: 02-16-1928 MRN: [id number] [address] [place], [place]  [address] Pt:  [phone]   PRIMARY CARE PROVIDER:   Chilton Greathouse, MD Cardiology: Dr. Lina Sayre Pulmonary: Dr. Cyril Mourning / Ames Dura NP   REFERRING PROVIDER:  Chilton Greathouse, MD [address] [place],  [place] [address]   RESPONSIBLE PARTY:   Elease Hashimoto (wife) [phone] *Call this number for Consult* (dtr) [name] (is a nurse) [place] [phone].   ASSESSMENT / RECOMMENDATIONS:  1. Advance Care Planning:  A. Directives:    -DNR    -MOST DNR/DNI, Scope of Medical Interventions: Limited. Yes to IVFs, Yes to antibiotics, no to Tube Feedings.   B. Goals of Care: To continue to increase his stamina and energy to be able to enjoy his hobbies.   2. Cognitive / Functional status: Patient continues A & O x 3. He continues independent in his ADLs. He continues to drive. Walks on his home treadmill about 300-400 steps at a time, 3-4 times a day.He also walks in [place] when the weather is goo. Frequent rest breaks but recovers from minimal dyapena within less than a min then can walk on. His weight is stable in the low 180's. At a height ot 5'8" his BMI is 29kg/m2. His appetite is a little diminished on his new respiratory medications. He and his spouse have both had their COVID shots.   Mildly SOB with conversation. Doesn't' use home oxygen; his Oxygen sats were in good range when checked in doctor's office. He has a home pulse oximeter; readings run about  94%.  He sleeps better at night since starting gabapentin qhs.    3. Family Supports: spouse Dennie Bible married 51yr. no children together; [name] had 4 and Pat had 2 from previous marriages. [name]'s daughter is a Engineer, civil (consulting) [name]) in [place] [address] [phone]. Comes by qwk or so to check in. [name] was a Investment banker, operational; owned an [employer], then worked at [employer] for 20 yrs.    4. Follow up Palliative Care Visit: Fri 01/15/2020 @3pm    I spent 30 minutes providing this consultation from4pm to 4:30pm. More than 50% of the time in this consultation was spent coordinating communication.    HISTORY OF PRESENT ILLNESS:  Patrick Knapp 86 year old male, never smoked. PMH significant for HTN, CAD (nl echo/stress test 2020, stent 2008), IPF, GERD, OA, hyperlipemia. This is a f/u Palliative Care visit from Dec 4th, 2020.     CODE STATUS: DNR   PPS: 70%   HOSPICE ELIGIBILITY/DIAGNOSIS: No; prognosis thought greater than 36months.  PAST MEDICAL HISTORY:  Past Medical History:  Diagnosis Date  . Arthritis    "all the joints" (02/06/2016)  . Coronary artery disease    a. PCI with DES to mid LAD 7/17 b.post PTCA and stenting of his left circumflex artery in 12/08  . Enlarged prostate   . GERD (gastroesophageal reflux disease)   . Hyperlipidemia   . Hypertension   . Ischemia   . Pulmonary fibrosis, unspecified (HCC)   . Shortness of breath   . Skin disorder    Occurs at  times as rash, whelps, blister-like areas  with itching sometimes-has occurred for years , but can not find an exact dx or cause    SOCIAL HX:  Social History   Tobacco Use  . Smoking status: Never Smoker  . Smokeless tobacco: Never Used  Substance Use Topics  . Alcohol use: Yes    Comment: 02/06/2016 "might have a glass of beer or wine once/month"    ALLERGIES:  Allergies  Allergen Reactions  . Chlorhexidine Gluconate Itching  . Iodinated Diagnostic Agents     Not really sure of the incident// patient denies allergy and has had  catherterization without incident.     PERTINENT MEDICATIONS:  Outpatient Encounter Medications as of 12/11/2019  Medication Sig  . acetaminophen (TYLENOL) 650 MG CR tablet Take 650 mg by mouth every 8 (eight) hours as needed for pain.  Marland Kitchen ALPRAZolam (XANAX) 0.5 MG tablet Take 0.5 mg by mouth 3 (three) times daily as needed. AS NEEDED FOR ANXIETY; taking at bedtime only for insomnia  . amLODipine (NORVASC) 2.5 MG tablet TAKE 1 TABLET EVERY DAY  . atorvastatin (LIPITOR) 20 MG tablet Take 20 mg by mouth daily.   . benzonatate (TESSALON PERLES) 100 MG capsule Take 2 capsules (200 mg total) by mouth every 8 (eight) hours as needed for cough.  . cholecalciferol (VITAMIN D) 1000 units tablet Take 1,000 Units by mouth daily.  . clopidogrel (PLAVIX) 75 MG tablet Take 1 tablet (75 mg total) by mouth daily.  Marland Kitchen doxazosin (CARDURA) 4 MG tablet Take 2 mg by mouth every morning.   . fluticasone (FLONASE) 50 MCG/ACT nasal spray Place 1 spray into both nostrils daily.  Marland Kitchen gabapentin (NEURONTIN) 400 MG capsule Take 1 capsule (400 mg total) by mouth at bedtime.  . Guaifenesin 1200 MG TB12 Take 1 tablet (1,200 mg total) by mouth 2 (two) times daily.  . isosorbide mononitrate (IMDUR) 30 MG 24 hr tablet Take 1 tablet (30 mg total) by mouth daily. Please make yearly appt with Dr. Acie Fredrickson for June before anymore refills. 1st attempt  . magnesium gluconate (MAGONATE) 500 MG tablet Take 500 mg by mouth daily.  . metoprolol tartrate (LOPRESSOR) 50 MG tablet TAKE 1/2  TABLET 2 (TWO) TIMES DAILY. Please make yearly appt with Dr. Acie Fredrickson for June for future refills. 1st attempt  . nitroGLYCERIN (NITROSTAT) 0.4 MG SL tablet Place 1 tablet (0.4 mg total) under the tongue every 5 (five) minutes as needed for chest pain. (Patient taking differently: Place 0.4 mg under the tongue every 5 (five) minutes as needed for chest pain. Has not needed for years)  . pantoprazole (PROTONIX) 40 MG tablet Take 40 mg by mouth daily.  . Pirfenidone  (ESBRIET) 801 MG TABS Take 1 tablet by mouth 3 (three) times daily.  . sodium chloride (OCEAN) 0.65 % SOLN nasal spray Place 1 spray into both nostrils daily.   No facility-administered encounter medications on file as of 12/11/2019.    PHYSICAL EXAM:   deferred d/t telehealth/audo only nature of visit  Julianne Handler, NP

## 2019-12-11 ENCOUNTER — Other Ambulatory Visit: Payer: Medicare HMO | Admitting: Internal Medicine

## 2019-12-11 ENCOUNTER — Other Ambulatory Visit: Payer: Self-pay

## 2019-12-11 DIAGNOSIS — Z515 Encounter for palliative care: Secondary | ICD-10-CM

## 2019-12-11 DIAGNOSIS — Z7189 Other specified counseling: Secondary | ICD-10-CM

## 2019-12-13 ENCOUNTER — Encounter: Payer: Self-pay | Admitting: Internal Medicine

## 2019-12-23 ENCOUNTER — Other Ambulatory Visit: Payer: Self-pay

## 2019-12-23 ENCOUNTER — Encounter: Payer: Self-pay | Admitting: Pulmonary Disease

## 2019-12-23 ENCOUNTER — Ambulatory Visit: Payer: Medicare HMO | Admitting: Pulmonary Disease

## 2019-12-23 ENCOUNTER — Ambulatory Visit (INDEPENDENT_AMBULATORY_CARE_PROVIDER_SITE_OTHER): Payer: Medicare HMO

## 2019-12-23 VITALS — BP 138/76 | HR 68 | Temp 98.4°F | Ht 68.0 in | Wt 183.4 lb

## 2019-12-23 DIAGNOSIS — J84112 Idiopathic pulmonary fibrosis: Secondary | ICD-10-CM | POA: Diagnosis not present

## 2019-12-23 DIAGNOSIS — I10 Essential (primary) hypertension: Secondary | ICD-10-CM | POA: Diagnosis not present

## 2019-12-23 DIAGNOSIS — J8410 Pulmonary fibrosis, unspecified: Secondary | ICD-10-CM | POA: Diagnosis not present

## 2019-12-23 LAB — HEPATIC FUNCTION PANEL
ALT: 13 U/L (ref 0–53)
AST: 16 U/L (ref 0–37)
Albumin: 4 g/dL (ref 3.5–5.2)
Alkaline Phosphatase: 82 U/L (ref 39–117)
Bilirubin, Direct: 0.1 mg/dL (ref 0.0–0.3)
Total Bilirubin: 0.5 mg/dL (ref 0.2–1.2)
Total Protein: 7.7 g/dL (ref 6.0–8.3)

## 2019-12-23 NOTE — Progress Notes (Signed)
   Subjective:    Patient ID: Patrick Knapp, male    DOB: July 07, 1928, 86 y.o.   MRN: [id number]  HPI  86 yo never smokerFor follow-up ofIdiopathicpulmonary fibrosis.  He was started on OFEV2/2019 forslight drop in lung function/FVCbut this was stopped due to diarrhea in spite of decreasing the dose.  He was started on Esbriet 01/2019 and is tolerating well. Denies dyspnea or cough. He has received Covid vaccines and wonders if he can go back to the casino with his son who plays poker  Is taking proper sunscreen precautions and tolerating Esbriet much better from a GI standpoint. Did not desaturate on ambulation  Chest x-ray obtained today shows extensive fibrotic changes bilaterally  Significant tests/ events reviewed  HRCT 08/2018 "probable" UIP, no honeycombing, unchanged from 2018  CT chest 2011 showed diffuse right greater than left peripheral and subpleural reticular fibrosis. Ambulatory O2 saturation on room air without desaturation 12/28/2016 ANA , CCP neg  HRCT6/2069fibrotic interstitial lung disease with mild honeycombing. Slight basilar predominance and slight asymmetrical involvement of the right lung with some progression since 2011.  PFTs 01/2017 no obs, FEV1 96%, FVC 76% DLCO was noted 57% in 04/2017.  Spirometry12/2018slight drop in FVC 68% 11/2017 FVC 65% 05/2018 FVC 49 %  Review of Systems Patient denies significant dyspnea,cough, hemoptysis,  chest pain, palpitations, pedal edema, orthopnea, paroxysmal nocturnal dyspnea, lightheadedness, nausea, vomiting, abdominal or  leg pains      Objective:   Physical Exam  Gen. Pleasant, elderly,well-nourished, in no distress ENT - no thrush, no pallor/icterus,no post nasal drip Neck: No JVD, no thyromegaly, no carotid bruits Lungs: no use of accessory muscles, no dullness to percussion, bibasal rales no rhonchi  Cardiovascular: Rhythm regular, heart sounds  normal, no murmurs or gallops, no  peripheral edema Musculoskeletal: No deformities, no cyanosis or clubbing        Assessment & Plan:

## 2019-12-23 NOTE — Assessment & Plan Note (Signed)
Appears stable clinically. Tolerating Esbriet much better. We will arrange for spirometry next visit. Chest x-ray appears stable. We will obtain LFTs today

## 2019-12-23 NOTE — Patient Instructions (Signed)
LFTs today Ambulatory saturation  Spirometry next visit in 3 months

## 2019-12-24 ENCOUNTER — Telehealth: Payer: Self-pay | Admitting: Cardiovascular Disease

## 2019-12-24 NOTE — Telephone Encounter (Signed)
Patient is requesting to speak with Dr. Harvie Bridge nurse in regards to recent twitching due to stent. He states he has been experiencing twitching on the left side of his bottom rib, however he has not experienced any pain or additional symptoms. Please call to discuss.

## 2019-12-24 NOTE — Telephone Encounter (Signed)
Returned call to patient who called to ask if a twitching that is occurring intermittently in area at bottom of left rib cage. States it is not painful, just an annoyance. It does not wake him up or cause additional symptoms such as SOB, CP. He asks if it could be coming from his stent in his heart closing up. His last cardiac cath with PCI was 7/17. I advised that twitching in that area would not likely indicate anything related to his cardiac stents. I advised that the only way to know if a stent is still patent is by cardiac cath. States he is very relieved to hear this information.  He is overdue for his appointment with Dr. Elease Hashimoto so I scheduled him for 8/2. He thanked me for the call.

## 2020-01-15 ENCOUNTER — Other Ambulatory Visit: Payer: Self-pay

## 2020-01-15 ENCOUNTER — Other Ambulatory Visit: Payer: Medicare HMO | Admitting: Internal Medicine

## 2020-01-22 ENCOUNTER — Other Ambulatory Visit: Payer: Self-pay | Admitting: Cardiovascular Disease

## 2020-01-27 ENCOUNTER — Other Ambulatory Visit: Payer: Self-pay | Admitting: Primary Care

## 2020-01-27 DIAGNOSIS — J84112 Idiopathic pulmonary fibrosis: Secondary | ICD-10-CM

## 2020-02-08 ENCOUNTER — Ambulatory Visit: Payer: Medicare HMO | Admitting: Cardiovascular Disease

## 2020-02-08 ENCOUNTER — Encounter: Payer: Self-pay | Admitting: Cardiovascular Disease

## 2020-02-08 ENCOUNTER — Other Ambulatory Visit: Payer: Self-pay

## 2020-02-08 VITALS — BP 92/48 | HR 44 | Ht 68.0 in | Wt 185.0 lb

## 2020-02-08 DIAGNOSIS — E7849 Other hyperlipidemia: Secondary | ICD-10-CM | POA: Diagnosis not present

## 2020-02-08 DIAGNOSIS — J84112 Idiopathic pulmonary fibrosis: Secondary | ICD-10-CM

## 2020-02-08 DIAGNOSIS — I25119 Atherosclerotic heart disease of native coronary artery with unspecified angina pectoris: Secondary | ICD-10-CM | POA: Diagnosis not present

## 2020-02-08 DIAGNOSIS — I2510 Atherosclerotic heart disease of native coronary artery without angina pectoris: Secondary | ICD-10-CM

## 2020-02-08 LAB — LIPID PANEL
Chol/HDL Ratio: 2.6 ratio (ref 0.0–5.0)
Cholesterol, Total: 109 mg/dL (ref 100–199)
HDL: 42 mg/dL (ref >39)
LDL Chol Calc (NIH): 48 mg/dL (ref 0–99)
Triglycerides: 102 mg/dL (ref 0–149)
VLDL Cholesterol Cal: 19 mg/dL (ref 5–40)

## 2020-02-08 LAB — BASIC METABOLIC PANEL
BUN/Creatinine Ratio: 13 (ref 10–24)
BUN: 18 mg/dL (ref 8–27)
CO2: 22 mmol/L (ref 20–29)
Calcium: 9.1 mg/dL (ref 8.6–10.2)
Chloride: 106 mmol/L (ref 96–106)
Creatinine, Ser: 1.4 mg/dL — ABNORMAL HIGH (ref 0.76–1.27)
GFR calc Af Amer: 51 mL/min/{1.73_m2} — ABNORMAL LOW (ref >59)
GFR calc non Af Amer: 44 mL/min/{1.73_m2} — ABNORMAL LOW (ref >59)
Glucose: 109 mg/dL — ABNORMAL HIGH (ref 65–99)
Potassium: 4.4 mmol/L (ref 3.5–5.2)
Sodium: 140 mmol/L (ref 134–144)

## 2020-02-08 LAB — HEPATIC FUNCTION PANEL
ALT: 14 IU/L (ref 0–44)
AST: 20 IU/L (ref 0–40)
Albumin: 3.7 g/dL (ref 3.6–4.6)
Alkaline Phosphatase: 93 IU/L (ref 48–121)
Bilirubin Total: 0.5 mg/dL (ref 0.0–1.2)
Bilirubin, Direct: 0.15 mg/dL (ref 0.00–0.40)
Total Protein: 7.2 g/dL (ref 6.0–8.5)

## 2020-02-08 NOTE — Progress Notes (Signed)
Patrick Knapp Date of Birth  1927-07-22 [facility] [address]    [address] [place], [place]  [address] [phone]  Fax  [phone]   Problem list: 1. Coronary artery disease 2. Hypertension 3. Hyperlipidemia 4. BPH   Patrick Knapp is an 86 y.o.  with a hx of CAD, HTN, hyperlipidemia, and BPH.  He is not had any episodes of chest pain or shortness breath. He's exploring some alternate therapies for his BPH including cryotherapy.  He works at [employer] for 4 hours every day. He does not get any other regular exercise.  Sept. 9, 2015:  Patrick Knapp has had some DOE walking [place] the airport terminal .   No CP -   Similar to how he felt prior to stenting  He has a right knee injury - needs knee replacement on Nov. 23. ( [place])  Has not been able to exercise.    January 23, 2016:  Patrick Knapp is seen today after a 2 year absence.   Seen with his daughter, Patrick Knapp  He complains of lots of fatigue. His last Myoview study was 03/25/2016. He had no evidence of ischemia at that time and had normal left ventricle systolic function.  Has severe exhaustion with any exertion .   Has lots of DOE   thinks that he feels similar to when he had his stent placed . He has never had CP - even prior to stenting  His DOE seems to be worsening   DOE even walking to his mail box   Jun 04, 2016:  Patrick Knapp had PCI ( [employer] [facility])   of his LAD in July . Doing much better  Still having DOE and fatigue walking up hills  Had talked to [facility] about starting Imdur if needed.   Nov 27, 2016:  Doing well.  Has some exertional leg fatigue.  No CP or dyspnea.   Nov. 20, 2019  No CP  Has some DOE with walking up a hill.  Has IPF  February 08, 2020: Has a history of coronary artery disease.  He stenting of his LAD in July, 2017.  He also has idiopathic pulmonary fibrosis.    Current Outpatient Medications on File Prior to Visit  Medication Sig Dispense Refill  . acetaminophen (TYLENOL) 650 MG  CR tablet Take 650 mg by mouth every 8 (eight) hours as needed for pain.    Marland Kitchen ALPRAZolam (XANAX) 0.5 MG tablet Take 0.5 mg by mouth 3 (three) times daily as needed. AS NEEDED FOR ANXIETY; taking at bedtime only for insomnia    . amLODipine (NORVASC) 2.5 MG tablet TAKE 1 TABLET EVERY DAY 90 tablet 2  . atorvastatin (LIPITOR) 20 MG tablet Take 20 mg by mouth daily.     . benzonatate (TESSALON PERLES) 100 MG capsule Take 2 capsules (200 mg total) by mouth every 8 (eight) hours as needed for cough. 90 capsule 3  . cholecalciferol (VITAMIN D) 1000 units tablet Take 1,000 Units by mouth daily.    . clopidogrel (PLAVIX) 75 MG tablet Take 1 tablet (75 mg total) by mouth daily. 30 tablet 11  . doxazosin (CARDURA) 4 MG tablet Take 2 mg by mouth every morning.     . fluticasone (FLONASE) 50 MCG/ACT nasal spray Place 1 spray into both nostrils daily. 16 g 2  . gabapentin (NEURONTIN) 300 MG capsule Take 300 mg by mouth daily.    . Guaifenesin 1200 MG TB12 Take 1 tablet (1,200 mg total) by  mouth 2 (two) times daily. 60 tablet 3  . isosorbide mononitrate (IMDUR) 30 MG 24 hr tablet Take 1 tablet (30 mg total) by mouth daily. Please keep upcoming appt in August with Dr. Elease Hashimoto before anymore refills. Thank you 90 tablet 0  . magnesium gluconate (MAGONATE) 500 MG tablet Take 500 mg by mouth daily.    . metoprolol tartrate (LOPRESSOR) 50 MG tablet TAKE 1/2  TABLET 2 (TWO) TIMES DAILY. Please make yearly appt with Dr. Elease Hashimoto for June for future refills. 1st attempt 90 tablet 1  . nitroGLYCERIN (NITROSTAT) 0.4 MG SL tablet Place 1 tablet (0.4 mg total) under the tongue every 5 (five) minutes as needed for chest pain. 25 tablet 3  . pantoprazole (PROTONIX) 40 MG tablet Take 40 mg by mouth daily.    . sodium chloride (OCEAN) 0.65 % SOLN nasal spray Place 1 spray into both nostrils daily. 15 mL 3   No current facility-administered medications on file prior to visit.    Allergies  Allergen Reactions  . Chlorhexidine  Gluconate Itching  . Iodinated Diagnostic Agents     Not really sure of the incident// patient denies allergy and has had catherterization without incident.    Past Medical History:  Diagnosis Date  . Arthritis    "all the joints" (02/06/2016)  . Coronary artery disease    a. PCI with DES to mid LAD 7/17 b.post PTCA and stenting of his left circumflex artery in 12/08  . Enlarged prostate   . GERD (gastroesophageal reflux disease)   . Hyperlipidemia   . Hypertension   . Ischemia   . Pulmonary fibrosis, unspecified (HCC)   . Shortness of breath   . Skin disorder    Occurs at times as rash, whelps, blister-like areas  with itching sometimes-has occurred for years , but can not find an exact dx or cause    Past Surgical History:  Procedure Laterality Date  . CARDIAC CATHETERIZATION N/A 02/06/2016   Procedure: Right/Left Heart Cath and Coronary Angiography;  Surgeon: Tonny Bollman, MD;  Location: [facility] INVASIVE CV LAB;  Service: Cardiovascular;  Laterality: N/A;  . CATARACT EXTRACTION, BILATERAL Bilateral 2016  . CORONARY ANGIOPLASTY WITH STENT PLACEMENT  06/2007   Est. EF of 55-60%PTCA and stenting of his left circumflex artery in 12/08  . EYE SURGERY    . INGUINAL HERNIA REPAIR Left   . JOINT REPLACEMENT    . KNEE ARTHROSCOPY  1990s  . ORBITAL FRACTURE SURGERY Left 1957  . TONSILLECTOMY    . TOTAL KNEE ARTHROPLASTY Right 05/31/2014   Procedure: RIGHT TOTAL KNEE ARTHROPLASTY;  Surgeon: Loanne Drilling, MD;  Location: [facility] ORS;  Service: Orthopedics;  Laterality: Right;    Social History   Tobacco Use  Smoking Status Never Smoker  Smokeless Tobacco Never Used    Social History   Substance and Sexual Activity  Alcohol Use Yes   Comment: 02/06/2016 "might have a glass of beer or wine once/month"    Family History  Problem Relation Age of Onset  . Heart failure Mother        possible  . Heart disease Mother   . Heart disease Father   . Coronary artery disease Brother   .  Heart disease Brother     Reviw of Systems:  Reviewed in the HPI.  All other systems are negative.   Physical Exam: Blood pressure (!) 92/48, pulse (!) 44, height 5\' 8"  (1.727 m), weight 185 lb (83.9 kg), SpO2 95 %.  GEN:  Elderly male,  NAD  HEENT: Normal NECK: No JVD; No carotid bruits LYMPHATICS: No lymphadenopathy CARDIAC: RRR , no murmurs, rubs, gallops RESPIRATORY:  Fine rales in bases bilaterally,  L > R  ABDOMEN: Soft, non-tender, non-distended MUSCULOSKELETAL:  No edema; No deformity  SKIN: Warm and dry NEUROLOGIC:  Alert and oriented x 3      ECG: February 08, 2020: Sinus bradycardia 45 beats a minute.  No ST or T wave changes.  Assessment / Plan:    1. Coronary artery disease: He denies any episodes of angina.  His last labs were 1 year ago.  We will recheck his lipids.  Fortunately, is not having episodes of chest pain. Marland Kitchen  He denies having any angina.  We will stop the isosorbide because his blood pressure is low.  We will also be stopping the metoprolol because his heart rate is slow.  2. Hypertension -heart rate and blood pressure are both low today.  We will stop the metoprolol and also stop isosorbide.  He will call us back if the has any episodes of chest pain after stopping the isosorbide.  3. Hyperlipidemia -we will draw lipid levels, liver enzymes, basic metabolic profile today.  4. BPH Continue Cardura.   Kristeen Miss, MD   02/08/2020 10:08 AM    [facility] [address] [place],  [address] [place], [place]  [address] Pager [phone] Phone: [phone]; Fax: [phone]

## 2020-02-08 NOTE — Patient Instructions (Signed)
Medication Instructions:  1) STOP IMDUR (isosorbide) 2) STOP METOPROLOL *If you need a refill on your cardiac medications before your next appointment, please call your pharmacy*  Lab Work: TODAY! BMET, lipids, LFTs If you have labs (blood work) drawn today and your tests are completely normal, you will receive your results only by: Marland Kitchen MyChart Message (if you have MyChart) OR . A paper copy in the mail If you have any lab test that is abnormal or we need to change your treatment, we will call you to review the results.  Follow-Up: At [facility], you and your health needs are our priority.  As part of our continuing mission to provide you with exceptional heart care, we have created designated Provider Care Teams.  These Care Teams include your primary Cardiologist (physician) and Advanced Practice Providers (APPs -  Physician Assistants and Nurse Practitioners) who all work together to provide you with the care you need, when you need it. Your next appointment:   12 month(s) The format for your next appointment:   In Person Provider:   You may see Kristeen Miss, MD or one of the following Advanced Practice Providers on your designated Care Team:    Tereso Newcomer, PA-C  [place], [place]

## 2020-02-12 ENCOUNTER — Telehealth: Payer: Self-pay

## 2020-02-12 NOTE — Telephone Encounter (Signed)
The patient has been notified of the result and verbalized understanding.  All questions (if any) were answered. Leanord Hawking, RN 02/12/2020 8:26 AM

## 2020-02-12 NOTE — Telephone Encounter (Signed)
-----   Message from Vesta Mixer, MD sent at 02/08/2020  4:43 PM EDT ----- Labs are stable

## 2020-02-15 ENCOUNTER — Other Ambulatory Visit: Payer: Self-pay | Admitting: Cardiovascular Disease

## 2020-03-09 ENCOUNTER — Other Ambulatory Visit: Payer: Self-pay

## 2020-03-09 ENCOUNTER — Ambulatory Visit (INDEPENDENT_AMBULATORY_CARE_PROVIDER_SITE_OTHER): Payer: Medicare HMO | Admitting: Podiatry

## 2020-03-09 ENCOUNTER — Encounter: Payer: Self-pay | Admitting: Podiatry

## 2020-03-09 VITALS — BP 134/81 | HR 84 | Temp 97.3°F | Resp 16

## 2020-03-09 DIAGNOSIS — B351 Tinea unguium: Secondary | ICD-10-CM | POA: Diagnosis not present

## 2020-03-09 DIAGNOSIS — M79676 Pain in unspecified toe(s): Secondary | ICD-10-CM

## 2020-03-09 DIAGNOSIS — M2041 Other hammer toe(s) (acquired), right foot: Secondary | ICD-10-CM

## 2020-03-09 DIAGNOSIS — M2042 Other hammer toe(s) (acquired), left foot: Secondary | ICD-10-CM

## 2020-03-09 DIAGNOSIS — L84 Corns and callosities: Secondary | ICD-10-CM | POA: Diagnosis not present

## 2020-03-09 NOTE — Progress Notes (Signed)
Subjective:   Patient ID: Patrick Knapp, male   DOB: 86 y.o.   MRN: [id number]   HPI Patient presents stating that he has very thick nails 1-5 both feet that he cannot cut and are sore.  Also has a lesion fourth digit right third digit left with digital deformities and states he has trouble at times wearing shoe gear comfortably and has difficulty reaching them due to his advanced age.  Patient does not smoke likes to be active and is mentally very in control at this time   Review of Systems  All other systems reviewed and are negative.       Objective:  Physical Exam Vitals and nursing note reviewed.  Constitutional:      Appearance: He is well-developed.  Pulmonary:     Effort: Pulmonary effort is normal.  Musculoskeletal:        General: Normal range of motion.  Skin:    General: Skin is warm.  Neurological:     Mental Status: He is alert.     Neurovascular status was found to be intact muscle strength was found to be adequate range of motion was found to be within normal limits.  Patient is noted to have digital deformity of the fourth digit right third digit left that are painful on the distal parts of the toe and is noted to have digital hammertoe deformity.  Patient has thick yellow brittle nailbeds 1-5 both feet that are painful and cannot wear shoe gear comfortably     Assessment:  Chronic mycotic nail infection 1-5 both feet with lesions on both the right and left foot     Plan:  H&P reviewed hammertoes and do not recommend correction even though it may be necessary at 1 point in the future.  Today I went ahead and I debrided nailbeds 1-5 both feet with no iatrogenic bleeding and I debrided lesions bilateral no iatrogenic bleeding and discussed shoe gear modifications and cushioning for them.  We will try to avoid any surgery at this time unless the symptoms get worse

## 2020-03-25 DIAGNOSIS — Z125 Encounter for screening for malignant neoplasm of prostate: Secondary | ICD-10-CM | POA: Diagnosis not present

## 2020-03-25 DIAGNOSIS — E291 Testicular hypofunction: Secondary | ICD-10-CM | POA: Diagnosis not present

## 2020-03-25 DIAGNOSIS — E785 Hyperlipidemia, unspecified: Secondary | ICD-10-CM | POA: Diagnosis not present

## 2020-03-25 DIAGNOSIS — M859 Disorder of bone density and structure, unspecified: Secondary | ICD-10-CM | POA: Diagnosis not present

## 2020-03-28 DIAGNOSIS — G4700 Insomnia, unspecified: Secondary | ICD-10-CM | POA: Diagnosis not present

## 2020-03-28 DIAGNOSIS — D692 Other nonthrombocytopenic purpura: Secondary | ICD-10-CM | POA: Diagnosis not present

## 2020-03-28 DIAGNOSIS — I25119 Atherosclerotic heart disease of native coronary artery with unspecified angina pectoris: Secondary | ICD-10-CM | POA: Diagnosis not present

## 2020-03-28 DIAGNOSIS — K219 Gastro-esophageal reflux disease without esophagitis: Secondary | ICD-10-CM | POA: Diagnosis not present

## 2020-03-28 DIAGNOSIS — Z0000 Encounter for general adult medical examination without abnormal findings: Secondary | ICD-10-CM | POA: Diagnosis not present

## 2020-03-28 DIAGNOSIS — E669 Obesity, unspecified: Secondary | ICD-10-CM | POA: Diagnosis not present

## 2020-03-28 DIAGNOSIS — N1831 Chronic kidney disease, stage 3a: Secondary | ICD-10-CM | POA: Diagnosis not present

## 2020-03-28 DIAGNOSIS — J8410 Pulmonary fibrosis, unspecified: Secondary | ICD-10-CM | POA: Diagnosis not present

## 2020-03-28 DIAGNOSIS — E785 Hyperlipidemia, unspecified: Secondary | ICD-10-CM | POA: Diagnosis not present

## 2020-03-30 ENCOUNTER — Ambulatory Visit: Payer: Medicare HMO | Admitting: Pulmonary Disease

## 2020-04-20 ENCOUNTER — Ambulatory Visit: Payer: Medicare HMO | Admitting: Pulmonary Disease

## 2020-04-28 ENCOUNTER — Ambulatory Visit: Payer: Medicare HMO | Admitting: Pulmonary Disease

## 2020-05-16 ENCOUNTER — Other Ambulatory Visit: Payer: Self-pay

## 2020-05-16 ENCOUNTER — Encounter: Payer: Self-pay | Admitting: Pulmonary Disease

## 2020-05-16 ENCOUNTER — Ambulatory Visit: Payer: Medicare HMO | Admitting: Pulmonary Disease

## 2020-05-16 VITALS — BP 124/72 | HR 92 | Temp 97.7°F | Ht 68.0 in | Wt 181.4 lb

## 2020-05-16 DIAGNOSIS — J84112 Idiopathic pulmonary fibrosis: Secondary | ICD-10-CM

## 2020-05-16 DIAGNOSIS — Z5181 Encounter for therapeutic drug level monitoring: Secondary | ICD-10-CM | POA: Diagnosis not present

## 2020-05-16 LAB — HEPATIC FUNCTION PANEL
ALT: 12 U/L (ref 0–53)
AST: 17 U/L (ref 0–37)
Albumin: 4 g/dL (ref 3.5–5.2)
Alkaline Phosphatase: 84 U/L (ref 39–117)
Bilirubin, Direct: 0.1 mg/dL (ref 0.0–0.3)
Total Bilirubin: 0.5 mg/dL (ref 0.2–1.2)
Total Protein: 7.6 g/dL (ref 6.0–8.3)

## 2020-05-16 NOTE — Patient Instructions (Signed)
Ambulatory saturation. Check LFTs today.

## 2020-05-16 NOTE — Assessment & Plan Note (Addendum)
He has tolerated Esbriet well. We will check LFTs today. Overall disease appears to be stable.  We discussed monitoring of IPF with imaging and spirometry.  He feels that given his advanced age this would be unnecessary and I agree. We will monitor him with ambulatory saturations and symptoms and should his symptoms worsen we can then undertake imaging if required We discussed warning signs for worsening IPF

## 2020-05-16 NOTE — Progress Notes (Signed)
   Subjective:    Patient ID: Patrick Knapp, male    DOB: 24-Apr-1928, 86 y.o.   MRN: [id number]  HPI  21 yonever smokerFor follow-up ofIdiopathicpulmonary fibrosis.  He started on OFEV2/2019 forslight drop in lung function/FVCbut this was stopped due to diarrhea in spite of decreasing the dose.He started on Esbriet 01/2019   Overall his breathing is stable.  He has limited activity.  He does play at the park with his friends.  He plays poker he has an active social life. He does not want repeated PFTs performed He is compliant with Esbriet.  He feels that this is a super immune booster.  Denies nausea vomiting or diarrhea Immunizations are up-to-date  Significant tests/ events reviewed  HRCT 08/2018 "probable" UIP,no honeycombing,unchanged from 2018  CT chest 2011 showed diffuse right greater than left peripheral and subpleural reticular fibrosis. Ambulatory O2 saturation on room air without desaturation 12/28/2016 ANA , CCP neg  HRCT6/2054fibrotic interstitial lung disease with mild honeycombing. Slight basilar predominance and slight asymmetrical involvement of the right lung with some progression since 2011.  PFTs 01/2017 no obs, FEV1 96%, FVC 76% DLCO was noted 57% in 04/2017.  Spirometry12/2018slight drop in FVC 68% 11/2017 FVC 65% 05/2018 FVC 49 %  Review of Systems neg for any significant sore throat, dysphagia, itching, sneezing, nasal congestion or excess/ purulent secretions, fever, chills, sweats, unintended wt loss, pleuritic or exertional cp, hempoptysis, orthopnea pnd or change in chronic leg swelling. Also denies presyncope, palpitations, heartburn, abdominal pain, nausea, vomiting, diarrhea or change in bowel or urinary habits, dysuria,hematuria, rash, arthralgias, visual complaints, headache, numbness weakness or ataxia.     Objective:   Physical Exam  Gen. Pleasant, well-nourished,elderly, in no distress ENT - no thrush, no  pallor/icterus,no post nasal drip Neck: No JVD, no thyromegaly, no carotid bruits Lungs: no use of accessory muscles, no dullness to percussion, BL 1/3 basal rales no rhonchi  Cardiovascular: Rhythm regular, heart sounds  normal, no murmurs or gallops, no peripheral edema Musculoskeletal: No deformities, no cyanosis or clubbing         Assessment & Plan:

## 2020-05-18 ENCOUNTER — Encounter: Payer: Self-pay | Admitting: *Deleted

## 2020-06-10 ENCOUNTER — Ambulatory Visit: Payer: Medicare HMO | Admitting: Podiatry

## 2020-07-22 DIAGNOSIS — E785 Hyperlipidemia, unspecified: Secondary | ICD-10-CM | POA: Diagnosis not present

## 2020-07-22 DIAGNOSIS — M25431 Effusion, right wrist: Secondary | ICD-10-CM | POA: Diagnosis not present

## 2020-07-22 DIAGNOSIS — M10031 Idiopathic gout, right wrist: Secondary | ICD-10-CM | POA: Diagnosis not present

## 2020-07-22 DIAGNOSIS — M25531 Pain in right wrist: Secondary | ICD-10-CM | POA: Diagnosis not present

## 2020-08-18 ENCOUNTER — Telehealth: Payer: Self-pay | Admitting: Pulmonary Disease

## 2020-08-18 NOTE — Telephone Encounter (Signed)
Esbriet is no longer on pt's med list.  Called pt's home number and spoke to a male and was told it was the wrong number.  Called and left a VM on the mobile number for pt to call back to confirm pt still taking Esbiret at 801mg  TID.

## 2020-08-19 NOTE — Telephone Encounter (Signed)
Looked at pt's last OV and saw where RA said to continue taking Esbriet 801mg . Went to meds to be reconciled and found the Esbriet 801mg  in there. I have readded this to pt's med list. [facility] and spoke with , pharmacist and provided a verbal of pt's Esbriet Rx.  Called and spoke with pt letting him know this had been done and he verbalized understanding. Nothing further needed.

## 2020-09-15 ENCOUNTER — Ambulatory Visit: Payer: Medicare HMO | Admitting: Pulmonary Disease

## 2020-09-15 ENCOUNTER — Encounter: Payer: Self-pay | Admitting: Pulmonary Disease

## 2020-09-15 ENCOUNTER — Other Ambulatory Visit: Payer: Self-pay

## 2020-09-15 VITALS — BP 146/74 | HR 94 | Temp 97.4°F | Ht 68.0 in | Wt 183.8 lb

## 2020-09-15 DIAGNOSIS — J84112 Idiopathic pulmonary fibrosis: Secondary | ICD-10-CM | POA: Diagnosis not present

## 2020-09-15 DIAGNOSIS — Z5181 Encounter for therapeutic drug level monitoring: Secondary | ICD-10-CM | POA: Diagnosis not present

## 2020-09-15 LAB — HEPATIC FUNCTION PANEL
ALT: 16 U/L (ref 0–53)
AST: 17 U/L (ref 0–37)
Albumin: 3.8 g/dL (ref 3.5–5.2)
Alkaline Phosphatase: 84 U/L (ref 39–117)
Bilirubin, Direct: 0.1 mg/dL (ref 0.0–0.3)
Total Bilirubin: 0.5 mg/dL (ref 0.2–1.2)
Total Protein: 7.4 g/dL (ref 6.0–8.3)

## 2020-09-15 NOTE — Patient Instructions (Signed)
  Stay on esbriet LFTs

## 2020-09-15 NOTE — Assessment & Plan Note (Signed)
Appears stable by history and exam.  He does not want any less testing and we will respect that and avoid CT scans and PFTs and follow him clinically.  We can assess him for oxygen desaturation intermittently but he really does not want oxygen unless he absolutely needs it. He recovers quickly with exertion. He has completed pulmonary rehab in the past We will check LFTs for drug monitoring, he is good about using sunscreen No side effects from his.

## 2020-09-15 NOTE — Progress Notes (Signed)
Called and went over lab results per Dr Alva with patient. All questions answered and patient expressed full understanding. Nothing further needed at this time.

## 2020-09-15 NOTE — Progress Notes (Signed)
   Subjective:    Patient ID: Patrick Knapp, male    DOB: 1928-01-05, 87 y.o.   MRN: [id number]  HPI  55 yonever smokerFor follow-up ofIdiopathicpulmonary fibrosis.  He started on OFEV2/2019 forslight drop in lung function/FVCbut this was stopped due to diarrhea in spite of decreasing the dose.He resumed Esbriet 01/2019   He is doing well.  He is starting to get out a little bit more.  Still has a little bit of mucus production.  For the last 3 years he has not had any episode of bronchitis and he is happy about that.  He thinks pirfenidone is a super immune booster. He plays with his friends at the park. 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 he had a great 89th birthday Previous LFTs okay  Significant tests/ events reviewed  HRCT 08/2018 "probable" UIP,no honeycombing,unchanged from 2018  CT chest 2011 showed diffuse right greater than left peripheral and subpleural reticular fibrosis. Ambulatory O2 saturation on room air without desaturation 12/28/2016 ANA , CCP neg  HRCT6/2068fibrotic interstitial lung disease with mild honeycombing. Slight basilar predominance and slight asymmetrical involvement of the right lung with some progression since 2011.  PFTs 01/2017 no obs, FEV1 96%, FVC 76% DLCO was noted 57% in 04/2017.  Spirometry12/2018slight drop in FVC 68% 11/2017 FVC 65% 05/2018 FVC 49 %   Review of Systems neg for any significant sore throat, dysphagia, itching, sneezing, nasal congestion or excess/ purulent secretions, fever, chills, sweats, unintended wt loss, pleuritic or exertional cp, hempoptysis, orthopnea pnd or change in chronic leg swelling. Also denies presyncope, palpitations, heartburn, abdominal pain, nausea, vomiting, diarrhea or change in bowel or urinary habits, dysuria,hematuria, rash, arthralgias, visual complaints, headache, numbness weakness or ataxia.     Objective:    Physical Exam  Gen. Pleasant, elderly,well-nourished, in no distress ENT - no thrush, no pallor/icterus,no post nasal drip Neck: No JVD, no thyromegaly, no carotid bruits Lungs: no use of accessory muscles, no dullness to percussion, bibasal 1/3 rales no rhonchi  Cardiovascular: Rhythm regular, heart sounds  normal, no murmurs or gallops, no peripheral edema Musculoskeletal: No deformities, no cyanosis or clubbing         Assessment & Plan:

## 2020-09-27 ENCOUNTER — Other Ambulatory Visit: Payer: Self-pay | Admitting: Cardiovascular Disease

## 2020-12-21 ENCOUNTER — Other Ambulatory Visit: Payer: Self-pay | Admitting: Internal Medicine

## 2020-12-21 DIAGNOSIS — Z006 Encounter for examination for normal comparison and control in clinical research program: Secondary | ICD-10-CM

## 2020-12-21 DIAGNOSIS — J84112 Idiopathic pulmonary fibrosis: Secondary | ICD-10-CM

## 2020-12-26 ENCOUNTER — Other Ambulatory Visit: Payer: Self-pay

## 2020-12-26 ENCOUNTER — Encounter: Payer: Medicare HMO | Admitting: *Deleted

## 2020-12-26 DIAGNOSIS — Z006 Encounter for examination for normal comparison and control in clinical research program: Secondary | ICD-10-CM

## 2020-12-26 DIAGNOSIS — J84112 Idiopathic pulmonary fibrosis: Secondary | ICD-10-CM

## 2020-12-26 NOTE — Research (Signed)
Title: A randomized, double-blind, multicenter, dose-ranging, placebo-controlled Phase 2a evaluation of the safety, tolerability and pharmacokinetics of [id number] in participants with idiopathic pulmonary fibrosis (IPF)   Patients will be randomized in a 3:1 ratio to one of two treatment arms:  320 mg [id number] or placebo.   Protocol #: [id number], Clinical Trials EUDRACT# [id number]  ** Sponsor [employer] [email or web address]   [facility] [place], [place] [address])   Protocol Version Amendment 3 (ver.3.0) for 12/26/2020 Date of 19OCT2021  (double checked  yes) Consent Version 5.1 dated 11Jan2022, approved  for 12/26/2020 date of  (double checked  yes) Investigator Brochure Version 5.0  Addendum for 12/26/2020 date of 21JAN2022   Key Inclusion Criteria:   Age >/= 85 years old Confident diagnosis of IPF  up to 5 years. If IPF diagnosis is within > 3 to = 5 years at screening, the participant must have evidence of progression within the last 24 months, as defined by decline in FVC percent predicted based on a relative decline of = 5% FVC% pred >/=45%  DLCO (hgb-adjusted) >/=30% Treatment with nintedanib or pirfenidone allowed, But must have stable dose =3 months before Screening Visit and remain unchanged during the study   Key Exclusion Criteria:   Receiving any non-approved agent intended for treatment of fibrosis in IPF FEV1 over the FVC ratio < 0.7 at screening Active infection,  that can affect FVC measurement during Screening or  Randomization Extent of emphysema > fibrotic changes on most recent HRCT IPF exacerbation within 6 months of Screening  Severe pulmonary hypertension Smoking of any kind w/in 3 months of Screening or unwilling to avoid smoking throughout the study Renal impairment or end-stage kidney disease requiring dialysis History of unstable /deteriorating cardiac/pulmonary disease (other than IPF) w/in 6 months of Screening Any ongoing known malignancy, except  for localized cancers (i.e. basal cell carcinoma) Medical or surgical condition known to affect drug absorption Surgeries scheduled during the study period    Side-Effects: [id number] was generally well tolerated in over 280 healthy participants with no clinical safety concerns or dose-limiting toxicities observed for single doses up to 640mg  and multiple doses up to 320mg  QD administered for up to 14 days. The major side-effects reported have been headache (mild) and constipation (mild) in 3-76% of subjects. Other side effects, in 1-2% of subjects, were mild to moderate, with fever being the only severe side-effect reported.  Mechanism: Selective dual aV6 and aV1 integrin inhibitor with antifibrotic activity. prevents aV6 and aV1 from binding to the RGD sequence of latency-associated peptide (LAP), this blocks the release of activated TGF-1, thereby blocking the activation of pro-fibrotic pathways. Additionally, 08-07-2003 has been shown to reduce collagen synthesis and gene expression in human IPF tissue.   Interactions: Acts as a substrate of P-gp, OATP1B1, OATP1B3, OAT3 Inhibitors or inducers of these enzymes may affect the intestinal absorption, hepatic uptake or renal uptake of [id number] Metabolized by CYP3A4, CYP2C9, CYP2C19 Potent inhibitors or inducers of these enzymes should be avoided Each CYP enzyme metabolizes < 10% of the parent compound Since multiple CYP enzymes are involved, clinically relevant drug-drug interactions are less likely, as there are other alternative pathways A potent inhibitor of multiple pathways should be used with caution as that has the potential to elicit a clinically relevant drug-drug interaction i.e. medication that inhibits CYP3A4, CYP2C9, CYP2C19 [id number] is not an inducer of CYP1A2, CYP2B6, or CYP3A4 Showed moderate inhibition of P-gp and MATE1, substrates of P-gp and MATE1 may  have increased exposure when coadministered with  [id number] Metabolized by extrahepatic enzymes: CYP1A1, UGT1A7 Clinical relevance relating to drug interactions is unknown Stable when incubated with human recombinant MAO-A and MAO-B [id number] is not an inhibitor or inducer of CYP isoforms and may be co-administered with substrates of CYP isoforms without risk of DDI.  Interactions with specific drugs: Potential for increased exposure of nintedanib Recommend separating time of administration of [id number] and nintedanib Drug interactions with pirfenidone are not expected Use of corticosteroids should be discussed beforehand with the medical monitor   Clinical drug interactions studies have not been performed at this time and therefore guidance is based on the in-vitro studies  Disallowed Medications/Foods/Supplements: Fluconazole Digoxin Rifampin Grapefruit Grapefruit-containing foods and beverages St. John's Wort   Pharmacokinetics: Tmax : 2-3 hours Half-life: approximately 48 hours Steady state reached in 5-7 days Minimal renal clearance (8.5% of drug is cleared by the kidneys)    Administration [phone] should be administered on an empty stomach May be administered via feeding tube   Safety:   Edition Addendum to Edition 5.0 - 29 July 2020   Animal studies: No effect on neurobehavior No effect on respiratory parameters No effects on blood pressure Showed an increase in QT and QTc The high dose 400 mg/kg showed an increase in mean heart rate   Cardiovascular effects in mice after administration of [id number]: Dose-dependent increases in blood pressure Increase in PR and QRS duration Increase in arrhythmias  Reproductive animal toxicology studies at the low and medium doses, 150 and 300 mg/kg/day, revealed no impact on: maternal survival, fetal body weight, food parameters, uterine and ovarian parameters, no external malformations  When the high dose 600 mg/kg/day was tested in rabbits, the rabbits displayed  increased morbidities and increased abortions   Phase 1 studies:   Type of TEAE [id number] n=32 Placebo n=14 GI disorders 7 (22%) 0 Infections 3 (9%) 0 Musculoskeletal 1 (3%) 0 Nervous system disorders 2 (6%) 0   Most common treatment-emergent adverse event (TEAE):  headache  The most common drug-related adverse events were: nausea, diarrhea, abdominal distension. These occurred in 12.5% of participants  There were no deaths or serious adverse events in any of the Phase I studies There were no clinically meaningful changes in vital signs, telemetry or laboratory parameters [phone] did not display any clinically significant effect on ECG (iIncluding Qtc, HR, RR, PR and QRS)  Study [id number] All TEAEs were mild and deemed unrelated to study drug One TEAE, viral infection, led to discontinuation of IP No deaths nor SAEs  As of the 08 April 2020 cutoff date, preliminary data from ongoing and discontinued Phase 2a studies suggest no new safety concerns.  All SAE's reported to date were considered unrelated to study drug by the investigator. All treatment-emergent SAE's were known manifestations of the underlying  diseases (IPF, PSC or ARDS) and considered by the investigator to be most likely due to disease progresssion.  Part D of the study will evaluate approximately 28 participants at the 320 mg dose for at least 24 weeks to further characterize the safety profile of [id number]. Ongoing safety and FVC assessments will continue until the last participant reaches 24 weeks of the study; therefore, safety and FVC assessments may extend to 48 weeks in participants randomized earlier in the study.  Clinical Research Coordinator / Research RN note : This visit for Subject Patrick Knapp with DOB: [birth year] on 12/26/2020 for the above protocol is Visit #1 and is for purpose of research.   The consent for this encounter is under  Protocol Version Amendment 3.0 ,  Investigator Brochure Edition v5.0 Addendum Consent Version 5.1 and is currently IRB approved.   Subject expressed continued interest and consent in continuing as a study subject. Subject confirmed that there was no change in contact information (e.g. address, telephone, email). Subject thanked for participation in research and contribution to science.  In this visit 12/26/2020 the subject will be evaluated by sub-investigator named Marga Melnick, MD . This research coordinator has verified that the investigator is up to date with his training logs. Because the PI is NOT available due to schedule issues, the sub-I reported and CRC has confirmed that the PI has discussed the visit a-priori with the sub-investigator.  The subject was allowed ample time to review the informed consent form and all questions were answered. The subject then signed the informed consent form. No study related procedures were performed prior to signing the consent. Refer to the Informed Consent Process Documentation Checklist for further details of the consent process. The sub-I, Dr. Alwyn Ren, was present for the consent process. All study visit 1 procedures and assessments were performed per the above mentioned protocol. No AE's were reported. If the subject meets all criteria for study participation, he will return within the next 28 days for study visit 2.  Signed by Delman Cheadle, MS, [facility]  Clinical Research Coordinator  [place], [place] 2:11 PM 12/26/2020

## 2020-12-27 ENCOUNTER — Ambulatory Visit (INDEPENDENT_AMBULATORY_CARE_PROVIDER_SITE_OTHER)
Admission: RE | Admit: 2020-12-27 | Discharge: 2020-12-27 | Disposition: A | Discharge status: Home / Self Care | Payer: Self-pay | Source: Ambulatory Visit | Attending: Internal Medicine | Admitting: Internal Medicine

## 2020-12-27 DIAGNOSIS — K449 Diaphragmatic hernia without obstruction or gangrene: Secondary | ICD-10-CM | POA: Diagnosis not present

## 2020-12-27 DIAGNOSIS — Z006 Encounter for examination for normal comparison and control in clinical research program: Secondary | ICD-10-CM

## 2020-12-27 DIAGNOSIS — J84112 Idiopathic pulmonary fibrosis: Secondary | ICD-10-CM

## 2020-12-27 DIAGNOSIS — J479 Bronchiectasis, uncomplicated: Secondary | ICD-10-CM | POA: Diagnosis not present

## 2020-12-27 DIAGNOSIS — I700 Atherosclerosis of aorta: Secondary | ICD-10-CM | POA: Diagnosis not present

## 2020-12-27 DIAGNOSIS — I2510 Atherosclerotic heart disease of native coronary artery without angina pectoris: Secondary | ICD-10-CM | POA: Diagnosis not present

## 2020-12-29 NOTE — Progress Notes (Signed)
Mr. Patrick Knapp was seen for the initial [facility] Study Visit 1. By history his cardiopulmonary symptoms in reference to IPF are stable. Positive physical findings included myriad cherry angioma of the trunk.  Very subtle clubbing of the nailbeds was present.  He exhibited a bradyarrhythmia with slight accentuation of S2 with intermittent splitting of S2.  He had fine rales posteriorly in a homogenous distribution.                                             W.F .Linna Darner MD, [place]

## 2020-12-30 ENCOUNTER — Telehealth: Payer: Self-pay | Admitting: Pulmonary Disease

## 2020-12-30 NOTE — Telephone Encounter (Signed)
Called and spoke with Patient.  Patient stated he had blood work with [facility] this week.  Patient stated he was told he had low kidney function.  Patient requested labs be sent to PCP and Dr. Vassie Loll. Blood work is not showing in epic.    Message routed to [place], [employer]

## 2021-01-01 NOTE — Progress Notes (Signed)
Patrick Knapp. This is Patrick Knapp patient. He had HRCT . Pleas elet him know CT shows IPF. Npo cancer. He also has aortic valve calcifications. He should discuss thse with Vassie Loll. The CT was done as part of research but he screen failed due to low GFR. Thse standard of care part of the CT - he can talk to Dr Vassie Loll

## 2021-01-05 NOTE — Telephone Encounter (Signed)
Labs were sent to the patients primary care doctor per the patients request.

## 2021-01-16 ENCOUNTER — Ambulatory Visit: Payer: Medicare HMO | Admitting: Primary Care

## 2021-01-16 ENCOUNTER — Encounter: Payer: Self-pay | Admitting: Primary Care

## 2021-01-16 ENCOUNTER — Telehealth: Payer: Self-pay | Admitting: Primary Care

## 2021-01-16 ENCOUNTER — Other Ambulatory Visit: Payer: Self-pay

## 2021-01-16 VITALS — BP 130/82 | HR 84 | Ht 68.0 in | Wt 183.8 lb

## 2021-01-16 DIAGNOSIS — Z5181 Encounter for therapeutic drug level monitoring: Secondary | ICD-10-CM | POA: Diagnosis not present

## 2021-01-16 DIAGNOSIS — J84112 Idiopathic pulmonary fibrosis: Secondary | ICD-10-CM | POA: Diagnosis not present

## 2021-01-16 NOTE — Patient Instructions (Addendum)
Recommendations: - Continue Esbriet 801mg  three times daily   Orders: - LFTs in 3 months  - Ambulatory walk test today for POC   Follow-up: - 3-4 months with Dr. or sooner if needed

## 2021-01-16 NOTE — Assessment & Plan Note (Addendum)
-   Stable interval; No acute respiratory compliant. Tolerating Esbriet 801mg  TID. He is not candidate for [facility] research study d.t kidney function. HRCT June 2022 showed minimal progression of ILD compared to prior exam in February 2020. He had labs done with [facility], requesting LFTs. He did not desaturate on simple walk test today. He was able to maintain O2 >91% after 3 laps. No changes today. FU in 3 months with Dr. March 2020.

## 2021-01-16 NOTE — Progress Notes (Signed)
@Patient  ID: , male    DOB: [birth year], 87 y.o.   MRN: [id number]  Chief Complaint  Patient presents with   Follow-up    Patient reports doing well,.     Referring provider: [id number], MD  HPI: 87 year old male, never smoked.  Past medical history significant for IPF, hypertension, GERD.  Patient of Dr. 95, last seen on 09/15/20. He was started on OFEV in February 2019 for decrease in FVC. Dose decreased to 100mg  BID d/t diarrhea symptoms. Maintained Esbriet 801mg  daily, started 01/21/19. Tolerating well with no side effects.   01/16/2021- Interim hx  Patient presents today for 3-4 month follow-up. During his last visit with Dr. , patient's IPF was felt to be clinically stable based on history and exam. He does not want further testing, Dr. 01/23/19 and the patient have elected to avoid CT scans and PFTs. Plan is to monitor clinically, periodically assess for oxygen desaturation and check LFTs for drug monitoring. He has completed pulmonary rehab in the past.   Breathing over it is ok. He gets out of breath with heavy lifting and with heat/humidity. He does not have a significant cough. Cough is productive with white mucus. He is tolerating Esbriet TID without any side effects. Denies weight loss, GI symptoms, nausea or vomiting.   He saw [facility] for initial pliant study visit 1, he had blood work with them and reports that he was disqualified d/t his kidney function. He also had CT chest with Dr. 03/19/2021 which showed ILD consistently with UIP, minimal progression compared to prior exam.    Allergies  Allergen Reactions   Chlorhexidine Gluconate Itching   Iodinated Diagnostic Agents     Not really sure of the incident// patient denies allergy and has had catherterization without incident.    Immunization History  Administered Date(s) Administered   Influenza Whole 04/22/2017   Influenza, High Dose Seasonal PF 04/08/2016, 04/11/2017, 03/10/2019    PFIZER(Purple Top)SARS-COV-2 Vaccination 08/15/2019, 09/09/2019, 05/15/2020    Past Medical History:  Diagnosis Date   Arthritis    "all the joints" (02/06/2016)   Coronary artery disease    a. PCI with DES to mid LAD 7/17 b.post PTCA and stenting of his left circumflex artery in 12/08   Enlarged prostate    GERD (gastroesophageal reflux disease)    Hyperlipidemia    Hypertension    Ischemia    Pulmonary fibrosis, unspecified (HCC)    Shortness of breath    Skin disorder    Occurs at times as rash, whelps, blister-like areas  with itching sometimes-has occurred for years , but can not find an exact dx or cause    Tobacco History: Social History   Tobacco Use  Smoking Status Never  Smokeless Tobacco Never   Counseling given: Not Answered   Outpatient Medications Prior to Visit  Medication Sig Dispense Refill   acetaminophen (TYLENOL) 650 MG CR tablet Take 650 mg by mouth every 8 (eight) hours as needed for pain.     ALPRAZolam (XANAX) 0.5 MG tablet Take 0.5 mg by mouth 3 (three) times daily as needed. AS NEEDED FOR ANXIETY; taking at bedtime only for insomnia     amLODipine (NORVASC) 2.5 MG tablet TAKE 1 TABLET EVERY DAY 90 tablet 3   atorvastatin (LIPITOR) 20 MG tablet Take 20 mg by mouth daily.      benzonatate (TESSALON PERLES) 100 MG capsule Take 2 capsules (200 mg total) by mouth every 8 (eight) hours as needed for cough.  90 capsule 3   cholecalciferol (VITAMIN D) 1000 units tablet Take 1,000 Units by mouth daily.     clopidogrel (PLAVIX) 75 MG tablet Take 1 tablet (75 mg total) by mouth daily. 30 tablet 11   doxazosin (CARDURA) 4 MG tablet Take 2 mg by mouth every morning.      ESBRIET 801 MG TABS Take 801 mg by mouth in the morning, at noon, and at bedtime.     fluticasone (FLONASE) 50 MCG/ACT nasal spray Place 1 spray into both nostrils daily. 16 g 2   gabapentin (NEURONTIN) 400 MG capsule      Guaifenesin 1200 MG TB12 Take 1 tablet (1,200 mg total) by mouth 2 (two)  times daily. 60 tablet 3   magnesium gluconate (MAGONATE) 500 MG tablet Take 500 mg by mouth daily.     nitroGLYCERIN (NITROSTAT) 0.4 MG SL tablet Place 1 tablet (0.4 mg total) under the tongue every 5 (five) minutes as needed for chest pain. 25 tablet 3   pantoprazole (PROTONIX) 40 MG tablet Take 40 mg by mouth daily.     sodium chloride (OCEAN) 0.65 % SOLN nasal spray Place 1 spray into both nostrils daily. 15 mL 3   No facility-administered medications prior to visit.    Review of Systems  Review of Systems  Constitutional: Negative.   HENT: Negative.    Respiratory:  Positive for cough.        Dyspnea with heat  Cardiovascular: Negative.     Physical Exam  BP 130/82 (BP Location: Left Arm, Patient Position: Sitting, Cuff Size: Normal)   Pulse 84   Ht 5\' 8"  (1.727 m)   Wt 183 lb 12.8 oz (83.4 kg)   SpO2 95%   BMI 27.95 kg/m  Physical Exam Constitutional:      Appearance: Normal appearance.  HENT:     Head: Normocephalic and atraumatic.  Cardiovascular:     Rate and Rhythm: Normal rate and regular rhythm.  Pulmonary:     Breath sounds: Rales present.  Neurological:     General: No focal deficit present.     Mental Status: He is alert and oriented to person, place, and time. Mental status is at baseline.  Psychiatric:        Mood and Affect: Mood normal.        Behavior: Behavior normal.        Thought Content: Thought content normal.        Judgment: Judgment normal.     Lab Results:  CBC    Component Value Date/Time   WBC 6.9 01/12/2019 1145   RBC 4.37 01/12/2019 1145   HGB 12.3 (L) 01/12/2019 1145   HCT 37.3 (L) 01/12/2019 1145   PLT 118.0 (L) 01/12/2019 1145   MCV 85.4 01/12/2019 1145   MCH 28.2 02/07/2016 0256   MCHC 33.0 01/12/2019 1145   RDW 15.1 01/12/2019 1145   LYMPHSABS 1.3 01/12/2019 1145   MONOABS 0.8 01/12/2019 1145   EOSABS 0.1 01/12/2019 1145   BASOSABS 0.1 01/12/2019 1145    BMET    Component Value Date/Time   NA 140 02/08/2020  1042   K 4.4 02/08/2020 1042   CL 106 02/08/2020 1042   CO2 22 02/08/2020 1042   GLUCOSE 109 (H) 02/08/2020 1042   GLUCOSE 108 (H) 03/26/2016 0747   BUN 18 02/08/2020 1042   CREATININE 1.40 (H) 02/08/2020 1042   CREATININE 1.21 (H) 03/26/2016 0747   CALCIUM 9.1 02/08/2020 1042   GFRNONAA 44 (L) 02/08/2020  1042   GFRAA 51 (L) 02/08/2020 1042    BNP No results found for: BNP  ProBNP No results found for: PROBNP  Imaging: CT Chest High Resolution  Result Date: 12/28/2020 CLINICAL DATA:  87 year old male with history of idiopathic pulmonary fibrosis. EXAM: CT CHEST WITHOUT CONTRAST TECHNIQUE: Multidetector CT imaging of the chest was performed following the standard protocol without intravenous contrast. High resolution imaging of the lungs, as well as inspiratory and expiratory imaging, was performed. COMPARISON:  CT of the chest 08/13/2018. FINDINGS: Cardiovascular: Heart size is normal. There is no significant pericardial fluid, thickening or pericardial calcification. There is aortic atherosclerosis, as well as atherosclerosis of the great vessels of the mediastinum and the coronary arteries, including calcified atherosclerotic plaque in the left main, left anterior descending, left circumflex and right coronary arteries. Calcifications of the aortic valve. Mediastinum/Nodes: No pathologically enlarged mediastinal or hilar lymph nodes. Please note that accurate exclusion of hilar adenopathy is limited on noncontrast CT scans. Small hiatal hernia. No axillary lymphadenopathy. Lungs/Pleura: High-resolution images again demonstrate widespread peripheral predominant areas of septal thickening, subpleural reticulation, parenchymal banding, traction bronchiectasis and honeycombing (most evident in the right lower lobe on axial image 108 of series 4). Fine peripheral parenchymal calcifications are also noted throughout the lungs bilaterally (right greater than left). Findings appear mildly  progressive compared to the prior study. Inspiratory and expiratory imaging is unremarkable. No acute consolidative airspace disease. No pleural effusions. No definite suspicious appearing pulmonary nodules or masses are noted. Upper Abdomen: Low-attenuation lesions in the right kidney incompletely imaged, but similar to prior study from 08/13/2018, statistically likely to represent cysts. 1.5 cm low-attenuation lesion in segment 3 of the liver, incompletely characterized on today's noncontrast CT examination but also similar to the prior study and statistically likely to represent a cyst. Aortic atherosclerosis. Musculoskeletal: There are no aggressive appearing lytic or blastic lesions noted in the visualized portions of the skeleton. IMPRESSION: 1. The appearance of the lungs is compatible with interstitial lung disease, categorized as compatible with usual interstitial pneumonia (UIP) per current ATS guidelines. Minimal progression compared to the prior examination. 2. Aortic atherosclerosis, in addition to left main and 3 vessel coronary artery disease. 3. There are calcifications of the aortic valve. Echocardiographic correlation for evaluation of potential valvular dysfunction may be warranted if clinically indicated. Aortic Atherosclerosis (ICD10-I70.0). Electronically Signed   By: Trudie Reed M.D.   On: 12/28/2020 09:52     Assessment & Plan:   IPF (idiopathic pulmonary fibrosis) (HCC) - Stable interval; No acute respiratory compliant. Tolerating Esbriet 801mg  TID. He is not candidate for [employer] research study d.t kidney function. HRCT June 2022 showed minimal progression of ILD compared to prior exam in February 2020. He had labs done with [facility], requesting LFTs. He did not desaturate on simple walk test today. He was able to maintain O2 >91% after 3 laps. No changes today. FU in 3 months with Dr. March 2020.    Vassie Loll, NP 01/16/2021

## 2021-01-16 NOTE — Telephone Encounter (Signed)
Can I get a copy of patients LFTs? He was seen by you guys for consideration for pliant study but did not qualify d/t kidney function. He is esbriet.

## 2021-01-23 ENCOUNTER — Telehealth: Payer: Self-pay | Admitting: Pulmonary Disease

## 2021-01-23 MED ORDER — ESBRIET 801 MG PO TABS: 801.0000 mg | ORAL_TABLET | Freq: Three times a day (TID) | ORAL | 6 refills | Status: DC

## 2021-01-23 NOTE — Telephone Encounter (Signed)
Called and spoke with patient who states that he has been getting Esbriet for over a year and needs RX sent to [facility] stating to send Esbriet and they will send it to him and not generic. RX has been sent, advised patient to call if there is any issues. Nothing further needed at this time.

## 2021-02-05 ENCOUNTER — Encounter: Payer: Self-pay | Admitting: Cardiovascular Disease

## 2021-02-05 NOTE — Progress Notes (Signed)
Patrick Knapp Date of Birth  [birth year] [facility] [address]    [address] [place], [place]  [address] [phone]  Fax  [phone]   Problem list: 1. Coronary artery disease 2. Hypertension 3. Hyperlipidemia 4. BPH   Patrick Knapp is an 87 y.o.  with a hx of CAD, HTN, hyperlipidemia, and BPH.  He is not had any episodes of chest pain or shortness breath. He's exploring some alternate therapies for his BPH including cryotherapy.  He works at [employer] for 4 hours every day. He does not get any other regular exercise.  Sept. 9, 2015:  Patrick Knapp has had some DOE walking [place] the airport terminal .   No CP -   Similar to how he felt prior to stenting  He has a right knee injury - needs knee replacement on Nov. 23. ( [facility])  Has not been able to exercise.    January 23, 2016:  Patrick Knapp is seen today after a 2 year absence.   Seen with his daughter, Patrick Knapp  He complains of lots of fatigue. His last Myoview study was 03/25/2016. He had no evidence of ischemia at that time and had normal left ventricle systolic function.  Has severe exhaustion with any exertion .   Has lots of DOE   thinks that he feels similar to when he had his stent placed . He has never had CP - even prior to stenting  His DOE seems to be worsening   DOE even walking to his mail box   Jun 04, 2016:  Patrick Knapp had PCI ( [employer])   of his LAD in July . Doing much better  Still having DOE and fatigue walking up hills  Had talked to [facility] about starting Imdur if needed.   Nov 27, 2016:  Doing well.  Has some exertional leg fatigue.  No CP or dyspnea.   Nov. 20, 2019  No CP  Has some DOE with walking up a hill.  Has IPF  February 08, 2020: Has a history of coronary artery disease.  He stenting of his LAD in July, 2017.  He also has idiopathic pulmonary fibrosis.   Aug. 1, 2022:  Patrick Knapp is seen today for follow up of his CAD  Has idiopathic pulmonary fibroosis No angina  Has chronic  dyspnea      Current Outpatient Medications on File Prior to Visit  Medication Sig Dispense Refill   acetaminophen (TYLENOL) 650 MG CR tablet Take 650 mg by mouth every 8 (eight) hours as needed for pain.     ALPRAZolam (XANAX) 0.5 MG tablet Take 0.5 mg by mouth 3 (three) times daily as needed. AS NEEDED FOR ANXIETY; taking at bedtime only for insomnia     amLODipine (NORVASC) 2.5 MG tablet TAKE 1 TABLET EVERY DAY 90 tablet 3   atorvastatin (LIPITOR) 20 MG tablet Take 20 mg by mouth daily.      benzonatate (TESSALON PERLES) 100 MG capsule Take 2 capsules (200 mg total) by mouth every 8 (eight) hours as needed for cough. 90 capsule 3   cholecalciferol (VITAMIN D) 1000 units tablet Take 1,000 Units by mouth daily.     clopidogrel (PLAVIX) 75 MG tablet Take 1 tablet (75 mg total) by mouth daily. 30 tablet 11   doxazosin (CARDURA) 4 MG tablet Take 2 mg by mouth every morning.      ESBRIET 801 MG TABS Take 801 mg by mouth in the morning, at noon, and  at bedtime. 90 tablet 6   fluticasone (FLONASE) 50 MCG/ACT nasal spray Place 1 spray into both nostrils daily. 16 g 2   gabapentin (NEURONTIN) 400 MG capsule      Guaifenesin 1200 MG TB12 Take 1 tablet (1,200 mg total) by mouth 2 (two) times daily. 60 tablet 3   magnesium gluconate (MAGONATE) 500 MG tablet Take 500 mg by mouth daily.     nitroGLYCERIN (NITROSTAT) 0.4 MG SL tablet Place 1 tablet (0.4 mg total) under the tongue every 5 (five) minutes as needed for chest pain. 25 tablet 3   pantoprazole (PROTONIX) 40 MG tablet Take 40 mg by mouth daily.     sodium chloride (OCEAN) 0.65 % SOLN nasal spray Place 1 spray into both nostrils daily. 15 mL 3   No current facility-administered medications on file prior to visit.    Allergies  Allergen Reactions   Chlorhexidine Gluconate Itching   Iodinated Diagnostic Agents     Not really sure of the incident// patient denies allergy and has had catherterization without incident.    Past Medical  History:  Diagnosis Date   Arthritis    "all the joints" (02/06/2016)   Coronary artery disease    a. PCI with DES to mid LAD 7/17 b.post PTCA and stenting of his left circumflex artery in 12/08   Enlarged prostate    GERD (gastroesophageal reflux disease)    Hyperlipidemia    Hypertension    Ischemia    Pulmonary fibrosis, unspecified (HCC)    Shortness of breath    Skin disorder    Occurs at times as rash, whelps, blister-like areas  with itching sometimes-has occurred for years , but can not find an exact dx or cause    Past Surgical History:  Procedure Laterality Date   CARDIAC CATHETERIZATION N/A 02/06/2016   Procedure: Right/Left Heart Cath and Coronary Angiography;  Surgeon: Tonny Bollman, MD;  Location: [facility] INVASIVE CV LAB;  Service: Cardiovascular;  Laterality: N/A;   CATARACT EXTRACTION, BILATERAL Bilateral 2016   CORONARY ANGIOPLASTY WITH STENT PLACEMENT  06/2007   Est. EF of 55-60%PTCA and stenting of his left circumflex artery in 12/08   EYE SURGERY     INGUINAL HERNIA REPAIR Left    JOINT REPLACEMENT     KNEE ARTHROSCOPY  1990s   ORBITAL FRACTURE SURGERY Left 1957   TONSILLECTOMY     TOTAL KNEE ARTHROPLASTY Right 05/31/2014   Procedure: RIGHT TOTAL KNEE ARTHROPLASTY;  Surgeon: Loanne Drilling, MD;  Location: [facility] ORS;  Service: Orthopedics;  Laterality: Right;    Social History   Tobacco Use  Smoking Status Never  Smokeless Tobacco Never    Social History   Substance and Sexual Activity  Alcohol Use Yes   Comment: 02/06/2016 "might have a glass of beer or wine once/month"    Family History  Problem Relation Age of Onset   Heart failure Mother        possible   Heart disease Mother    Heart disease Father    Coronary artery disease Brother    Heart disease Brother     Reviw of Systems:  Reviewed in the HPI.  All other systems are negative.   Physical Exam: There were no vitals taken for this visit.  GEN:  Well nourished, well developed in no  acute distress HEENT: Normal NECK: No JVD; No carotid bruits LYMPHATICS: No lymphadenopathy CARDIAC: RRR , no murmurs, rubs, gallops RESPIRATORY:  fibrotic breath sounds in bases.  ABDOMEN: Soft, non-tender, non-distended MUSCULOSKELETAL:  No edema; No deformity  SKIN: Warm and dry NEUROLOGIC:  Alert and oriented x 3     ECG: Aug.. 1, 2022:  NSR -,  frequent PVCs  no ST or T wave changes    Assessment / Plan:    1. Coronary artery disease:  no angina . Seem to be doing well .  Is not on Imdur any longer    2. Hypertension -  BP is well controlled.    3. Hyperlipidemia -  lipids check today    4. BPH Continue Cardura.  5.  Pulmonary fibrosis:   stable,  is chronically short of breath    Kristeen Miss, MD   02/05/2021 8:09 PM    [facility] [address] [place],  [address] [place], [place]  [address] Pager [phone] Phone: [phone]; Fax: [phone]

## 2021-02-06 ENCOUNTER — Other Ambulatory Visit: Payer: Self-pay

## 2021-02-06 ENCOUNTER — Ambulatory Visit: Payer: Medicare HMO | Admitting: Cardiovascular Disease

## 2021-02-06 VITALS — BP 122/52 | HR 73 | Ht 68.0 in | Wt 186.0 lb

## 2021-02-06 DIAGNOSIS — E7849 Other hyperlipidemia: Secondary | ICD-10-CM

## 2021-02-06 DIAGNOSIS — I2510 Atherosclerotic heart disease of native coronary artery without angina pectoris: Secondary | ICD-10-CM

## 2021-02-06 DIAGNOSIS — I10 Essential (primary) hypertension: Secondary | ICD-10-CM | POA: Diagnosis not present

## 2021-02-06 DIAGNOSIS — I25119 Atherosclerotic heart disease of native coronary artery with unspecified angina pectoris: Secondary | ICD-10-CM | POA: Diagnosis not present

## 2021-02-06 LAB — LIPID PANEL
Chol/HDL Ratio: 2.5 ratio (ref 0.0–5.0)
Cholesterol, Total: 123 mg/dL (ref 100–199)
HDL: 50 mg/dL (ref >39)
LDL Chol Calc (NIH): 50 mg/dL (ref 0–99)
Triglycerides: 133 mg/dL (ref 0–149)
VLDL Cholesterol Cal: 23 mg/dL (ref 5–40)

## 2021-02-06 LAB — BASIC METABOLIC PANEL
BUN/Creatinine Ratio: 11 (ref 10–24)
BUN: 16 mg/dL (ref 10–36)
CO2: 22 mmol/L (ref 20–29)
Calcium: 9.1 mg/dL (ref 8.6–10.2)
Chloride: 107 mmol/L — ABNORMAL HIGH (ref 96–106)
Creatinine, Ser: 1.49 mg/dL — ABNORMAL HIGH (ref 0.76–1.27)
Glucose: 112 mg/dL — ABNORMAL HIGH (ref 65–99)
Potassium: 4.1 mmol/L (ref 3.5–5.2)
Sodium: 142 mmol/L (ref 134–144)
eGFR: 44 mL/min/{1.73_m2} — ABNORMAL LOW (ref >59)

## 2021-02-06 LAB — ALT: ALT: 18 IU/L (ref 0–44)

## 2021-02-06 NOTE — Patient Instructions (Signed)
Medication Instructions:  Your physician recommends that you continue on your current medications as directed. Please refer to the Current Medication list given to you today.  *If you need a refill on your cardiac medications before your next appointment, please call your pharmacy*   Lab Work: TODAY: FLP, ALT, BMET If you have labs (blood work) drawn today and your tests are completely normal, you will receive your results only by: MyChart Message (if you have MyChart) OR A paper copy in the mail If you have any lab test that is abnormal or we need to change your treatment, we will call you to review the results.  Follow-Up: At [facility], you and your health needs are our priority.  As part of our continuing mission to provide you with exceptional heart care, we have created designated Provider Care Teams.  These Care Teams include your primary Cardiologist (physician) and Advanced Practice Providers (APPs -  Physician Assistants and Nurse Practitioners) who all work together to provide you with the care you need, when you need it.   Your next appointment:   1 year(s)  The format for your next appointment:   In Person  Provider:   You may see Philip Nahser, MD or one of the following Advanced Practice Providers on your designated Care Team:   [name], PA-C Vin Bhagat, PA-C   

## 2021-03-20 ENCOUNTER — Telehealth: Payer: Self-pay | Admitting: Pulmonary Disease

## 2021-03-20 NOTE — Telephone Encounter (Signed)
Called and spoke with patient who is calling stating that he saw advertisements for Paxlovid and wonders is RA thinks it would be good for him, and if it would could he has a presciption for it. Asked patient if he was currently positive for Covid and he stated no. He said that he has pulmonary fibrosis and saw that he would keep him out of the hospital so wanted to take it now so he doesn't get covid. Advised patient that it is a treatment for covid and that he has to test positive for covid to get it. Advised him that he would call us if he did test positive and we would let Dr. Vassie Loll know and go from there. Patient expressed understanding. Nothing further needed at this time.

## 2021-04-28 DIAGNOSIS — Z125 Encounter for screening for malignant neoplasm of prostate: Secondary | ICD-10-CM | POA: Diagnosis not present

## 2021-04-28 DIAGNOSIS — E785 Hyperlipidemia, unspecified: Secondary | ICD-10-CM | POA: Diagnosis not present

## 2021-04-28 DIAGNOSIS — E291 Testicular hypofunction: Secondary | ICD-10-CM | POA: Diagnosis not present

## 2021-05-05 DIAGNOSIS — I25119 Atherosclerotic heart disease of native coronary artery with unspecified angina pectoris: Secondary | ICD-10-CM | POA: Diagnosis not present

## 2021-05-05 DIAGNOSIS — E669 Obesity, unspecified: Secondary | ICD-10-CM | POA: Diagnosis not present

## 2021-05-05 DIAGNOSIS — K635 Polyp of colon: Secondary | ICD-10-CM | POA: Diagnosis not present

## 2021-05-05 DIAGNOSIS — J8410 Pulmonary fibrosis, unspecified: Secondary | ICD-10-CM | POA: Diagnosis not present

## 2021-05-05 DIAGNOSIS — E785 Hyperlipidemia, unspecified: Secondary | ICD-10-CM | POA: Diagnosis not present

## 2021-05-05 DIAGNOSIS — Z0000 Encounter for general adult medical examination without abnormal findings: Secondary | ICD-10-CM | POA: Diagnosis not present

## 2021-05-05 DIAGNOSIS — R82998 Other abnormal findings in urine: Secondary | ICD-10-CM | POA: Diagnosis not present

## 2021-05-05 DIAGNOSIS — D692 Other nonthrombocytopenic purpura: Secondary | ICD-10-CM | POA: Diagnosis not present

## 2021-05-05 DIAGNOSIS — K219 Gastro-esophageal reflux disease without esophagitis: Secondary | ICD-10-CM | POA: Diagnosis not present

## 2021-05-05 DIAGNOSIS — N183 Chronic kidney disease, stage 3 unspecified: Secondary | ICD-10-CM | POA: Diagnosis not present

## 2021-05-12 ENCOUNTER — Other Ambulatory Visit: Payer: Self-pay | Admitting: Cardiovascular Disease

## 2021-05-24 ENCOUNTER — Other Ambulatory Visit: Payer: Self-pay

## 2021-05-24 ENCOUNTER — Ambulatory Visit: Payer: Medicare HMO | Admitting: Pulmonary Disease

## 2021-05-24 ENCOUNTER — Encounter: Payer: Self-pay | Admitting: Pulmonary Disease

## 2021-05-24 DIAGNOSIS — J84112 Idiopathic pulmonary fibrosis: Secondary | ICD-10-CM

## 2021-05-24 NOTE — Progress Notes (Signed)
   Subjective:    Patient ID: Patrick Knapp, male    DOB: October 13, 1927, 87 y.o.   MRN: [id number]  HPI  87 yo never smoker For follow-up of  Idiopathic pulmonary fibrosis.   He started on OFEV 08/2017 for slight drop in lung function/FVC but this was stopped due to diarrhea in spite of decreasing the dose.  He resumed Esbriet 01/2019 and has tolerated this well. He had LFTs done with his PCP which were normal 2 weeks ago. He is able to go 20 minutes on a stationary bike. He really would like to avoid needleless testing  Overall dyspnea appears to be stable since his last visit He was evaluated for research study but felt not to be a candidate due to poor renal function although creatinine has been steady at 1.6 for many years   Significant tests/ events reviewed  HRCT 12/2020 definite UIP, minimal progression HRCT 08/2018 "probable" UIP, no honeycombing, unchanged from 2018   CT chest 2011 showed diffuse right greater than left peripheral and subpleural reticular fibrosis. Ambulatory O2 saturation on room air without desaturation 12/28/2016 ANA , CCP neg   HRCT 12/2016 fibrotic interstitial lung disease with mild honeycombing. Slight basilar predominance and slight asymmetrical involvement of the right lung with some progression since 2011.   PFTs 01/2017 no obs, FEV1 96%, FVC 76% DLCO was noted 57% in 04/2017.   Spirometry 06/2017  slight drop in FVC 68%  11/2017 FVC 65% 05/2018 FVC 49 %   Review of Systems neg for any significant sore throat, dysphagia, itching, sneezing, nasal congestion or excess/ purulent secretions, fever, chills, sweats, unintended wt loss, pleuritic or exertional cp, hempoptysis, orthopnea pnd or change in chronic leg swelling. Also denies presyncope, palpitations, heartburn, abdominal pain, nausea, vomiting, diarrhea or change in bowel or urinary habits, dysuria,hematuria, rash, arthralgias, visual complaints, headache, numbness weakness or ataxia.      Objective:   Physical Exam  Gen. Pleasant, elderly,well-nourished, in no distress ENT - no thrush, no pallor/icterus,no post nasal drip Neck: No JVD, no thyromegaly, no carotid bruits Lungs: no use of accessory muscles, no dullness to percussion, bibasal dry  rales no rhonchi  Cardiovascular: Rhythm irregular, heart sounds  normal, no murmurs or gallops, no peripheral edema Musculoskeletal: No deformities, no cyanosis or clubbing        Assessment & Plan:

## 2021-05-24 NOTE — Assessment & Plan Note (Signed)
Disease appears stable on CT with minimal progression compared to 2020.  He would like to avoid getting PFTs and other  Testing.  LFTs are okay by report I encouraged him to maintain an active lifestyle.  He seems to be tolerating pirfenidone well and we will continue at this time. And I discussed signs of disease progression including worsening shortness of breath or intractable cough

## 2021-05-24 NOTE — Patient Instructions (Signed)
Stay active.

## 2021-07-30 ENCOUNTER — Other Ambulatory Visit: Payer: Self-pay | Admitting: Pulmonary Disease

## 2021-07-30 DIAGNOSIS — J84112 Idiopathic pulmonary fibrosis: Secondary | ICD-10-CM

## 2021-08-08 ENCOUNTER — Telehealth: Payer: Self-pay | Admitting: Primary Care

## 2021-08-09 NOTE — Telephone Encounter (Signed)
Called and spoke with patient who states that he talked to Dr. Vassie Loll about doing a PT program at home and he was told that he would be getting a call from someone about it but that he has not heard anything about it.  Dr. Vassie Loll please advise

## 2021-09-01 ENCOUNTER — Telehealth: Payer: Self-pay

## 2021-09-01 NOTE — Telephone Encounter (Signed)
PA renewal for ESBRIET initiated automatically by CoverMyMeds. Deleted request in order to resubmit as generic.  Submitted a Prior Authorization request to [facility] for PIRFENIDONE via CoverMyMeds. Request was cancelled as authorization is already on file from 07/10/2021 to 07/09/2022. Nothing further needed at this time, however [facility] pharmacy may need to be informed at some point that they may fill medication as generic if they are not already doing so.   Key: [id number]

## 2021-09-04 ENCOUNTER — Telehealth: Payer: Self-pay | Admitting: Pulmonary Disease

## 2021-09-04 NOTE — Telephone Encounter (Signed)
Called [facility] but was on hold for over 10 mins. Name brand Patrick Knapp was sent to the pharmacy on 08/01/21.

## 2021-10-06 DIAGNOSIS — E785 Hyperlipidemia, unspecified: Secondary | ICD-10-CM | POA: Diagnosis not present

## 2021-10-06 DIAGNOSIS — K219 Gastro-esophageal reflux disease without esophagitis: Secondary | ICD-10-CM | POA: Diagnosis not present

## 2021-10-06 DIAGNOSIS — N183 Chronic kidney disease, stage 3 unspecified: Secondary | ICD-10-CM | POA: Diagnosis not present

## 2021-10-06 DIAGNOSIS — I25119 Atherosclerotic heart disease of native coronary artery with unspecified angina pectoris: Secondary | ICD-10-CM | POA: Diagnosis not present

## 2021-11-21 ENCOUNTER — Ambulatory Visit: Payer: Medicare HMO | Admitting: Primary Care

## 2021-11-21 ENCOUNTER — Telehealth: Payer: Self-pay | Admitting: Primary Care

## 2021-11-21 ENCOUNTER — Encounter: Payer: Self-pay | Admitting: Primary Care

## 2021-11-21 VITALS — BP 142/80 | HR 80 | Temp 97.5°F | Ht 67.5 in | Wt 179.8 lb

## 2021-11-21 DIAGNOSIS — J84112 Idiopathic pulmonary fibrosis: Secondary | ICD-10-CM

## 2021-11-21 LAB — CBC WITH DIFFERENTIAL/PLATELET
Basophils Absolute: 0.1 10*3/uL (ref 0.0–0.1)
Basophils Relative: 0.9 % (ref 0.0–3.0)
Eosinophils Absolute: 0.1 10*3/uL (ref 0.0–0.7)
Eosinophils Relative: 1.5 % (ref 0.0–5.0)
HCT: 45.3 % (ref 39.0–52.0)
Hemoglobin: 15 g/dL (ref 13.0–17.0)
Lymphocytes Relative: 14.3 % (ref 12.0–46.0)
Lymphs Abs: 1.2 10*3/uL (ref 0.7–4.0)
MCHC: 33.2 g/dL (ref 30.0–36.0)
MCV: 90.1 fl (ref 78.0–100.0)
Monocytes Absolute: 1.2 10*3/uL — ABNORMAL HIGH (ref 0.1–1.0)
Monocytes Relative: 14.2 % — ABNORMAL HIGH (ref 3.0–12.0)
Neutro Abs: 5.9 10*3/uL (ref 1.4–7.7)
Neutrophils Relative %: 69.1 % (ref 43.0–77.0)
Platelets: 142 10*3/uL — ABNORMAL LOW (ref 150.0–400.0)
RBC: 5.03 Mil/uL (ref 4.22–5.81)
RDW: 14.3 % (ref 11.5–15.5)
WBC: 8.5 10*3/uL (ref 4.0–10.5)

## 2021-11-21 LAB — COMPREHENSIVE METABOLIC PANEL
ALT: 22 U/L (ref 0–53)
AST: 25 U/L (ref 0–37)
Albumin: 4.1 g/dL (ref 3.5–5.2)
Alkaline Phosphatase: 93 U/L (ref 39–117)
BUN: 18 mg/dL (ref 6–23)
CO2: 26 mEq/L (ref 19–32)
Calcium: 9.8 mg/dL (ref 8.4–10.5)
Chloride: 105 mEq/L (ref 96–112)
Creatinine, Ser: 1.45 mg/dL (ref 0.40–1.50)
GFR: 42.16 mL/min — ABNORMAL LOW (ref >60.00)
Glucose, Bld: 99 mg/dL (ref 70–99)
Potassium: 4.1 mEq/L (ref 3.5–5.1)
Sodium: 139 mEq/L (ref 135–145)
Total Bilirubin: 0.5 mg/dL (ref 0.2–1.2)
Total Protein: 7.6 g/dL (ref 6.0–8.3)

## 2021-11-21 MED ORDER — PREDNISONE 10 MG PO TABS: ORAL_TABLET | ORAL | 0 refills | Status: DC

## 2021-11-21 NOTE — Assessment & Plan Note (Addendum)
-   IPF felt to be stable on CT imaging with minimal progression since 2020. Maintained on Esbriet 801mg  TID. Increased fatigue last several weeks. Chronic cough and dyspnea with exertion. He recovers quickly with rest. No oxygen requirements. Sending in prednisone taper for suspected ILD flare. Checking CBC with diff and CMET. Advised he resume daily stationary bike exercises. If fatigue does not improve with prednisone and exercise advised he notify . FU in 3 months with Dr. [place].  ?

## 2021-11-21 NOTE — Patient Instructions (Addendum)
Recommendations: ?Resume stationary bike exercise daily ?Take prednisone taper as directed ?Continue Esbriet  ? ?Orders: ?Labs today (I will call if anything abnormal from your baseline) ?Walk test today in office  ? ?Follow-up: ?3 months with Dr. Vassie Loll  ?

## 2021-11-21 NOTE — Telephone Encounter (Signed)
Beth was able to speak with daughter. Will route to her ?

## 2021-11-21 NOTE — Addendum Note (Signed)
Addended by: Glenford Bayley on: 11/21/2021 05:37 PM ? ? Modules accepted: Orders ? ?

## 2021-11-21 NOTE — Telephone Encounter (Signed)
Can we schedule patient for 6MWT. I also ordered ONO. Ok to call patient directly to set up  ?

## 2021-11-21 NOTE — Telephone Encounter (Signed)
Spoke with Daughter, she feels patient may need supplemental oxygen d/t worsening dyspnea symptoms and IPF. He did not require oxygen today during simple walk test. Recommend checking and ONO.  ?

## 2021-11-21 NOTE — Progress Notes (Addendum)
? ?@Patient  ID: Patrick Knapp, male    DOB: 1927-12-21, 88 y.o.   MRN: [id number] ? ?Chief Complaint  ?Patient presents with  ? Follow-up  ?  Fatigue increased   ? ? ?Referring provider: ?Chilton GreathouseAvva, Ravisankar, MD ? ?HPI: ?88 year old male, never smoked.  Past medical history significant for IPF, hypertension, GERD.  Patient of Dr. Vassie LollAlva, last seen on 09/15/20. He was started on OFEV in February 2019 for decrease in FVC. Dose decreased to 100mg  BID d/t diarrhea symptoms. Maintained Esbriet 801mg  daily, started 01/21/19. Tolerating well with no side effects.  ? ?Previous [facility] encounter ?01/16/2021 ?Patient presents today for 3-4 month follow-up. During his last visit with Dr. Vassie LollAlva, patient's IPF was felt to be clinically stable based on history and exam. He does not want further testing, Dr. Vassie LollAlva and the patient have elected to avoid CT scans and PFTs. Plan is to monitor clinically, periodically assess for oxygen desaturation and check LFTs for drug monitoring. He has completed pulmonary rehab in the past.  ? ?Breathing over it is ok. He gets out of breath with heavy lifting and with heat/humidity. He does not have a significant cough. Cough is productive with white mucus. He is tolerating Esbriet TID without any side effects. Denies weight loss, GI symptoms, nausea or vomiting.  ? ?He saw [facility] for initial pliant study visit 1, he had blood work with them and reports that he was disqualified d/t his kidney function. He also had CT chest with Dr. Efraim Kaufmannamsawamy which showed ILD consistently with UIP, minimal progression compared to prior exam.  ? ?05/24/21- Dr. Vassie LollAlva ?88 yo never smoker For follow-up of  Idiopathic pulmonary fibrosis. ?  ?He started on OFEV 08/2017 for slight drop in lung function/FVC but this was stopped due to diarrhea in spite of decreasing the dose.  He resumed Esbriet 01/2019 and has tolerated this well. ?He had LFTs done with his PCP which were normal 2 weeks ago. ?He is able to go 20  minutes on a stationary bike. ?He really would like to avoid needleless testing  ?Overall dyspnea appears to be stable since his last visit ?He was evaluated for research study but felt not to be a candidate due to poor renal function although creatinine has been steady at 1.6 for many years ? ? ?11/21/2021 - interim hx  ?Patient presents today for 4-6 month follow-up. Hx IPF, on Esbriet since July 2020. He was evaluated for research study but felt not to be a candidate d/t poor renal function. Disease appears stable on CT imaging with minimal progression compared to 2020. He had been feeling well until recently. He complains of worsening fatigue over the last couple weeks. He has a chronic cough with clear mucus production. He gets winded with exertion but recovered quickly with rest. He is not using stationary bike as frequently as he has previously been, states that he gets bored easily but is thinking about putting a TV in front of it. He has been walking 100 steps 10x a night. He is starting an in-home rehab program this Friday.  ? ?Dyspnea - with exertion but recovered quickly  ?Cough- persistent productive cough with clear mucus  ?Exercise- not using bike as consistently as he had been, walking 100 steps 10x a night  ? ? ?Allergies  ?Allergen Reactions  ? Chlorhexidine Gluconate Itching  ? Iodinated Contrast Media   ?  Not really sure of the incident// patient denies allergy and has had catherterization without incident.  ? ? ?  Immunization History  ?Administered Date(s) Administered  ? Influenza Split 03/09/2016  ? Influenza Whole 04/22/2017  ? Influenza, High Dose Seasonal PF 06/08/2009, 04/08/2016, 04/11/2017, 03/24/2018, 03/10/2019  ? Influenza-Unspecified 04/08/2005, 06/09/2007, 04/08/2008, 04/08/2010  ? PFIZER(Purple Top)SARS-COV-2 Vaccination 08/15/2019, 09/09/2019, 05/15/2020  ? Pneumococcal Conjugate-13 04/08/2016  ? Pneumococcal-Unspecified 07/09/2004  ? Tdap 07/09/2014  ? ? ?Past Medical History:   ?Diagnosis Date  ? Arthritis   ? "all the joints" (02/06/2016)  ? Coronary artery disease   ? a. PCI with DES to mid LAD 7/17 b.post PTCA and stenting of his left circumflex artery in 12/08  ? Enlarged prostate   ? GERD (gastroesophageal reflux disease)   ? Hyperlipidemia   ? Hypertension   ? Ischemia   ? Pulmonary fibrosis, unspecified (HCC)   ? Shortness of breath   ? Skin disorder   ? Occurs at times as rash, whelps, blister-like areas  with itching sometimes-has occurred for years , but can not find an exact dx or cause  ? ? ?Tobacco History: ?Social History  ? ?Tobacco Use  ?Smoking Status Never  ?Smokeless Tobacco Never  ? ?Counseling given: Not Answered ? ? ?Outpatient Medications Prior to Visit  ?Medication Sig Dispense Refill  ? acetaminophen (TYLENOL) 650 MG CR tablet Take 650 mg by mouth every 8 (eight) hours as needed for pain.    ? ALPRAZolam (XANAX) 0.5 MG tablet Take 0.5 mg by mouth 3 (three) times daily as needed. AS NEEDED FOR ANXIETY; taking at bedtime only for insomnia    ? amLODipine (NORVASC) 2.5 MG tablet TAKE 1 TABLET EVERY DAY 90 tablet 3  ? atorvastatin (LIPITOR) 20 MG tablet Take 20 mg by mouth daily.     ? cholecalciferol (VITAMIN D) 1000 units tablet Take 1,000 Units by mouth daily.    ? clopidogrel (PLAVIX) 75 MG tablet Take 1 tablet (75 mg total) by mouth daily. 30 tablet 11  ? ESBRIET 801 MG TABS TAKE 1 TABLET (801MG ) BY MOUTH IN THE MORNING, AT NOON AND AT BEDTIME 90 tablet 6  ? gabapentin (NEURONTIN) 400 MG capsule     ? Guaifenesin 1200 MG TB12 Take 1 tablet (1,200 mg total) by mouth 2 (two) times daily. 60 tablet 3  ? magnesium gluconate (MAGONATE) 500 MG tablet Take 500 mg by mouth daily.    ? nitroGLYCERIN (NITROSTAT) 0.4 MG SL tablet Place 1 tablet (0.4 mg total) under the tongue every 5 (five) minutes as needed for chest pain. 25 tablet 3  ? pantoprazole (PROTONIX) 40 MG tablet Take 40 mg by mouth daily.    ? benzonatate (TESSALON PERLES) 100 MG capsule Take 2 capsules (200 mg  total) by mouth every 8 (eight) hours as needed for cough. (Patient not taking: Reported on 11/21/2021) 90 capsule 3  ? doxazosin (CARDURA) 4 MG tablet Take 2 mg by mouth every morning.  (Patient not taking: Reported on 11/21/2021)    ? fluticasone (FLONASE) 50 MCG/ACT nasal spray Place 1 spray into both nostrils daily. (Patient not taking: Reported on 11/21/2021) 16 g 2  ? sodium chloride (OCEAN) 0.65 % SOLN nasal spray Place 1 spray into both nostrils daily. (Patient not taking: Reported on 11/21/2021) 15 mL 3  ? ?No facility-administered medications prior to visit.  ? ?Review of Systems ? ?Review of Systems  ?Constitutional:  Positive for fatigue.  ?HENT: Negative.    ?Respiratory:  Positive for cough and shortness of breath.   ? ? ?Physical Exam ? ?BP (!) 142/80 (BP Location: Right  Arm, Patient Position: Sitting, Cuff Size: Normal)   Pulse 80   Temp (!) 97.5 ?F (36.4 ?C) (Oral)   Ht 5' 7.5" (1.715 m)   Wt 179 lb 12.8 oz (81.6 kg)   SpO2 95%   BMI 27.75 kg/m?  ?Physical Exam ?Constitutional:   ?   Appearance: Normal appearance. He is not ill-appearing.  ?HENT:  ?   Head: Normocephalic and atraumatic.  ?   Mouth/Throat:  ?   Mouth: Mucous membranes are moist.  ?   Pharynx: Oropharynx is clear.  ?Cardiovascular:  ?   Rate and Rhythm: Normal rate and regular rhythm.  ?Pulmonary:  ?   Effort: Pulmonary effort is normal.  ?   Breath sounds: Rales present. No wheezing or rhonchi.  ?Musculoskeletal:     ?   General: Normal range of motion.  ?Skin: ?   General: Skin is warm and dry.  ?Neurological:  ?   General: No focal deficit present.  ?   Mental Status: He is alert and oriented to person, place, and time. Mental status is at baseline.  ?Psychiatric:     ?   Mood and Affect: Mood normal.     ?   Behavior: Behavior normal.     ?   Thought Content: Thought content normal.     ?   Judgment: Judgment normal.  ?  ? ?Lab Results: ? ?CBC ?   ?Component Value Date/Time  ? WBC 6.9 01/12/2019 1145  ? RBC 4.37 01/12/2019 1145   ? HGB 12.3 (L) 01/12/2019 1145  ? HCT 37.3 (L) 01/12/2019 1145  ? PLT 118.0 (L) 01/12/2019 1145  ? MCV 85.4 01/12/2019 1145  ? MCH 28.2 02/07/2016 0256  ? MCHC 33.0 01/12/2019 1145  ? RDW 15.1 01/12/2019 1145  ?

## 2021-11-22 NOTE — Telephone Encounter (Signed)
Verlon Au, please advise when you think we might be able to get pt scheduled for . ?

## 2021-11-22 NOTE — Addendum Note (Signed)
Addended by: Glenford Bayley on: 11/22/2021 08:40 AM ? ? Modules accepted: Orders ? ?

## 2021-11-26 NOTE — Progress Notes (Unsigned)
Office Visit    Patient Name: Patrick Knapp Date of Encounter: 11/26/2021  Primary Care Provider:  Prince Solian, MD Primary Cardiologist:  Mertie Moores, MD Primary Electrophysiologist: None  Chief Complaint    Patrick Knapp is a 88 y.o. male with PMH of CAD, HTN, hyperlipidemia, pulmonary fibrosis and BPH presents today for follow-up of coronary artery disease.  Past Medical History    Past Medical History:  Diagnosis Date   Arthritis    "all the joints" (02/06/2016)   Coronary artery disease    a. PCI with DES to mid LAD 7/17 b.post PTCA and stenting of his left circumflex artery in 12/08   Enlarged prostate    GERD (gastroesophageal reflux disease)    Hyperlipidemia    Hypertension    Ischemia    Pulmonary fibrosis, unspecified (HCC)    Shortness of breath    Skin disorder    Occurs at times as rash, whelps, blister-like areas  with itching sometimes-has occurred for years , but can not find an exact dx or cause   Past Surgical History:  Procedure Laterality Date   CARDIAC CATHETERIZATION N/A 02/06/2016   Procedure: Right/Left Heart Cath and Coronary Angiography;  Surgeon: Sherren Mocha, MD;  Location: [facility];  Service: Cardiovascular;  Laterality: N/A;   CATARACT EXTRACTION, BILATERAL Bilateral 2016   CORONARY ANGIOPLASTY WITH STENT PLACEMENT  06/2007   Est. EF of 55-60%PTCA and stenting of his left circumflex artery in 12/08   EYE SURGERY     INGUINAL HERNIA REPAIR Left    JOINT REPLACEMENT     KNEE ARTHROSCOPY  1990s   ORBITAL FRACTURE SURGERY Left 1957   TONSILLECTOMY     TOTAL KNEE ARTHROPLASTY Right 05/31/2014   Procedure: RIGHT TOTAL KNEE ARTHROPLASTY;  Surgeon: Gearlean Alf, MD;  Location: [facility];  Service: Orthopedics;  Laterality: Right;    Allergies  Allergies  Allergen Reactions   Chlorhexidine Gluconate Itching   Iodinated Contrast Media     Not really sure of the incident// patient denies allergy and has had  catherterization without incident.    History of Present Illness    Patrick Knapp has a PMH of CAD, HTN, hyperlipidemia, pulmonary fibrosis and BPH. In 05/2007 Myoview performed for SOB revealing inferior lateral infarct. Left heart cath and PCI to Lcx. In 2017 repeat stress Myoview for complaint of DOE and fatigue revealed apical defect low risk study.  Patient continued to have symptoms and underwent LHC on 01/2016 demonstrating patent stent in LCx 90% proximal LAD stenosis and 90% D1 stenosis. He underwent orbital atherectomy and Bio freedom DES research stent to the LAD. 2D echo completed on 12/2018 due to complaint of dyspnea on exertion. Completed and showed EF of 60-65%, no RWMA, mild/mod TV regurgitation.  Lexiscan Myoview completed as well on 12/2018 with no changes from previous study.  He was last seen by Dr. Acie Fredrickson on 02/2021.  During visit patient was no longer using Imdur with no complaints of angina.  His blood pressure was well controlled and pulmonary fibrosis was stable with chronic SOB.  Since last being seen in the office patient reports***.  Patient denies chest pain, palpitations, dyspnea, PND, orthopnea, nausea, vomiting, dizziness, syncope, edema, weight gain, or early satiety.     ***Notes: Anginal Symptoms Chronic dyspnea with idiopathic pulmonary Fibrosis   Home Medications    Current Outpatient Medications  Medication Sig Dispense Refill   acetaminophen (TYLENOL) 650 MG CR tablet Take 650  mg by mouth every 8 (eight) hours as needed for pain.     ALPRAZolam (XANAX) 0.5 MG tablet Take 0.5 mg by mouth 3 (three) times daily as needed. AS NEEDED FOR ANXIETY; taking at bedtime only for insomnia     amLODipine (NORVASC) 2.5 MG tablet TAKE 1 TABLET EVERY DAY 90 tablet 3   atorvastatin (LIPITOR) 20 MG tablet Take 20 mg by mouth daily.      benzonatate (TESSALON PERLES) 100 MG capsule Take 2 capsules (200 mg total) by mouth every 8 (eight) hours as needed for cough.  (Patient not taking: Reported on 11/21/2021) 90 capsule 3   cholecalciferol (VITAMIN D) 1000 units tablet Take 1,000 Units by mouth daily.     clopidogrel (PLAVIX) 75 MG tablet Take 1 tablet (75 mg total) by mouth daily. 30 tablet 11   doxazosin (CARDURA) 4 MG tablet Take 2 mg by mouth every morning.  (Patient not taking: Reported on 11/21/2021)     ESBRIET 801 MG TABS TAKE 1 TABLET (801MG ) BY MOUTH IN THE MORNING, AT NOON AND AT BEDTIME 90 tablet 6   fluticasone (FLONASE) 50 MCG/ACT nasal spray Place 1 spray into both nostrils daily. (Patient not taking: Reported on 11/21/2021) 16 g 2   gabapentin (NEURONTIN) 400 MG capsule      Guaifenesin 1200 MG TB12 Take 1 tablet (1,200 mg total) by mouth 2 (two) times daily. 60 tablet 3   magnesium gluconate (MAGONATE) 500 MG tablet Take 500 mg by mouth daily.     nitroGLYCERIN (NITROSTAT) 0.4 MG SL tablet Place 1 tablet (0.4 mg total) under the tongue every 5 (five) minutes as needed for chest pain. 25 tablet 3   pantoprazole (PROTONIX) 40 MG tablet Take 40 mg by mouth daily.     predniSONE (DELTASONE) 10 MG tablet 4 tabs for 2 days, then 3 tabs for 2 days, 2 tabs for 2 days, then 1 tab for 2 days, then stop 20 tablet 0   sodium chloride (OCEAN) 0.65 % SOLN nasal spray Place 1 spray into both nostrils daily. (Patient not taking: Reported on 11/21/2021) 15 mL 3   No current facility-administered medications for this visit.     Review of Systems  Please see the history of present illness.    (+)*** (+)***  All other systems reviewed and are otherwise negative except as noted above.  Physical Exam    Wt Readings from Last 3 Encounters:  11/21/21 179 lb 12.8 oz (81.6 kg)  05/24/21 179 lb 9.6 oz (81.5 kg)  02/06/21 186 lb (84.4 kg)   [id number] were no vitals filed for this visit.,There is no height or weight on file to calculate BMI.  Constitutional:      Appearance: Healthy appearance. Not in distress.  Neck:     Vascular: JVD normal.  Pulmonary:      Effort: Pulmonary effort is normal.     Breath sounds: No wheezing. No rales. Diminished in the bases Cardiovascular:     Normal rate. Regular rhythm. Normal S1. Normal S2.      Murmurs: There is no murmur.  Edema:    Peripheral edema absent.  Abdominal:     Palpations: Abdomen is soft non tender. There is no hepatomegaly.  Skin:    General: Skin is warm and dry.  Neurological:     General: No focal deficit present.     Mental Status: Alert and oriented to person, place and time.     Cranial Nerves: Cranial nerves are  intact.  EKG/LABS/Other Studies Reviewed    ECG personally reviewed by me today - ***  Risk Assessment/Calculations:   {Does this patient have ATRIAL FIBRILLATION?:[phone]}        Lab Results  Component Value Date   WBC 8.5 11/21/2021   HGB 15.0 11/21/2021   HCT 45.3 11/21/2021   MCV 90.1 11/21/2021   PLT 142.0 (L) 11/21/2021   Lab Results  Component Value Date   CREATININE 1.45 11/21/2021   BUN 18 11/21/2021   NA 139 11/21/2021   K 4.1 11/21/2021   CL 105 11/21/2021   CO2 26 11/21/2021   Lab Results  Component Value Date   ALT 22 11/21/2021   AST 25 11/21/2021   ALKPHOS 93 11/21/2021   BILITOT 0.5 11/21/2021   Lab Results  Component Value Date   CHOL 123 02/06/2021   HDL 50 02/06/2021   LDLCALC 50 02/06/2021   TRIG 133 02/06/2021   CHOLHDL 2.5 02/06/2021    No results found for: HGBA1C  Assessment & Plan    1.  Coronary artery disease:  2.  Hypertension:  3.  Hyperlipidemia:  4.  Pulmonary fibrosis:      Disposition: Follow-up with Mertie Moores, MD or APP in *** months {Are you ordering a CV Procedure (e.g. stress test, cath, DCCV, TEE, etc)?   Press F2        :[id number]   Medication Adjustments/Labs and Tests Ordered: Current medicines are reviewed at length with the patient today.  Concerns regarding medicines are outlined above.   Signed, Mable Fill, Marissa Nestle, NP 11/26/2021, 11:13 AM [place]

## 2021-11-28 ENCOUNTER — Telehealth: Payer: Self-pay | Admitting: Primary Care

## 2021-11-28 ENCOUNTER — Ambulatory Visit: Payer: Medicare HMO | Admitting: Nurse Practitioner

## 2021-11-28 ENCOUNTER — Encounter: Payer: Self-pay | Admitting: Physician Assistant

## 2021-11-28 VITALS — BP 100/64 | HR 75 | Ht 67.5 in | Wt 178.0 lb

## 2021-11-28 DIAGNOSIS — I071 Rheumatic tricuspid insufficiency: Secondary | ICD-10-CM

## 2021-11-28 DIAGNOSIS — E785 Hyperlipidemia, unspecified: Secondary | ICD-10-CM | POA: Diagnosis not present

## 2021-11-28 DIAGNOSIS — I2510 Atherosclerotic heart disease of native coronary artery without angina pectoris: Secondary | ICD-10-CM | POA: Diagnosis not present

## 2021-11-28 DIAGNOSIS — I10 Essential (primary) hypertension: Secondary | ICD-10-CM

## 2021-11-28 DIAGNOSIS — I25119 Atherosclerotic heart disease of native coronary artery with unspecified angina pectoris: Secondary | ICD-10-CM

## 2021-11-28 DIAGNOSIS — J84112 Idiopathic pulmonary fibrosis: Secondary | ICD-10-CM | POA: Diagnosis not present

## 2021-11-28 NOTE — Telephone Encounter (Signed)
Please see if the pt can do in the pm on 6/2 or 6/3

## 2021-11-28 NOTE — Telephone Encounter (Signed)
Called daughter to inform her that her dad has a 6 minute walk scheduled   12/08/2021 2pm  [name] the daughter is wanting to know if Waynetta Sandy could look at the notes from cardiology on 11/28/21 because they walked him in office she believes.   Beth please advise on cardiology note from today when you have a chance

## 2021-11-28 NOTE — Telephone Encounter (Signed)
Unless he qualified for O2 during that visit (which I do not see mentioned) I would recommend cause they wanted to assess for o2 need. 6 minute walk is more comprehensive to determine this.

## 2021-11-28 NOTE — Patient Instructions (Addendum)
Medication Instructions:  Your physician recommends that you continue on your current medications as directed. Please refer to the Current Medication list given to you today. *If you need a refill on your cardiac medications before your next appointment, please call your pharmacy*   Lab Work: [facility] If you have labs (blood work) drawn today and your tests are completely normal, you will receive your results only by: MyChart Message (if you have MyChart) OR A paper copy in the mail If you have any lab test that is abnormal or we need to change your treatment, we will call you to review the results.   Testing/Procedures: NONE ORDERED   Follow-Up: At [facility], you and your health needs are our priority.  As part of our continuing mission to provide you with exceptional heart care, we have created designated Provider Care Teams.  These Care Teams include your primary Cardiologist (physician) and Advanced Practice Providers (APPs -  Physician Assistants and Nurse Practitioners) who all work together to provide you with the care you need, when you need it.  We recommend signing up for the patient portal called "MyChart".  Sign up information is provided on this After Visit Summary.  MyChart is used to connect with patients for Virtual Visits (Telemedicine).  Patients are able to view lab/test results, encounter notes, upcoming appointments, etc.  Non-urgent messages can be sent to your provider as well.   To learn more about what you can do with MyChart, go to [email or web address].    Your next appointment:   6 month(s)  The format for your next appointment:   In Person  Provider:   Kristeen Miss, MD or APP    Other Instructions CHECK YOUR BLOOD PRESSURE IN THE MORNINGS BEFORE MEDICATION AND AT LUNCH AFTER MEDICATION. RECORD THOSE READINGS. CONTACT THE OFFICE WITH THOSE READINGS   Important Information About Sugar

## 2021-12-08 ENCOUNTER — Ambulatory Visit: Payer: Medicare HMO

## 2022-01-03 NOTE — Telephone Encounter (Signed)
Noted.  Will close encounter.  

## 2022-01-03 NOTE — Telephone Encounter (Signed)
Can we try to get the 18mw rescheduled.

## 2022-01-03 NOTE — Telephone Encounter (Signed)
Patient scheduled 6 minute walk on 01/11/2022 at 9:30 am.

## 2022-01-04 ENCOUNTER — Telehealth: Payer: Self-pay | Admitting: Pulmonary Disease

## 2022-01-04 NOTE — Telephone Encounter (Signed)
After reviewing this, the pt needs ov with provider for this walk. We are no longer doing "six minute walk" to qualify someone for o2. He can keep appt with Vassie Loll and walk at that visit, or schedule sooner one if he wants to with APP.

## 2022-01-05 NOTE — Telephone Encounter (Signed)
Patient scheduled 03/07/2022 w/ Dr. Vassie Loll.

## 2022-01-11 ENCOUNTER — Ambulatory Visit: Payer: Medicare HMO

## 2022-01-29 DIAGNOSIS — H6123 Impacted cerumen, bilateral: Secondary | ICD-10-CM | POA: Diagnosis not present

## 2022-02-02 ENCOUNTER — Telehealth: Payer: Self-pay | Admitting: Pulmonary Disease

## 2022-02-02 NOTE — Telephone Encounter (Signed)
Left message for patient to call back  

## 2022-02-09 NOTE — Telephone Encounter (Signed)
Called and spoke to pt. Pt states he is unsure when he should have a repeat LFT. Per pt's chart pt had a CMET on 5/16 and it was not advised to have a repeat. Pt's liver enzymes were WNL. Pt has an OV on 8/30, pt will wait until appt to see if labs need to be taken then. Nothing further needed at this time.

## 2022-03-02 ENCOUNTER — Telehealth: Payer: Self-pay | Admitting: Pulmonary Disease

## 2022-03-06 NOTE — Telephone Encounter (Signed)
I have not been able to find the disability form anywhere in the [place] office - I've left a voice message for Mrs. Yassin asking her to call me back so we can get another copy of the form.

## 2022-03-07 ENCOUNTER — Ambulatory Visit: Payer: Medicare HMO | Admitting: Pulmonary Disease

## 2022-03-07 ENCOUNTER — Encounter: Payer: Self-pay | Admitting: Pulmonary Disease

## 2022-03-07 VITALS — BP 134/78 | HR 87 | Temp 97.6°F | Ht 68.0 in | Wt 177.6 lb

## 2022-03-07 DIAGNOSIS — J84112 Idiopathic pulmonary fibrosis: Secondary | ICD-10-CM

## 2022-03-07 LAB — HEPATIC FUNCTION PANEL
ALT: 18 U/L (ref 0–53)
AST: 23 U/L (ref 0–37)
Albumin: 4.1 g/dL (ref 3.5–5.2)
Alkaline Phosphatase: 76 U/L (ref 39–117)
Bilirubin, Direct: 0.2 mg/dL (ref 0.0–0.3)
Total Bilirubin: 0.7 mg/dL (ref 0.2–1.2)
Total Protein: 7.7 g/dL (ref 6.0–8.3)

## 2022-03-07 NOTE — Patient Instructions (Addendum)
  Fibrosis appears stable Call us if you feel worse. Stay active  LFTs today

## 2022-03-07 NOTE — Telephone Encounter (Signed)
Patient brought in [place] disability form when he came for his appointment today.  I added Dr. Reginia Naas name and practice information and sent back to clinic to be signed during the appointment.  Patient will take the form with him.

## 2022-03-07 NOTE — Progress Notes (Signed)
   Subjective:    Patient ID: Patrick Knapp, male    DOB: Dec 22, 1927, 88 y.o.   MRN: [id number]  HPI  88 yo never smoker For follow-up of  Idiopathic pulmonary fibrosis.   He started on OFEV 08/2017 for slight drop in lung function/FVC but this was stopped due to diarrhea in spite of decreasing the dose.  He resumed Esbriet 01/2019 and has tolerated this well.  PMH - CKD creat 1.6  Last seen by BW in 11/2021 given a prednisone taper for cough. On ambulation saturation dropped to 90% He stays active at home , he participates in a virtual rehab program, walks 4000 steps daily and uses weights up to 8 pound dumbbells. Cough is controlled. Breathing is stable. He is tolerating Esbriet well, LFTs 11/2021 was normal   Significant tests/ events reviewed HRCT 12/2020 definite UIP, minimal progression HRCT 08/2018 "probable" UIP, no honeycombing, unchanged from 2018   CT chest 2011 showed diffuse right greater than left peripheral and subpleural reticular fibrosis. Ambulatory O2 saturation on room air without desaturation 12/28/2016 ANA , CCP neg   HRCT 12/2016 fibrotic interstitial lung disease with mild honeycombing. Slight basilar predominance and slight asymmetrical involvement of the right lung with some progression since 2011.   PFTs 01/2017 no obs, FEV1 96%, FVC 76% DLCO was noted 57% in 04/2017.   Spirometry 06/2017  slight drop in FVC 68%  11/2017 FVC 65% 05/2018 FVC 49 %   Review of Systems neg for any significant sore throat, dysphagia, itching, sneezing, nasal congestion or excess/ purulent secretions, fever, chills, sweats, unintended wt loss, pleuritic or exertional cp, hempoptysis, orthopnea pnd or change in chronic leg swelling. Also denies presyncope, palpitations, heartburn, abdominal pain, nausea, vomiting, diarrhea or change in bowel or urinary habits, dysuria,hematuria, rash, arthralgias, visual complaints, headache, numbness weakness or ataxia.     Objective:    Physical Exam   Gen. Pleasant, well-nourished, in no distress ENT - no thrush, no pallor/icterus,no post nasal drip Neck: No JVD, no thyromegaly, no carotid bruits Lungs: no use of accessory muscles, no dullness to percussion, bibsal 1/3 dry rales Cardiovascular: Rhythm regular, heart sounds  normal, no murmurs or gallops, no peripheral edema Musculoskeletal: No deformities, no cyanosis or clubbing         Assessment & Plan:

## 2022-03-07 NOTE — Assessment & Plan Note (Signed)
Disease appears stable by history.  We will hold off further testing including CT or PFTs. We will monitor his ambulatory saturation on a 6 monthly basis. We will recheck LFTs today. I encouraged him to stay active in the virtual rehab program and keep up with his steps per day. I filled out his disability form

## 2022-03-15 ENCOUNTER — Other Ambulatory Visit: Payer: Medicare HMO | Admitting: Family Medicine

## 2022-03-15 ENCOUNTER — Encounter: Payer: Self-pay | Admitting: Family Medicine

## 2022-03-15 VITALS — BP 130/62 | HR 69 | Resp 22 | Wt 176.0 lb

## 2022-03-15 DIAGNOSIS — J84112 Idiopathic pulmonary fibrosis: Secondary | ICD-10-CM

## 2022-03-15 NOTE — Progress Notes (Unsigned)
[facility] Consult Note Telephone: [phone]  Fax: [phone]    Date of encounter: 03/15/22 11:24 AM PATIENT NAME: Patrick Knapp [address] [place] [address]   [phone] (home)  DOB: Feb 09, 1928 MRN: [id number] PRIMARY CARE PROVIDER:    Prince Solian, MD,  [place] [address] [phone]  REFERRING PROVIDER:   Prince Knapp, [place],   [id number] [phone]  RESPONSIBLE PARTY:    Contact Information     Name Relation Home Work Mobile   Patrick Knapp Daughter   [phone]   Patrick Knapp [phone]  [phone]        I met face to face with patient in his home. Palliative Care was asked to follow this patient by consultation request of  Avva, Ravisankar, MD to address advance care planning and complex medical decision making. This is a follow up visit   ASSESSMENT , SYMPTOM MANAGEMENT AND PLAN / RECOMMENDATIONS:   IPF Agree with continuing trial home pulmonary rehab program Encouraged to discuss flu shot with PCP/Pulmonologist at follow up visit Avoid crowds  Continue Esbriet Continue Boost supplement BID to TID to maintain nutrition   Advance Care Planning/Goals of Care: Goals include to maximize quality of life and symptom management.  Exploration of goals of care in the event of a sudden injury or illness-has MOST Identification of a healthcare agent-spouse, Patrick Knapp  Review of an advance directive document-MOST . Decision not to resuscitate or to de-escalate disease focused treatments due to poor prognosis. CODE STATUS: MOST as of 04/24/2019: DNR/DNI with limited additional intervention Use of antibiotics and IV fluids if indicated No feeding tube.      Follow up Palliative Care Visit: Palliative care will continue to follow for complex medical decision making, advance care planning, and clarification of goals.  Return 4 weeks or prn.    This visit was coded based on medical decision making (MDM).  PPS: 70%  HOSPICE ELIGIBILITY/DIAGNOSIS: TBD  Chief Complaint:  Palliative Care is following pt for chronic medical management in setting of idiopathic pulmonary fibrosis.  HISTORY OF PRESENT ILLNESS:  Patrick Knapp, goes by "Patrick Knapp" and is a 88 y.o. year Knapp male with idiopathic pulmonary fibrosis now on Esbriet and perceives that his health has improved and his immune system had gotten stronger since on Esbriet.  Previously he was on Ofev but had persistent diarrhea.  He got concerned when he recently had decrease in appetite and weight loss to 176 so he started drinking chocolate Boost.  Denies CP, SOB above baseline.  Has DOE.  Breathing worsens when below 50 degrees and above 80 degrees. Denies falls. Denies dysuria, LUTS, constipation. He is doing home experimental home pulmonary rehab program through [facility].  Since using pedometer he has increased steps from 2000 to 4000 daily. He is independent with bathing and dressing.  Has trouble chewing tough meats, primarily has soft foods.  Trouble sleeping. States his wife does wound care and is usually off on Fridays.  History obtained from review of EMR, discussion with Patrick Knapp.   03/07/22 Liver function panel unremarkable 11/21/21 CMP remarkable for CR 1.45 and eGFR 42.16. CBC remarkable only for mildly low PLT 142.0  11/28/21 SR with occasional PVC, IRBBB  12/27/20 CT chest high resolution IMPRESSION: 1. The appearance of the lungs is compatible with interstitial lung disease, categorized as compatible with usual interstitial pneumonia (UIP) per current ATS guidelines. Minimal progression compared to the prior examination. 2.  Aortic atherosclerosis, in addition to left main and 3 vessel coronary artery disease. 3. There are calcifications of the aortic valve. Echocardiographic correlation for evaluation of potential valvular dysfunction  may be warranted if clinically indicated.   Aortic Atherosclerosis (ICD10-I70.0).   I reviewed EMR for available labs, medications, imaging, studies and related documents. Records reviewed and summarized above.   ROS General: NAD ENMT: denies dysphagia Cardiovascular: denies chest pain, denies DOE Pulmonary: denies cough, denies increased SOB above baseline Abdomen: endorses good appetite, denies constipation, endorses continence of bowel GU: denies dysuria, endorses continence of urine MSK:  denies increased weakness, no falls reported Skin: denies rashes or wounds Neurological: denies pain, denies insomnia Psych: Endorses positive mood Heme/lymph/immuno: denies bruises, abnormal bleeding  Physical Exam: Current and past weights: today's weight 179.6 oz Constitutional: NAD General: WN, WD CV: S1S2, IRIR with LUSB murmur, no LE edema Pulmonary: CTAB except bibasilar coarse rales, no increased work of breathing, no cough, room air Abdomen: normo-active BS + 4 quadrants, soft and non tender, GU: deferred MSK: no sarcopenia, moves all extremities, ambulatory Skin: warm and dry, no rashes or wounds on visible skin Neuro:  no generalized weakness,  no cognitive impairment Psych: non-anxious affect, A and O x 3 Hem/lymph/immuno: no widespread bruising   Thank you for the opportunity to participate in the care of Patrick Knapp.  The palliative care team will continue to follow. Please call our office at [phone] if we can be of additional assistance.   Marijo Conception, FNP -C  COVID-19 PATIENT SCREENING TOOL Asked and negative response unless otherwise noted:   Have you had symptoms of covid, tested positive or been in contact with someone with symptoms/positive test in the past 5-10 days?  no

## 2022-03-18 ENCOUNTER — Encounter: Payer: Self-pay | Admitting: Family Medicine

## 2022-04-07 ENCOUNTER — Other Ambulatory Visit: Payer: Self-pay | Admitting: Pulmonary Disease

## 2022-04-07 DIAGNOSIS — J84112 Idiopathic pulmonary fibrosis: Secondary | ICD-10-CM

## 2022-04-27 ENCOUNTER — Other Ambulatory Visit: Payer: Medicare HMO | Admitting: Family Medicine

## 2022-04-27 DIAGNOSIS — L989 Disorder of the skin and subcutaneous tissue, unspecified: Secondary | ICD-10-CM | POA: Diagnosis not present

## 2022-04-27 DIAGNOSIS — L98491 Non-pressure chronic ulcer of skin of other sites limited to breakdown of skin: Secondary | ICD-10-CM | POA: Diagnosis not present

## 2022-05-03 ENCOUNTER — Other Ambulatory Visit: Payer: Medicare HMO | Admitting: Family Medicine

## 2022-05-03 VITALS — BP 130/60 | HR 64 | Wt 179.0 lb

## 2022-05-03 DIAGNOSIS — J84112 Idiopathic pulmonary fibrosis: Secondary | ICD-10-CM

## 2022-05-03 DIAGNOSIS — Z87898 Personal history of other specified conditions: Secondary | ICD-10-CM

## 2022-05-03 NOTE — Progress Notes (Signed)
[facility] Consult Note Telephone: [phone]  Fax: [phone]    Date of encounter: 05/03/22 9:18 AM PATIENT NAME: Patrick Knapp [address] [place] [address]   [phone] (home)  DOB: May 16, 1928 MRN: [id number] PRIMARY CARE PROVIDER:    Prince Solian, MD,  [place] [address] [phone]  REFERRING PROVIDER:   Prince Solian, [place],   [address] [phone]  RESPONSIBLE PARTY:    Contact Information     Name Relation Home Work Mobile   Nash,Linda Daughter   [phone]   Joah, Patlan [phone]  [phone]        I met face to face with patient and wife, Patrick Knapp, in their home. Palliative Care was asked to follow this patient by consultation request of  Avva, Ravisankar, MD to address advance care planning and complex medical decision making. This is a follow up visit   ASSESSMENT , SYMPTOM MANAGEMENT AND PLAN / RECOMMENDATIONS:  Idiopathic pulmonary fibrosis Educated on portable Inogen O2 concentrator and portable Inogen O2 to know (wife had someone give the equipment to them) battery charge, how to change the battery and set the O2 level. Advised pt that with O2 concentrator, having it plugged in will ensure that it is working and he should be able to hear the O2 movement. Continue home pulmonary rehab program.  Hx of unintentional weight loss Weight loss of 3-4 lbs Continue Boost supplement daily   Advance Care Planning/Goals of Care: Goals include to maximize quality of life and symptom management.  Identification of a healthcare agent-spouse Patrick Knapp Review of an advance directive document-MOST Decision not to resuscitate or to de-escalate disease focused treatments due to poor prognosis. CODE STATUS: MOST as of 04/24/2019: DNR/DNI with limited additional intervention Use of antibiotics and IV fluids if indicated No  feeding tube.      Follow up Palliative Care Visit: Palliative care will continue to follow for complex medical decision making, advance care planning, and clarification of goals. Return 4 weeks or prn.   This visit was coded based on medical decision making (MDM).  PPS: 60%  HOSPICE ELIGIBILITY/DIAGNOSIS: TBD  Chief Complaint:  Palliative Care is continuing to follow patient for chronic medical management in setting of and to idiopathic pulmonary fibrosis.  HISTORY OF PRESENT ILLNESS: Pt noted  Patrick Knapp is a 88 y.o. year old male with idiopathic pulmonary fibrosis, CAD, Hypertension, GERD and hyperlipemia.  Pt saw he was losing weight and got started on Boost supplement every so often with weight stabilizing at 179 lbs.  Walking about 4000 steps daily.  Has some SOB with exertion but resolves quickly on sitting down.  Scratched a mole recently and it bled for 24 hours, had to go in and have it cauterized.  Pt is on Plavix which could contribute to prolonged bleeding. No CP, falls, nausea/vomiting, constipation.  History obtained from review of EMR, discussion with wife, Patrick Knapp and/or Mr. Ammon.   I reviewed EMR for available labs, medications, imaging, studies and related documents.  There are no new records since last visit.   ROS General: NAD EYES: denies vision changes ENMT: denies dysphagia Cardiovascular: denies chest pain, endorses DOE Pulmonary: denies cough, denies increased SOB Abdomen: endorses good appetite, denies constipation, endorses continence of bowel GU: denies dysuria, endorses continence of urine MSK:  denies increased weakness, no falls reported Skin: denies rashes or wounds Neurological: denies pain, denies insomnia Psych: Endorses positive mood and increased anxiety  over oxygenation Heme/lymph/immuno: denies bruises, abnormal bleeding  Physical Exam: Current and past weights:  179 lbs today, 178 lbs as of 11/28/21 Constitutional:  NAD General: WNWD CV: S1S2, IRIR, no LE edema Pulmonary: CTAB, no increased work of breathing, no cough, room air Abdomen: normo-active BS + 4 quadrants, soft and non tender MSK: no sarcopenia, moves all extremities, ambulatory Skin: warm and dry, no rashes or wounds on visible skin Neuro:  no generalized weakness,  no cognitive impairment Psych: mod anxious affect, A and O x 3 Hem/lymph/immuno: no widespread bruising   Thank you for the opportunity to participate in the care of Mr. Hymon.  The palliative care team will continue to follow. Please call our office at [phone] if we can be of additional assistance.   Marijo Conception, FNP -C  COVID-19 PATIENT SCREENING TOOL Asked and negative response unless otherwise noted:   Have you had symptoms of covid, tested positive or been in contact with someone with symptoms/positive test in the past 5-10 days?  no

## 2022-05-06 ENCOUNTER — Encounter: Payer: Self-pay | Admitting: Family Medicine

## 2022-05-06 DIAGNOSIS — Z87898 Personal history of other specified conditions: Secondary | ICD-10-CM | POA: Insufficient documentation

## 2022-05-08 DIAGNOSIS — E291 Testicular hypofunction: Secondary | ICD-10-CM | POA: Diagnosis not present

## 2022-05-08 DIAGNOSIS — E785 Hyperlipidemia, unspecified: Secondary | ICD-10-CM | POA: Diagnosis not present

## 2022-05-08 DIAGNOSIS — Z125 Encounter for screening for malignant neoplasm of prostate: Secondary | ICD-10-CM | POA: Diagnosis not present

## 2022-05-08 DIAGNOSIS — R7989 Other specified abnormal findings of blood chemistry: Secondary | ICD-10-CM | POA: Diagnosis not present

## 2022-05-18 DIAGNOSIS — N183 Chronic kidney disease, stage 3 unspecified: Secondary | ICD-10-CM | POA: Diagnosis not present

## 2022-05-18 DIAGNOSIS — R82998 Other abnormal findings in urine: Secondary | ICD-10-CM | POA: Diagnosis not present

## 2022-05-18 DIAGNOSIS — E669 Obesity, unspecified: Secondary | ICD-10-CM | POA: Diagnosis not present

## 2022-05-18 DIAGNOSIS — Z0000 Encounter for general adult medical examination without abnormal findings: Secondary | ICD-10-CM | POA: Diagnosis not present

## 2022-05-18 DIAGNOSIS — J8410 Pulmonary fibrosis, unspecified: Secondary | ICD-10-CM | POA: Diagnosis not present

## 2022-05-18 DIAGNOSIS — N400 Enlarged prostate without lower urinary tract symptoms: Secondary | ICD-10-CM | POA: Diagnosis not present

## 2022-05-18 DIAGNOSIS — Z23 Encounter for immunization: Secondary | ICD-10-CM | POA: Diagnosis not present

## 2022-05-18 DIAGNOSIS — E785 Hyperlipidemia, unspecified: Secondary | ICD-10-CM | POA: Diagnosis not present

## 2022-05-18 DIAGNOSIS — I25119 Atherosclerotic heart disease of native coronary artery with unspecified angina pectoris: Secondary | ICD-10-CM | POA: Diagnosis not present

## 2022-05-18 DIAGNOSIS — G4700 Insomnia, unspecified: Secondary | ICD-10-CM | POA: Diagnosis not present

## 2022-05-18 DIAGNOSIS — D692 Other nonthrombocytopenic purpura: Secondary | ICD-10-CM | POA: Diagnosis not present

## 2022-05-21 ENCOUNTER — Other Ambulatory Visit: Payer: Self-pay | Admitting: Cardiovascular Disease

## 2022-05-24 ENCOUNTER — Encounter: Payer: Self-pay | Admitting: Family Medicine

## 2022-05-24 ENCOUNTER — Other Ambulatory Visit: Payer: Medicare HMO | Admitting: Family Medicine

## 2022-05-24 VITALS — BP 118/50 | HR 64 | Resp 24 | Wt 174.8 lb

## 2022-05-24 DIAGNOSIS — Z636 Dependent relative needing care at home: Secondary | ICD-10-CM

## 2022-05-24 DIAGNOSIS — Z87898 Personal history of other specified conditions: Secondary | ICD-10-CM

## 2022-05-24 DIAGNOSIS — J84112 Idiopathic pulmonary fibrosis: Secondary | ICD-10-CM

## 2022-05-24 NOTE — Progress Notes (Addendum)
[facility] Consult Note Telephone: [phone]  Fax: [phone]    Date of encounter: 05/24/22 10:34 AM PATIENT NAME: Patrick Knapp [address] [place] [address]   [phone] (home)  DOB: 02-26-1928 MRN: [id number] PRIMARY CARE PROVIDER:    Prince Solian, MD,  [place] [address] [phone]  REFERRING PROVIDER:   Prince Solian, [place],  [place] [address] [phone]  RESPONSIBLE PARTY:    Contact Information     Name Relation Home Work Mobile   Nash,Linda Daughter   [phone]   Jaelin, Fackler [phone]  [phone]        I met face to face with patient and wife, Patrick Knapp, in their home. Palliative Care was asked to follow this patient by consultation request of  Avva, Ravisankar, MD to address advance care planning and complex medical decision making. This is a follow up visit   ASSESSMENT , SYMPTOM MANAGEMENT AND PLAN / RECOMMENDATIONS:  Idiopathic pulmonary fibrosis Stable on Ezbriet Continues doing home interventional pulmonary rehab program study Continue focused breathing to count exhale to force longer exhale and distract pt from anxiety over breathing. Educated patient on changes to palliative program and given current stability we will look for additional options for chronic disease management.  Encourage if function significantly worsens or symptom burden becomes significantly higher to recontact for care collective for palliative care.  Hx of unintentional weight loss Weight loss due to recent dental surgery and pain with chewing Encouraged to do Boost supplement daily to BID while mouth healing  3.   Caregiver stress High anxiety state for patient recently with cataract surgery and questions improved then decrease in vision. Advised pt of reason behind waiting for recheck of vision after cataract surgery, role of removing the  cloudy lens to improve sight but then having inflammation of surgery change the vision with expected improvement over time. Encouraged pt in these situations if he is questioning wife's judgement of input to contact the performing provider to express concerns. Normalized for caregiver that many times the people closest to [place] who have some knowledge are ignored and encouraged her to have him check for himself. Normalized for pt that questioning every instruction from provider/caregiver leads to increased stress for caregiver. Advised pt that if he is concerned about a particular complication to call and ask because the provider will know if there is a problem what questions to ask. Would recommend whenever providers offering instruction to patient to be very concrete about anticipated results and normal findings along the route to healing.  Advance Care Planning/Goals of Care: Goals include to maximize quality of life and symptom management.  Identification of a healthcare agent-spouse Patrick Knapp Review of an advance directive document-MOST Decision not to resuscitate or to de-escalate disease focused treatments due to poor prognosis. CODE STATUS: MOST as of 04/24/2019: DNR/DNI with limited additional intervention Use of antibiotics and IV fluids if indicated No feeding tube.      Follow up Palliative Care Visit: Palliative care will continue to follow for complex medical decision making, advance care planning, and clarification of goals. Return 4 weeks or prn.   This visit was coded based on medical decision making (MDM).  PPS: 60%  HOSPICE ELIGIBILITY/DIAGNOSIS: TBD  Chief Complaint:  Palliative Care is continuing to follow patient for chronic medical management in setting of and to idiopathic pulmonary fibrosis.  HISTORY OF PRESENT ILLNESS: Pt noted  Patrick Knapp is a 88 y.o.  year old male with idiopathic pulmonary fibrosis, CAD, Hypertension, GERD and hyperlipemia. He has had  right cataract removed at [facility] and then recently had a tooth pulled.  He was told to stop exercising temporarily and has had decrease in appetite. No trouble with constipation and no falls. He has not been consistent with Boost supplement.  Has some SOB with exertion but resolves quickly on sitting down and notices less fatigue if he focuses on counting to his breath so his exhale is twice as long as his inhale.  No CP, falls, nausea/vomiting, constipation. States 3 days post op from cataract surgery he had clear vision in his operative eye then he began having more trouble with it and was very concerned he may have injured the lens.  Patient denies double vision, pain with eye movement.  Explained how binocular vision impacts ability to see clearly.  Educated patient that initially removing the clouded lens may have significantly improved his vision but after a couple of days the inflammation that takes place may alter the clarity of that vision and this is why he has to take so many drops after cataract surgery.  Encourage patient to always call and ask questions if he was uncertain but also to remember that his caregiver and provider have his best interest at heart.  Explained that if he does not have a baseline level of trust that it makes that relationship more difficult and also stressful for his caregiver.  Encourage caregiver to understand that often those closest to [place] have opinions that are not held as truth until validated.  History obtained from review of EMR, discussion with wife, Patrick Knapp and/or Patrick Knapp.   I reviewed EMR for available labs, medications, imaging, studies and related documents.  There are no new records since last visit.   ROS General: NAD EYES: denies vision changes ENMT: denies dysphagia Cardiovascular: denies chest pain, endorses DOE Pulmonary: denies cough, denies increased SOB Abdomen: endorses good appetite, denies constipation, endorses continence of bowel GU:  denies dysuria, endorses continence of urine MSK:  denies increased weakness, no falls reported Skin: denies rashes or wounds Neurological: denies pain, denies insomnia Psych: Endorses positive mood and increased anxiety over cataract surgery Heme/lymph/immuno: denies bruises, abnormal bleeding  Physical Exam: Current and past weights:  174.8 lbs today, 178 lbs as of 11/28/21 Constitutional: NAD General: WNWD CV: S1S2, IRIR, no LE edema Pulmonary: Fine crackles noted in BLL and RML, upper lungs clear, mildly tachypneic, no increased work of breathing, no cough, room air Abdomen: normo-active BS + 4 quadrants, soft and non tender MSK: no sarcopenia, moves all extremities, ambulatory Skin: warm and dry, no rashes or wounds on visible skin Neuro:  no generalized weakness,  no cognitive impairment Psych: non anxious affect, A and O x 3 Hem/lymph/immuno: no widespread bruising   Thank you for the opportunity to participate in the care of Mr. Portner.  The palliative care team will continue to follow. Please call our office at [phone] if we can be of additional assistance.   Marijo Conception, FNP -C  COVID-19 PATIENT SCREENING TOOL Asked and negative response unless otherwise noted:   Have you had symptoms of covid, tested positive or been in contact with someone with symptoms/positive test in the past 5-10 days?  no

## 2022-05-26 ENCOUNTER — Encounter: Payer: Self-pay | Admitting: Family Medicine

## 2022-05-26 DIAGNOSIS — Z636 Dependent relative needing care at home: Secondary | ICD-10-CM | POA: Insufficient documentation

## 2022-05-29 ENCOUNTER — Ambulatory Visit: Payer: Medicare HMO | Admitting: Cardiovascular Disease

## 2022-06-18 ENCOUNTER — Other Ambulatory Visit: Payer: Self-pay | Admitting: Cardiovascular Disease

## 2022-07-27 ENCOUNTER — Ambulatory Visit: Payer: Medicare HMO | Admitting: Cardiovascular Disease

## 2022-08-26 ENCOUNTER — Other Ambulatory Visit: Payer: Self-pay | Admitting: Cardiovascular Disease

## 2022-08-28 ENCOUNTER — Encounter (HOSPITAL_BASED_OUTPATIENT_CLINIC_OR_DEPARTMENT_OTHER): Payer: Self-pay | Admitting: Pulmonary Disease

## 2022-08-28 ENCOUNTER — Ambulatory Visit (INDEPENDENT_AMBULATORY_CARE_PROVIDER_SITE_OTHER): Payer: Medicare HMO | Admitting: Pulmonary Disease

## 2022-08-28 VITALS — BP 134/70 | HR 101 | Temp 98.1°F | Ht 67.5 in | Wt 176.8 lb

## 2022-08-28 DIAGNOSIS — J84112 Idiopathic pulmonary fibrosis: Secondary | ICD-10-CM | POA: Diagnosis not present

## 2022-08-28 NOTE — Progress Notes (Signed)
   Subjective:    Patient ID: Patrick Knapp, male    DOB: 17-Dec-1927, 89 y.o.   MRN: [id number]  HPI  89 yo never smoker For follow-up of  Idiopathic pulmonary fibrosis.   He started on OFEV 08/2017 for slight drop in lung function/FVC but this was stopped due to diarrhea in spite of decreasing the dose.  He resumed Esbriet 01/2019 and has tolerated this well.   PMH - CKD creat 1.6  22-monthfollow-up visit. Accompanied by his wife today.  He reports a sore throat, no fevers, voice is hoarse.  Breathing is okay.  He tried to take a COVID test but his "eyes are not good" and he could not read the results. He continues to stay active and has increased his step count about 4000 steps per day. He was undergoing home pulmonary rehab He denies symptoms of nausea, indigestion or diarrhea  Significant tests/ events reviewed   HRCT 12/2020 definite UIP, minimal progression HRCT 08/2018 "probable" UIP, no honeycombing, unchanged from 2018   CT chest 2011 showed diffuse right greater than left peripheral and subpleural reticular fibrosis. Ambulatory O2 saturation on room air without desaturation 12/28/2016 ANA , CCP neg   HRCT 12/2016 fibrotic interstitial lung disease with mild honeycombing. Slight basilar predominance and slight asymmetrical involvement of the right lung with some progression since 2011.   PFTs 01/2017 no obs, FEV1 96%, FVC 76% DLCO was noted 57% in 04/2017.   Spirometry 06/2017  slight drop in FVC 68%  11/2017 FVC 65% 05/2018 FVC 49 %  Review of Systems neg for any significant sore throat, dysphagia, itching, sneezing, nasal congestion or excess/ purulent secretions, fever, chills, sweats, unintended wt loss, pleuritic or exertional cp, hempoptysis, orthopnea pnd or change in chronic leg swelling. Also denies presyncope, palpitations, heartburn, abdominal pain, nausea, vomiting, diarrhea or change in bowel or urinary habits, dysuria,hematuria, rash, arthralgias, visual  complaints, headache, numbness weakness or ataxia.     Objective:   Physical Exam  Gen. Pleasant, well-nourished, in no distress ENT - no thrush, no pallor/icterus,no post nasal drip Neck: No JVD, no thyromegaly, no carotid bruits Lungs: no use of accessory muscles, no dullness to percussion, bibasal rales no rhonchi Cardiovascular: Rhythm regular, heart sounds  normal, no murmurs or gallops, no peripheral edema Musculoskeletal: No deformities, no cyanosis or clubbing        Assessment & Plan:

## 2022-08-28 NOTE — Patient Instructions (Signed)
X Amb sat  Continue on perfenidone  Call if color to phlegm or breathing worse

## 2022-08-28 NOTE — Assessment & Plan Note (Addendum)
Appears stable clinically. We will check LFTs, he seems to be tolerating pirfenidone well I encouraged him to stay active and keep up with his step count. He may be having a viral URI, no need for antibiotics currently. His wife will help him to do the COVID test at home  We have decided to do no more scans or PFTs and follow him clinically

## 2022-09-10 ENCOUNTER — Telehealth (HOSPITAL_BASED_OUTPATIENT_CLINIC_OR_DEPARTMENT_OTHER): Payer: Self-pay | Admitting: Pulmonary Disease

## 2022-09-10 ENCOUNTER — Encounter: Payer: Self-pay | Admitting: Cardiovascular Disease

## 2022-09-10 NOTE — Telephone Encounter (Signed)
Pt. Calling because Jimmey Ralph was going to send paperwork for pt. To get help with cost of medication ordo we have any samples in office

## 2022-09-10 NOTE — Progress Notes (Signed)
This encounter was created in error - please disregard.

## 2022-09-10 NOTE — Telephone Encounter (Signed)
Pt returning call. Told him to be on the lookout for [phone] number and pt states phone is fully charged.

## 2022-09-10 NOTE — Telephone Encounter (Signed)
Pt calling back to get Esbriet or Pirfenidone for sample.

## 2022-09-10 NOTE — Telephone Encounter (Signed)
Atc patient voice mail full, wanted to know what medication sample was needed

## 2022-09-10 NOTE — Telephone Encounter (Signed)
Talked to pt, patient assistant forms where not faxed to [facility] office from [place] for [place], sent message to [address] office to see if forms faxed there

## 2022-09-11 ENCOUNTER — Ambulatory Visit: Payer: Medicare HMO | Attending: Cardiovascular Disease | Admitting: Cardiovascular Disease

## 2022-09-11 NOTE — Telephone Encounter (Signed)
Pharmacy team has not received any paperwork regarding this pt's Esbriet. If it was in fact sent to the [address] office then it was likely placed into one of the provider's mailboxes (presumably [facility] or [place]) when it was initially received.  Will begin BIV in separate encounter to verify pt's eligibility for assistance through [place]. If paperwork is located then please ensure that provider signs their portion and then place into Pharmacy mailbox.

## 2022-09-12 ENCOUNTER — Telehealth: Payer: Self-pay | Admitting: Pulmonary Disease

## 2022-09-12 NOTE — Telephone Encounter (Signed)
We do not have samples as Esbriet is generic now and manufacturer no longer has samples to offer  Knox Saliva, PharmD, MPH, BCPS, CPP Clinical Pharmacist (Rheumatology and Pulmonology)

## 2022-09-12 NOTE — Telephone Encounter (Signed)
Pt wants to see if we having any samples of Esbriet, he only has a week supply left.

## 2022-09-12 NOTE — Telephone Encounter (Signed)
Pt calling back about his [facility] fax, informed Pt of last message sent. Informed him to have [facility] fax it over Again, provided pt w fax number.

## 2022-09-12 NOTE — Telephone Encounter (Signed)
I have located and printed patient assistance form online and will complete and fax to [address] office for Beth to sign

## 2022-09-12 NOTE — Telephone Encounter (Signed)
PT is 89 and hard of hearing.  States [facility] sent by fax two days ago  a simple 2 page form to Dr. Elsworth Soho to complete and send back so he can get fin assistance on his [place]. He only has a few days left of this med and can not afford on his own.  Pls call PT to advise. He heard Dr. Elsworth Soho is out of the office.   His # is [phone]

## 2022-09-18 ENCOUNTER — Other Ambulatory Visit: Payer: Self-pay | Admitting: Cardiovascular Disease

## 2022-09-19 NOTE — Telephone Encounter (Signed)
See telephone encounter from 09/10/22. Closing this encounter

## 2022-09-19 NOTE — Telephone Encounter (Signed)
Paperwork has not been received in [facility] location. Dr. Elsworth Soho should sign any pt assistance paperwork as he was last to see pt then paperwork should be faxed to [facility] as it is specialty drug. ATC to update voicemail box full.

## 2022-09-19 NOTE — Telephone Encounter (Signed)
Pt. Calling to make sure the form have got sent back to [facility] and please call pt. back

## 2022-09-20 NOTE — Telephone Encounter (Signed)
Sending to pharmacy team for assistance to see if we do need to print any forms to have filled out and if so, what do we need to do or is it the pt that is needing to fill out forms.

## 2022-09-21 ENCOUNTER — Telehealth: Payer: Self-pay | Admitting: Pulmonary Disease

## 2022-09-21 NOTE — Telephone Encounter (Signed)
Prescriber form filled out and signed by Dr. Elsworth Soho and faxed to [facility] location in [place]'s attn. Routing to pharmacy team as an FYI to be on the lookout for the form.

## 2022-09-25 NOTE — Telephone Encounter (Signed)
Pt called the office checking to see if the prescriber form for pirfenidone was sent back to [place]. Form was filled out and signed 3/15 by Dr. Elsworth Soho and then faxed over to [facility] location same day after it was filled out and signed.   Devki/Brighton, please advise if this was received and if it had been sent to [facility] for pt.   Pt is requesting a phone call ASAP as he is concerned about all this.

## 2022-09-25 NOTE — Telephone Encounter (Signed)
Provider forms received by pharmacy team. Thanks!  Knox Saliva, PharmD, MPH, BCPS, CPP Clinical Pharmacist (Rheumatology and Pulmonology)

## 2022-09-25 NOTE — Telephone Encounter (Signed)
This has been received by pharmacy team, thank you. Pt to stop by clinic tomorrow to further discuss  Knox Saliva, PharmD, MPH, BCPS, CPP Clinical Pharmacist (Rheumatology and Pulmonology)

## 2022-09-26 ENCOUNTER — Telehealth: Payer: Self-pay

## 2022-09-26 ENCOUNTER — Other Ambulatory Visit (HOSPITAL_COMMUNITY): Payer: Self-pay

## 2022-09-26 NOTE — Telephone Encounter (Signed)
Pt appears to have been previously receiving medication through a grant, however [employer] has been unable to find an open one for this year and has referred the pt to [facility] for assistance. Signed provider form was received by pharmacy team, however the pt portion was not completed. Spoke with pt on phone and he is coming to sign his portion today.  Pt has been historically receiving generic pirfenidone, will need to run BIV on name-brand Esbriet in the hopes of securing coverage through [employer].  Submitted a Prior Authorization request to [facility] for ESBRIET via CoverMyMeds. Will update once we receive a response.  Key:  [id number]

## 2022-09-28 ENCOUNTER — Other Ambulatory Visit (HOSPITAL_COMMUNITY): Payer: Self-pay

## 2022-09-28 NOTE — Telephone Encounter (Signed)
Received notification from [facility] regarding a prior authorization for ESBRIET. Authorization has been APPROVED from 07/09/2022 to 07/09/2023. Approval letter sent to scan center.  Per test claim, copay for 30 days supply is $1,460.63  Authorization # [id number]   Submitted Patient Assistance Application to [facility] for [place] along with provider portion and PA. Will update patient when we receive a response.  Phone #: [phone] Fax #: [phone]

## 2022-10-17 NOTE — Telephone Encounter (Signed)
Upon investigation of  [employer] Portal I learned that pt has been approved for ESBRIET patient assistance from 10/02/2022 until they no longer quailfy.  Approval letter downloaded and sent to scan center along with copy of application.    Phone #: [phone] Fax #: [phone]   Per portal, pt has already scheduled a shipment of medication as of 10/05/2022. Nothing further should be required at this time.

## 2022-11-07 ENCOUNTER — Telehealth: Payer: Self-pay | Admitting: Pulmonary Disease

## 2022-11-07 NOTE — Telephone Encounter (Signed)
Patient would like the nurse to call regarding some medication.  Please advise and call patient to discuss at [phone]

## 2022-11-15 NOTE — Telephone Encounter (Signed)
ATC patient. Will try to call again.

## 2022-11-21 NOTE — Telephone Encounter (Signed)
Called and spoke with patient. Patient stated he couldn't remember what his question was. Patient stated if he remembers anytime soon he will give [place] a call back.   Nothing further needed.

## 2023-01-04 ENCOUNTER — Telehealth: Payer: Self-pay | Admitting: Cardiovascular Disease

## 2023-01-04 MED ORDER — AMLODIPINE BESYLATE 2.5 MG PO TABS: 2.5000 mg | ORAL_TABLET | Freq: Every day | ORAL | 1 refills | Status: DC

## 2023-01-04 NOTE — Telephone Encounter (Signed)
Pt's medication was sent to pt's pharmacy as requested. Confirmation received.  °

## 2023-01-04 NOTE — Telephone Encounter (Signed)
*  STAT* If patient is at the pharmacy, call can be transferred to refill team.   1. Which medications need to be refilled? (please list name of each medication and dose if known) amLODipine (NORVASC) 2.5 MG tablet   2. Which pharmacy/location (including street and city if local pharmacy) is medication to be sent to? [facility] - [place], [place] - [address]   3. Do they need a 30 day or 90 day supply? 90 day   Pt has scheduled appt for 8/2

## 2023-02-07 NOTE — Progress Notes (Signed)
Office Visit    Patient Name: Patrick Knapp Date of Encounter: 02/07/2023  Primary Care Provider:  Chilton Greathouse, MD Primary Cardiologist:  Kristeen Miss, MD Primary Electrophysiologist: None   Past Medical History    Past Medical History:  Diagnosis Date   Arthritis    "all the joints" (02/06/2016)   Coronary artery disease    a. PCI with DES to mid LAD 7/17 b.post PTCA and stenting of his left circumflex artery in 12/08   Enlarged prostate    GERD (gastroesophageal reflux disease)    Hyperlipidemia    Hypertension    Ischemia    Pulmonary fibrosis, unspecified (HCC)    Shortness of breath    Skin disorder    Occurs at times as rash, whelps, blister-like areas  with itching sometimes-has occurred for years , but can not find an exact dx or cause   Past Surgical History:  Procedure Laterality Date   CARDIAC CATHETERIZATION N/A 02/06/2016   Procedure: Right/Left Heart Cath and Coronary Angiography;  Surgeon: Tonny Bollman, MD;  Location: [facility] INVASIVE CV LAB;  Service: Cardiovascular;  Laterality: N/A;   CATARACT EXTRACTION, BILATERAL Bilateral 2016   CORONARY ANGIOPLASTY WITH STENT PLACEMENT  06/2007   Est. EF of 55-60%PTCA and stenting of his left circumflex artery in 12/08   EYE SURGERY     INGUINAL HERNIA REPAIR Left    JOINT REPLACEMENT     KNEE ARTHROSCOPY  1990s   ORBITAL FRACTURE SURGERY Left 1957   TONSILLECTOMY     TOTAL KNEE ARTHROPLASTY Right 05/31/2014   Procedure: RIGHT TOTAL KNEE ARTHROPLASTY;  Surgeon: Loanne Drilling, MD;  Location: [facility];  Service: Orthopedics;  Laterality: Right;    Allergies  Allergies  Allergen Reactions   Chlorhexidine Gluconate Itching   Iodinated Contrast Media     Not really sure of the incident// patient denies allergy and has had catherterization without incident.     History of Present Illness    Patrick Knapp is a 89 y.o. male with PMH of CAD, CKD stage IIIb, HTN, hyperlipidemia, pulmonary fibrosis  and BPH presents today for follow-up of coronary artery disease.   Mr. Klimowicz was seen in  05/2007 Myoview performed for SOB revealing inferior lateral infarct>> heart cath and PCI to Lcx. In 2017 repeat stress Myoview for complaint of DOE and fatigue revealed apical defect low risk study.  Patient continued to have symptoms and underwent LHC on 01/2016 demonstrating patent stent in LCx 90% proximal LAD stenosis and 90% D1 stenosis. He underwent orbital atherectomy and Bio freedom DES research stent to the LAD. 2D echo completed on 12/2018 due to complaint of dyspnea on exertion. Completed and showed EF of 60-65%, no RWMA, mild/mod TV regurgitation.  Lexiscan Myoview completed as well on 12/2018 with no changes from previous study.  He was last seen by Dr. Elease Hashimoto 02/2021 and had discontinued Imdur with no further anginal complaints.  He is currently followed by pulmonology and was last seen by Dr. Vassie Loll on 08/28/2022 for follow-up with no changes noted. He was seen last 11/28/2021 for annual follow-up.  During visit patient was doing well and noted 1 isolated episode of left-sided chest pain.  Patient's blood pressures were stable EGD showed isolated PVCs.   Since last being seen in the office patient reports he has been doing well with no new cardiac complaints since previous visit.  124/68 heart rate 73 bpm.  During his current medications reactions.  He is staying active  and is doing home pulmonary rehab weights, lites, and monitoring his steps 7000/day.  He is currently not using oxygen.  Decreased due to he does drink boost shakes and supplements his meals. Patient denies chest pain, palpitations, dyspnea, PND, orthopnea, nausea, vomiting, dizziness, syncope, edema, weight gain, or early satiety.  Home Medications    Current Outpatient Medications  Medication Sig Dispense Refill   acetaminophen (TYLENOL) 650 MG CR tablet Take 650 mg by mouth every 8 (eight) hours as needed for pain.     ALPRAZolam  (XANAX) 0.5 MG tablet Take 0.5 mg by mouth 3 (three) times daily as needed. AS NEEDED FOR ANXIETY; taking at bedtime only for insomnia     amLODipine (NORVASC) 2.5 MG tablet Take 1 tablet (2.5 mg total) by mouth daily. 30 tablet 1   atorvastatin (LIPITOR) 20 MG tablet Take 20 mg by mouth daily.      cholecalciferol (VITAMIN D) 1000 units tablet Take 1,000 Units by mouth daily.     clopidogrel (PLAVIX) 75 MG tablet Take 1 tablet (75 mg total) by mouth daily. 30 tablet 11   gabapentin (NEURONTIN) 100 MG capsule Take 100 mg by mouth 3 (three) times daily.     Guaifenesin 1200 MG TB12 Take 1 tablet (1,200 mg total) by mouth 2 (two) times daily. 60 tablet 3   magnesium gluconate (MAGONATE) 500 MG tablet Take 500 mg by mouth daily.     nitroGLYCERIN (NITROSTAT) 0.4 MG SL tablet Place 1 tablet (0.4 mg total) under the tongue every 5 (five) minutes as needed for chest pain. 25 tablet 3   pantoprazole (PROTONIX) 40 MG tablet Take 40 mg by mouth daily.     Pirfenidone 801 MG TABS TAKE 1 TABLET BY MOUTH EVERY MORNING, AT NOON, AND AT BEDTIME. 90 tablet 6   tiZANidine (ZANAFLEX) 4 MG tablet Take 4 mg by mouth at bedtime.     No current facility-administered medications for this visit.     Review of Systems  Please see the history of present illness.    (+) Poor appetite (+) Shortness of breath  All other systems reviewed and are otherwise negative except as noted above.  Physical Exam    Wt Readings from Last 3 Encounters:  08/28/22 176 lb 12.8 oz (80.2 kg)  05/24/22 174 lb 12.8 oz (79.3 kg)  05/03/22 179 lb (81.2 kg)   AO:ZHYQM were no vitals filed for this visit.,There is no height or weight on file to calculate BMI.  Constitutional:      Appearance: Healthy appearance. Not in distress.  Neck:     Vascular: JVD normal.  Pulmonary:     Effort: Pulmonary effort is labored due to pulmonary fibrosis    Breath sounds: No wheezing. No rales. Diminished in the bases Cardiovascular:     Normal  rate. Regular rhythm. Normal S1. Normal S2.      Murmurs: There is no murmur.  Edema:    Peripheral edema absent.  Abdominal:     Palpations: Abdomen is soft non tender. There is no hepatomegaly.  Skin:    General: Skin is warm and dry.  Neurological:     General: No focal deficit present.     Mental Status: Alert and oriented to person, place and time.     Cranial Nerves: Cranial nerves are intact.  EKG/LABS/ Recent Cardiac Studies    ECG personally reviewed by me today -sinus rhythm with Trigemeny and  rate of 73 bpm left axis deviation no acute changes  consistent with previous EKG.    Lab Results  Component Value Date   CREATININE 1.45 11/21/2021   BUN 18 11/21/2021   NA 139 11/21/2021   K 4.1 11/21/2021   CL 105 11/21/2021   CO2 26 11/21/2021   Lab Results  Component Value Date   ALT 18 03/07/2022   AST 23 03/07/2022   ALKPHOS 76 03/07/2022   BILITOT 0.7 03/07/2022   Lab Results  Component Value Date   CHOL 123 02/06/2021   HDL 50 02/06/2021   LDLCALC 50 02/06/2021   TRIG 133 02/06/2021   CHOLHDL 2.5 02/06/2021    No results found for: "HGBA1C"   Assessment & Plan    1.  Coronary artery disease: -s/p PCI of left circumflex in 2008 and orbital arthrectomy of mid LAD in 2017 -Today patient reports he has been doing well with complaints since previous visit. -He is staying active and walking 7000 steps per day and using free weights and riding his bike. -EKG with trigeminy will check magnesium and BMET today -Continue Plavix 75 mg, Lipitor 20 mg daily, PRN Nitrostat  2.  Hypertension: -Patient's blood pressure today was controlled at 124/60 -Continue amlodipine 2.5 mg daily   3.  Hyperlipidemia: -Last LDL cholesterol on 04/2021 was 62 lipids checked by PCP this fall. -Continue Lipitor 20 mg daily  4.  Pulmonary fibrosis: -Idiopathic pulmonary fibrosis -Followed by pulmonology   5.Tricuspid regurgitation: -Follow-up clinically   Disposition:  Follow-up with Kristeen Miss, MD or APP in 12 months    Medication Adjustments/Labs and Tests Ordered: Current medicines are reviewed at length with the patient today.  Concerns regarding medicines are outlined above.   Signed, Napoleon Form, Leodis Rains, NP 02/07/2023, 10:50 AM [facility]

## 2023-02-08 ENCOUNTER — Encounter: Payer: Self-pay | Admitting: Nurse Practitioner

## 2023-02-08 ENCOUNTER — Ambulatory Visit: Payer: Medicare HMO | Admitting: Nurse Practitioner

## 2023-02-08 VITALS — BP 124/60 | HR 73 | Ht 67.5 in | Wt 166.2 lb

## 2023-02-08 DIAGNOSIS — I10 Essential (primary) hypertension: Secondary | ICD-10-CM | POA: Diagnosis not present

## 2023-02-08 DIAGNOSIS — E785 Hyperlipidemia, unspecified: Secondary | ICD-10-CM | POA: Diagnosis not present

## 2023-02-08 DIAGNOSIS — I25119 Atherosclerotic heart disease of native coronary artery with unspecified angina pectoris: Secondary | ICD-10-CM

## 2023-02-08 DIAGNOSIS — J84112 Idiopathic pulmonary fibrosis: Secondary | ICD-10-CM

## 2023-02-08 DIAGNOSIS — I361 Nonrheumatic tricuspid (valve) insufficiency: Secondary | ICD-10-CM | POA: Diagnosis not present

## 2023-02-08 NOTE — Patient Instructions (Signed)
Medication Instructions:  Your physician recommends that you continue on your current medications as directed. Please refer to the Current Medication list given to you today. *If you need a refill on your cardiac medications before your next appointment, please call your pharmacy*   Lab Work: TODAY-BMET, CBC, MAG If you have labs (blood work) drawn today and your tests are completely normal, you will receive your results only by: MyChart Message (if you have MyChart) OR A paper copy in the mail If you have any lab test that is abnormal or we need to change your treatment, we will call you to review the results.   Testing/Procedures: NONE ORDERED   Follow-Up: At [facility], you and your health needs are our priority.  As part of our continuing mission to provide you with exceptional heart care, we have created designated Provider Care Teams.  These Care Teams include your primary Cardiologist (physician) and Advanced Practice Providers (APPs -  Physician Assistants and Nurse Practitioners) who all work together to provide you with the care you need, when you need it.  We recommend signing up for the patient portal called "MyChart".  Sign up information is provided on this After Visit Summary.  MyChart is used to connect with patients for Virtual Visits (Telemedicine).  Patients are able to view lab/test results, encounter notes, upcoming appointments, etc.  Non-urgent messages can be sent to your provider as well.   To learn more about what you can do with MyChart, go to [email or web address].    Your next appointment:   12 month(s)  Provider:   Kristeen Miss, MD     Other Instructions

## 2023-02-28 ENCOUNTER — Other Ambulatory Visit: Payer: Self-pay | Admitting: Cardiovascular Disease

## 2023-04-05 ENCOUNTER — Telehealth: Payer: Self-pay | Admitting: Pulmonary Disease

## 2023-04-05 NOTE — Telephone Encounter (Signed)
This is a 89 y/o patient with history of cataracts and cataract excision surgery in past. This is unlikely to be due to Esbriet. He is overdue for f/u a[[t with Dr. Clementeen Graham, PharmD, MPH, BCPS, CPP Clinical Pharmacist (Rheumatology and Pulmonology)

## 2023-04-05 NOTE — Telephone Encounter (Signed)
Called back [place]. She expressed understanding and may fax more questions to [facility]. Will also call PT for FU appt

## 2023-04-05 NOTE — Telephone Encounter (Signed)
Patrick Knapp states patient having medication reaction to Esbriet. States can't see to well. Reference number is [id number]. [name] phone number is [phone].

## 2023-05-09 ENCOUNTER — Telehealth: Payer: Self-pay | Admitting: Pulmonary Disease

## 2023-05-09 NOTE — Telephone Encounter (Signed)
Patient would like order for oxygen. Patient would like to use [employer]. Patient scheduled 06/21/2023 with Dr. Vassie Loll. Patient phone number is [phone].

## 2023-05-09 NOTE — Telephone Encounter (Signed)
I spoke with the patient. He wants to be seen sooner then Dec. To qualify for oxygen.  I have scheduled him an appt with Beth on 11/14 at 9:30am. Nothing further needed.

## 2023-05-09 NOTE — Telephone Encounter (Signed)
[employer] called daughter about oxygen for father. [employer] needs orders for the oxygen.She was wondering when the order will be placed. Her father is short on breath and anxious due to the lack of oxygen. He's been experiencing this for a few weeks now.

## 2023-05-10 ENCOUNTER — Telehealth: Payer: Self-pay | Admitting: Pulmonary Disease

## 2023-05-10 NOTE — Telephone Encounter (Signed)
Patient's daughter presented up front with notes in regards to her father's need for oxygen. They will be faxed over to [facility]. If more information is needed please contact daughter at [phone].

## 2023-05-16 NOTE — Telephone Encounter (Signed)
Note from [place] on 05/09/2023:  I spoke with the patient. He wants to be seen sooner then Dec. To qualify for oxygen.   I have scheduled him an appt with Beth on 11/14 at 9:30am. Nothing further needed.

## 2023-05-21 ENCOUNTER — Encounter: Payer: Self-pay | Admitting: Pulmonary Disease

## 2023-05-21 ENCOUNTER — Ambulatory Visit: Payer: Medicare HMO | Admitting: Pulmonary Disease

## 2023-05-21 VITALS — BP 137/74 | HR 76 | Temp 98.3°F | Ht 68.0 in | Wt 162.0 lb

## 2023-05-21 DIAGNOSIS — J84112 Idiopathic pulmonary fibrosis: Secondary | ICD-10-CM

## 2023-05-21 DIAGNOSIS — R053 Chronic cough: Secondary | ICD-10-CM | POA: Diagnosis not present

## 2023-05-21 MED ORDER — BUDESONIDE 0.25 MG/2ML IN SUSP
0.2500 mg | Freq: Two times a day (BID) | RESPIRATORY_TRACT | 11 refills | Status: DC
Start: 2023-05-21 — End: 2023-06-03

## 2023-05-21 MED ORDER — SODIUM CHLORIDE 3 % IN NEBU: INHALATION_SOLUTION | Freq: Two times a day (BID) | RESPIRATORY_TRACT | 12 refills | Status: DC | PRN

## 2023-05-21 MED ORDER — PREDNISONE 20 MG PO TABS: 20.0000 mg | ORAL_TABLET | Freq: Every day | ORAL | 0 refills | Status: AC

## 2023-05-21 NOTE — Progress Notes (Signed)
@Patient  ID: Patrick Knapp, male    DOB: 10-22-1927, 89 y.o.   MRN: [id number]  No chief complaint on file.   Referring provider: Chilton Greathouse, MD  HPI:   89 y.o. man with a history of pulmonary fibrosis presented with acute on chronic shortness of breath and chest congestion, productive cough.  Multiple prior pulmonary notes reviewed.  Patient reports symptoms worse over the last 3 to 4 days.  Worsening cough.  More mucus production.  A little more short of breath.  Not any more hypoxemic per his report.  Chronically he coughs and produces white thick mucus.  He thinks is little bit worse over the last few days.  Associated with some mild increase in dyspnea.  We discussed at length possible bronchitis.  He denies sick contacts or significant fever chills etc.  We discussed at length high suspicion for worsening symptomatology related to his pulmonary fibrosis.  Discussed trial of anti-inflammatories to see if it will help symptoms acutely.  Expressed understanding.    Questionaires / Pulmonary Flowsheets:   ACT:      No data to display          MMRC:     No data to display          Epworth:      No data to display          Tests:   FENO:  No results found for: "NITRICOXIDE"  PFT:    Latest Ref Rng & Units 04/26/2017   11:02 AM 01/22/2017    9:45 AM  PFT Results  FVC-Pre L 2.51  2.47   FVC-Predicted Pre % 75  76   FVC-Post L  2.48   FVC-Predicted Post %  76   Pre FEV1/FVC % % 87  89   Post FEV1/FCV % %  85   FEV1-Pre L 2.18  2.19   FEV1-Predicted Pre % 95  98   FEV1-Post L  2.11   DLCO uncorrected ml/min/mmHg 16.54    DLCO UNC% % 57    DLCO corrected ml/min/mmHg 16.32    DLCO COR %Predicted % 56    DLVA Predicted % 89    TLC L  3.22   TLC % Predicted %  49   RV % Predicted %  26   Personally reviewed, spirometry shows a mild restriction versus air trapping.  DLCO moderately reduced.  WALK:     11/21/2021   11:42 AM 01/16/2021    10:43 AM 05/16/2020   10:58 AM 12/23/2019    4:46 PM 05/01/2019   10:57 AM 01/12/2019    2:35 PM 08/12/2017    7:42 AM  SIX MIN WALK  Supplimental Oxygen during Test? (L/min) No No No No No No   2 Minute Oxygen Saturation %       91 %  2 Minute HR       100  4 Minute Oxygen Saturation %       90 %  4 Minute HR       116  6 Minute Oxygen Saturation %       89 %  6 Minute HR       120  Tech Comments: Pt was able to complete 3 laps at mod pace with 1 rest break needed. Pt did c/o increased fatigue at end of walk Patient walked at a slow pace without stopping and denied any shortness of breath, No supplemental oxygen was used. Oxygen did not drop below  91% during walking. Pt was able to complete 3 laps at mod pace without any stops. Pt did not have any complaints at end of walk steady normal pace, no desat TA/CMA steady walk, no desat TA/CMA Patient was able to complete all 3 laps without stopping. He was able to walk at a moderate pace. O2 was not needed during walk. He did state at the end of the walk, that he felt more fatigued. Denied any chest or leg pain.     Imaging: Personally reviewed and as per EMR and discussion in this note No results found.  Lab Results: Personally reviewed CBC    Component Value Date/Time   WBC 9.8 02/08/2023 1059   WBC 8.5 11/21/2021 1128   RBC 5.06 02/08/2023 1059   RBC 5.03 11/21/2021 1128   HGB 15.2 02/08/2023 1059   HCT 44.7 02/08/2023 1059   PLT 175 02/08/2023 1059   MCV 88 02/08/2023 1059   MCH 30.0 02/08/2023 1059   MCH 28.2 02/07/2016 0256   MCHC 34.0 02/08/2023 1059   MCHC 33.2 11/21/2021 1128   RDW 13.0 02/08/2023 1059   LYMPHSABS 1.2 11/21/2021 1128   MONOABS 1.2 (H) 11/21/2021 1128   EOSABS 0.1 11/21/2021 1128   BASOSABS 0.1 11/21/2021 1128    BMET    Component Value Date/Time   NA 142 02/08/2023 1059   K 4.5 02/08/2023 1059   CL 104 02/08/2023 1059   CO2 24 02/08/2023 1059   GLUCOSE 94 02/08/2023 1059   GLUCOSE 99 11/21/2021 1128    BUN 16 02/08/2023 1059   CREATININE 1.32 (H) 02/08/2023 1059   CREATININE 1.21 (H) 03/26/2016 0747   CALCIUM 9.7 02/08/2023 1059   GFRNONAA 44 (L) 02/08/2020 1042   GFRAA 51 (L) 02/08/2020 1042    BNP No results found for: "BNP"  ProBNP No results found for: "PROBNP"  Specialty Problems       Pulmonary Problems   IPF (idiopathic pulmonary fibrosis) (HCC)    Allergies  Allergen Reactions   Chlorhexidine Gluconate Itching   Iodinated Contrast Media     Not really sure of the incident// patient denies allergy and has had catherterization without incident.    Immunization History  Administered Date(s) Administered   Influenza Split 03/09/2016   Influenza Whole 04/22/2017   Influenza, High Dose Seasonal PF 06/08/2009, 04/08/2016, 04/11/2017, 03/24/2018, 03/10/2019   Influenza-Unspecified 04/08/2005, 06/09/2007, 04/08/2008, 04/08/2010, 07/10/2022   PFIZER(Purple Top)SARS-COV-2 Vaccination 08/15/2019, 09/09/2019, 05/15/2020   Pneumococcal Conjugate-13 04/08/2016   Pneumococcal-Unspecified 07/09/2004   Rsv, Bivalent, Protein Subunit Rsvpref,pf Verdis Frederickson) 06/25/2022   Tdap 07/09/2014    Past Medical History:  Diagnosis Date   Arthritis    "all the joints" (02/06/2016)   Coronary artery disease    a. PCI with DES to mid LAD 7/17 b.post PTCA and stenting of his left circumflex artery in 12/08   Enlarged prostate    GERD (gastroesophageal reflux disease)    Hyperlipidemia    Hypertension    Ischemia    Pulmonary fibrosis, unspecified (HCC)    Shortness of breath    Skin disorder    Occurs at times as rash, whelps, blister-like areas  with itching sometimes-has occurred for years , but can not find an exact dx or cause    Tobacco History: Social History   Tobacco Use  Smoking Status Never  Smokeless Tobacco Never   Counseling given: Not Answered   Continue to not smoke  Outpatient Encounter Medications as of 05/21/2023  Medication Sig  acetaminophen  (TYLENOL) 650 MG CR tablet Take 650 mg by mouth every 8 (eight) hours as needed for pain.   ALPRAZolam (XANAX) 0.5 MG tablet Take 0.5 mg by mouth 3 (three) times daily as needed. AS NEEDED FOR ANXIETY; taking at bedtime only for insomnia   amLODipine (NORVASC) 2.5 MG tablet TAKE 1 TABLET EVERY DAY (MUST KEEP UPCOMING APPOINTMENT IN AUGUST 2024 FOR ANYMORE REFILLS)   atorvastatin (LIPITOR) 20 MG tablet Take 20 mg by mouth daily.    cholecalciferol (VITAMIN D) 1000 units tablet Take 1,000 Units by mouth daily.   clopidogrel (PLAVIX) 75 MG tablet Take 1 tablet (75 mg total) by mouth daily.   gabapentin (NEURONTIN) 100 MG capsule Take 100 mg by mouth 3 (three) times daily.   Guaifenesin 1200 MG TB12 Take 1 tablet (1,200 mg total) by mouth 2 (two) times daily.   magnesium gluconate (MAGONATE) 500 MG tablet Take 500 mg by mouth daily.   nitroGLYCERIN (NITROSTAT) 0.4 MG SL tablet Place 1 tablet (0.4 mg total) under the tongue every 5 (five) minutes as needed for chest pain.   pantoprazole (PROTONIX) 40 MG tablet Take 40 mg by mouth daily.   Pirfenidone 801 MG TABS TAKE 1 TABLET BY MOUTH EVERY MORNING, AT NOON, AND AT BEDTIME.   tiZANidine (ZANAFLEX) 4 MG tablet Take 4 mg by mouth at bedtime.   No facility-administered encounter medications on file as of 05/21/2023.     Review of Systems  Review of Systems  No chest pain with exertion.  Orthopnea or PND.  Comprehensive review of systems otherwise negative. Physical Exam  There were no vitals taken for this visit.  Wt Readings from Last 5 Encounters:  02/08/23 166 lb 3.2 oz (75.4 kg)  08/28/22 176 lb 12.8 oz (80.2 kg)  05/24/22 174 lb 12.8 oz (79.3 kg)  05/03/22 179 lb (81.2 kg)  03/15/22 176 lb (79.8 kg)    BMI Readings from Last 5 Encounters:  02/08/23 25.65 kg/m  08/28/22 27.28 kg/m  05/24/22 26.58 kg/m  05/03/22 27.22 kg/m  03/15/22 26.76 kg/m     Physical Exam General: Sitting in chair, no acute distress Eyes: EOMI, no  icterus Neck: Supple, no JVP Pulmonary: Crackles in the bases, rhonchorous on the right, otherwise clear Cardiovascular: Warm, no edema Abdomen: Nondistended, bowel sounds present MSK: No synovitis, no joint effusion Neuro: Normal gait, no weakness Psych: Normal mood, full affect  Assessment & Plan:   Chest congestion/mucus production: Acute worsening per her report.  Maybe a bit worse over the last 5 days or so but overall worsening over time.  Unclear how acute this changes.  Suspect related to underlying IPF.  Chest exam is clear.  Possible emergence of asthma.  Seems less likely.  Trial of prednisone.  Inhaled budesonide via nebulizer for long-term anti-inflammatory treatment to see if we can decrease mucus production.  Saline nebulizer to see if we can thin out mucus over time and make it more easy to expectorate.  Oxygen use: Purchased [employer] device.  He has no history of desaturation.  Can use as needed.  Encouraged to discuss with primary pulmonologist at upcoming appointment.   No follow-ups on file.   Karren Burly, MD 05/21/2023

## 2023-05-21 NOTE — Patient Instructions (Signed)
Take prednisone 20 mg a day for 5 days and stop  See if this helps decrease the mucus  I sent budesonide (inhaled steroid) and saline nebulized medicines to a mail pharmacy that we use a lot for nebulized medicines.  They should deliver the medicine to you.  It is called [facility]  We will send a message, new order for nebulizer machine to [facility].  They should deliver the machine to your house.  Use oxygen as needed with overflow it helps you feel better  Feel free to discuss with Dr. Vassie Loll at next visit

## 2023-05-22 ENCOUNTER — Telehealth (HOSPITAL_BASED_OUTPATIENT_CLINIC_OR_DEPARTMENT_OTHER): Payer: Self-pay

## 2023-05-22 NOTE — Telephone Encounter (Signed)
Note from [facility] nash asking for order for oxygen be sent to [facility] with [employer]. Phone [phone] fax [phone]. Dx code Pulmonary Fibrosis. There has never been desat in office and a walk test was not done at last visit. They have purchased a machine from [employer]. We are unable to order oxygen as we have no notes to support order.

## 2023-05-22 NOTE — Telephone Encounter (Signed)
Per Dr Vassie Loll we will do a walk at the appt on 11/18.

## 2023-05-23 ENCOUNTER — Ambulatory Visit: Payer: Medicare HMO | Admitting: Primary Care

## 2023-05-24 DIAGNOSIS — E785 Hyperlipidemia, unspecified: Secondary | ICD-10-CM | POA: Diagnosis not present

## 2023-05-24 DIAGNOSIS — I25119 Atherosclerotic heart disease of native coronary artery with unspecified angina pectoris: Secondary | ICD-10-CM | POA: Diagnosis not present

## 2023-05-24 DIAGNOSIS — N183 Chronic kidney disease, stage 3 unspecified: Secondary | ICD-10-CM | POA: Diagnosis not present

## 2023-05-24 DIAGNOSIS — M8580 Other specified disorders of bone density and structure, unspecified site: Secondary | ICD-10-CM | POA: Diagnosis not present

## 2023-05-24 DIAGNOSIS — N400 Enlarged prostate without lower urinary tract symptoms: Secondary | ICD-10-CM | POA: Diagnosis not present

## 2023-05-24 DIAGNOSIS — E291 Testicular hypofunction: Secondary | ICD-10-CM | POA: Diagnosis not present

## 2023-05-27 ENCOUNTER — Ambulatory Visit (HOSPITAL_BASED_OUTPATIENT_CLINIC_OR_DEPARTMENT_OTHER): Payer: Medicare HMO | Admitting: Pulmonary Disease

## 2023-05-27 ENCOUNTER — Encounter (HOSPITAL_BASED_OUTPATIENT_CLINIC_OR_DEPARTMENT_OTHER): Payer: Self-pay | Admitting: Pulmonary Disease

## 2023-05-27 VITALS — BP 118/68 | HR 86 | Ht 68.0 in | Wt 164.0 lb

## 2023-05-27 DIAGNOSIS — J84112 Idiopathic pulmonary fibrosis: Secondary | ICD-10-CM

## 2023-05-27 DIAGNOSIS — J9611 Chronic respiratory failure with hypoxia: Secondary | ICD-10-CM | POA: Diagnosis not present

## 2023-05-27 DIAGNOSIS — R053 Chronic cough: Secondary | ICD-10-CM | POA: Diagnosis not present

## 2023-05-27 MED ORDER — SODIUM CHLORIDE 3 % IN NEBU: INHALATION_SOLUTION | RESPIRATORY_TRACT | 12 refills | Status: DC | PRN

## 2023-05-27 MED ORDER — SODIUM CHLORIDE 0.9 % IN NEBU
3.0000 mL | INHALATION_SOLUTION | RESPIRATORY_TRACT | 12 refills | Status: DC | PRN
Start: 2023-05-27 — End: 2023-05-27

## 2023-05-27 NOTE — Assessment & Plan Note (Addendum)
  He is tolerating Esbriet well he has had minimal progression but clearly progressive disease indicating that he has IPF He will continue on Esbriet.  Check LFTs every 3 months  For his sputum production, we will provide him with a flutter valve, nebulizer machine and hypertonic saline nebs

## 2023-05-27 NOTE — Addendum Note (Signed)
Addended by: Winn Jock on: 05/27/2023 04:43 PM   Modules accepted: Orders

## 2023-05-27 NOTE — Assessment & Plan Note (Signed)
He does desaturate on walking 2 laps but recovers with resting.  I explained to him that there is no longevity benefit to using oxygen in the setting.  Since he has already purchased the oxygen asked him to use 2 L POC leading this distance and he can use it for oxygen continuous during sleep

## 2023-05-27 NOTE — Progress Notes (Signed)
   Subjective:    Patient ID: NIKI GAMEROS, male    DOB: [birth year], 89 y.o.   MRN: [id number]  HPI  89 yo never smoker For follow-up of  Idiopathic pulmonary fibrosis.   He started on OFEV 08/2017 for slight drop in lung function/FVC but this was stopped due to diarrhea in spite of decreasing the dose.  He resumed Esbriet 01/2019 and has tolerated this well.   PMH - CKD creat 1.6  87-month follow-up visit. He saw my partner last month because he was having cough productive of white mucus.  He was started on budesonide nebs but never received the medication on the nebulizer.  He arrives with his and wife today and they have several questions. He has purchased an [employer] POC at home concentrator and wants to know when to use this.  He has never had an ambulatory test. May perform 1 in the past and he was able to ambulate 2 laps with desaturation to 88% and recovered after 2 minutes of resting  He underwent a home rehab program and since then he has been walking 6000 steps per day. He is compliant with this..  He states that he had LFTs done with his PCP and is due for a follow-up visit this week.   Significant tests/ events reviewed    HRCT 12/2020 definite UIP, minimal progression HRCT 08/2018 "probable" UIP, no honeycombing, unchanged from 2018   CT chest 2011 showed diffuse right greater than left peripheral and subpleural reticular fibrosis. Ambulatory O2 saturation on room air without desaturation 12/28/2016 ANA , CCP neg   HRCT 12/2016 fibrotic interstitial lung disease with mild honeycombing. Slight basilar predominance and slight asymmetrical involvement of the right lung with some progression since 2011.   PFTs 01/2017 no obs, FEV1 96%, FVC 76% DLCO was noted 57% in 04/2017.   Spirometry 06/2017  slight drop in FVC 68%  11/2017 FVC 65% 05/2018 FVC 49 %  Review of Systems  neg for any significant sore throat, dysphagia, itching, sneezing, nasal congestion or excess/  purulent secretions, fever, chills, sweats, unintended wt loss, pleuritic or exertional cp, hempoptysis, orthopnea pnd or change in chronic leg swelling. Also denies presyncope, palpitations, heartburn, abdominal pain, nausea, vomiting, diarrhea or change in bowel or urinary habits, dysuria,hematuria, rash, arthralgias, visual complaints, headache, numbness weakness or ataxia.      Objective:   Physical Exam  Gen. Pleasant, well-nourished, in no distress ENT - no thrush, no pallor/icterus,no post nasal drip Neck: No JVD, no thyromegaly, no carotid bruits Lungs: no use of accessory muscles, no dullness to percussion, bibasal dry crackles Cardiovascular: Rhythm regular, heart sounds  normal, no murmurs or gallops, no peripheral edema Musculoskeletal: No deformities, no cyanosis or clubbing        Assessment & Plan:

## 2023-05-27 NOTE — Patient Instructions (Addendum)
X Ambulatory sat  X Rx for saline neb Nebuliser x 1 to DME Flutter valve   Check with Dr Felipa Eth about liver function tests

## 2023-05-28 NOTE — Telephone Encounter (Signed)
Patient's insurance requires order to be sent to [facility], heres message from [employer] regarding order  Patrick Knapp; [name]; [name] Patient does not qualify for a POC since he isn't on continuous O2 setting with [place]. We would need to qualify him for continuous. So we would need order updated to continuous with portability and walk test results/sat.  Thank you

## 2023-05-29 NOTE — Telephone Encounter (Signed)
Patrick Knapp; Patrick Knapp; Patrick Knapp; Patrick Knapp, Patrick Knapp reached out to patient and they don't want the oxygen they have already paid OOP for concentrator but would like the neb and flutter valve so all we are need now for this patient is a new order for nebulizer stating new nebulizer order with supplies listed on order....thanks

## 2023-05-29 NOTE — Telephone Encounter (Signed)
Orders for nebulizer and flutter valve sent to DME. 05/27/23.

## 2023-05-30 ENCOUNTER — Telehealth: Payer: Self-pay | Admitting: Pulmonary Disease

## 2023-05-30 DIAGNOSIS — J449 Chronic obstructive pulmonary disease, unspecified: Secondary | ICD-10-CM | POA: Diagnosis not present

## 2023-05-30 NOTE — Telephone Encounter (Signed)
Pharm calling regarding a note they got from the PT's  daughter that says the following:   When he saw Dr. Vassie Loll he was told not get the Budesonide and only get the Sodium Chloride.   [facility] needs a call for it to be documented at the pharmacy if this is correct. He has seen several providers it looks like.    [phone] for any pharmacist who can note it in the system for this PT.

## 2023-05-30 NOTE — Telephone Encounter (Signed)
PT calling to see if there is another Pulmonary Fibrosis support group and who the leader in [place]. Please call to advise @ [phone]

## 2023-05-30 NOTE — Telephone Encounter (Signed)
MR Patrick Knapp suppor grup leader had a transplant and there is no support group. There is one patient at [facility] and I Can connect  them both via email

## 2023-05-31 DIAGNOSIS — N1831 Chronic kidney disease, stage 3a: Secondary | ICD-10-CM | POA: Diagnosis not present

## 2023-05-31 DIAGNOSIS — Z792 Long term (current) use of antibiotics: Secondary | ICD-10-CM | POA: Diagnosis not present

## 2023-05-31 DIAGNOSIS — Z1331 Encounter for screening for depression: Secondary | ICD-10-CM | POA: Diagnosis not present

## 2023-05-31 DIAGNOSIS — I25119 Atherosclerotic heart disease of native coronary artery with unspecified angina pectoris: Secondary | ICD-10-CM | POA: Diagnosis not present

## 2023-05-31 DIAGNOSIS — K635 Polyp of colon: Secondary | ICD-10-CM | POA: Diagnosis not present

## 2023-05-31 DIAGNOSIS — E669 Obesity, unspecified: Secondary | ICD-10-CM | POA: Diagnosis not present

## 2023-05-31 DIAGNOSIS — Z1339 Encounter for screening examination for other mental health and behavioral disorders: Secondary | ICD-10-CM | POA: Diagnosis not present

## 2023-05-31 DIAGNOSIS — K219 Gastro-esophageal reflux disease without esophagitis: Secondary | ICD-10-CM | POA: Diagnosis not present

## 2023-05-31 DIAGNOSIS — R82998 Other abnormal findings in urine: Secondary | ICD-10-CM | POA: Diagnosis not present

## 2023-05-31 DIAGNOSIS — J8410 Pulmonary fibrosis, unspecified: Secondary | ICD-10-CM | POA: Diagnosis not present

## 2023-05-31 DIAGNOSIS — Z0000 Encounter for general adult medical examination without abnormal findings: Secondary | ICD-10-CM | POA: Diagnosis not present

## 2023-05-31 DIAGNOSIS — M8580 Other specified disorders of bone density and structure, unspecified site: Secondary | ICD-10-CM | POA: Diagnosis not present

## 2023-05-31 DIAGNOSIS — E785 Hyperlipidemia, unspecified: Secondary | ICD-10-CM | POA: Diagnosis not present

## 2023-05-31 DIAGNOSIS — J9611 Chronic respiratory failure with hypoxia: Secondary | ICD-10-CM | POA: Diagnosis not present

## 2023-05-31 NOTE — Telephone Encounter (Signed)
Called PT to advise of Dr. Charlean Sanfilippo info and he had me call wife to let her know. I adv email from Radiation protection practitioner s/b forthcoming. Thank you Dr's. NFN

## 2023-06-03 MED ORDER — BUDESONIDE 0.25 MG/2ML IN SUSP: 0.2500 mg | Freq: Two times a day (BID) | RESPIRATORY_TRACT | 11 refills | Status: AC

## 2023-06-03 MED ORDER — SODIUM CHLORIDE 3 % IN NEBU: INHALATION_SOLUTION | Freq: Two times a day (BID) | RESPIRATORY_TRACT | 12 refills | Status: DC | PRN

## 2023-06-03 NOTE — Telephone Encounter (Signed)
Daughter calling about this issue.   [facility] is his mail order Pharmacy that we need to send it to.  Fax to [phone]  Dr. Rexene Edison said he ordered Dr. Vassie Loll was to have ordered   She has been calling for two weeks about this. Any questions call [phone]

## 2023-06-03 NOTE — Telephone Encounter (Signed)
Rxs sent to [facility] pharmacy.

## 2023-06-12 ENCOUNTER — Other Ambulatory Visit: Payer: Self-pay

## 2023-06-12 ENCOUNTER — Other Ambulatory Visit (HOSPITAL_BASED_OUTPATIENT_CLINIC_OR_DEPARTMENT_OTHER): Payer: Self-pay

## 2023-06-12 ENCOUNTER — Telehealth: Payer: Self-pay | Admitting: Pulmonary Disease

## 2023-06-12 MED ORDER — SODIUM CHLORIDE 3 % IN NEBU: INHALATION_SOLUTION | Freq: Two times a day (BID) | RESPIRATORY_TRACT | 12 refills | Status: DC | PRN

## 2023-06-12 MED ORDER — SODIUM CHLORIDE 3 % IN NEBU: INHALATION_SOLUTION | Freq: Two times a day (BID) | RESPIRATORY_TRACT | 11 refills | Status: DC | PRN

## 2023-06-12 NOTE — Telephone Encounter (Signed)
Pam states needs frequency for Sodium Chloride. Pam phone number is [phone].

## 2023-06-12 NOTE — Telephone Encounter (Signed)
Already sent clarification

## 2023-06-12 NOTE — Telephone Encounter (Signed)
Ok refill sodium chloride neb sol x 12.

## 2023-06-14 NOTE — Telephone Encounter (Signed)
Patient states got message for medication. Does not know the medication. Would like to get a call back. Patient phone number is [phone].

## 2023-06-14 NOTE — Telephone Encounter (Signed)
Spoke with patient. He has received the saline for nebulizer but states that he was told that another medication was called in. I am not sure what he is talking about as he is not sure what the medication name was. He is going to call them and call us back if needed. Advised that the saline was the last thing we sent in.

## 2023-06-21 ENCOUNTER — Ambulatory Visit (HOSPITAL_BASED_OUTPATIENT_CLINIC_OR_DEPARTMENT_OTHER): Payer: Medicare HMO | Admitting: Pulmonary Disease

## 2023-06-26 ENCOUNTER — Other Ambulatory Visit (HOSPITAL_BASED_OUTPATIENT_CLINIC_OR_DEPARTMENT_OTHER): Payer: Self-pay

## 2023-06-26 MED ORDER — SODIUM CHLORIDE 3 % IN NEBU: INHALATION_SOLUTION | Freq: Two times a day (BID) | RESPIRATORY_TRACT | 11 refills | Status: DC | PRN

## 2023-07-01 ENCOUNTER — Telehealth: Payer: Self-pay | Admitting: Pulmonary Disease

## 2023-07-01 NOTE — Telephone Encounter (Signed)
[facility] wanted to know if they needed to discontinue the Budesonide per the patients request. Please call and advise.

## 2023-07-01 NOTE — Telephone Encounter (Signed)
D/C with pharmacy.

## 2023-08-30 DIAGNOSIS — H01002 Unspecified blepharitis right lower eyelid: Secondary | ICD-10-CM | POA: Diagnosis not present

## 2023-08-30 DIAGNOSIS — H5711 Ocular pain, right eye: Secondary | ICD-10-CM | POA: Diagnosis not present

## 2023-09-18 ENCOUNTER — Telehealth: Payer: Self-pay | Admitting: Pulmonary Disease

## 2023-09-18 NOTE — Telephone Encounter (Signed)
 Esprit refill request has been sent by Greggory Keen. PT states he has no refills but needs this medication badly. His # is [phone]

## 2023-09-18 NOTE — Telephone Encounter (Signed)
 Form filled outand put on Dr Carlena Sax desk to be signed.

## 2023-09-19 NOTE — Telephone Encounter (Signed)
 faxed

## 2023-10-01 DIAGNOSIS — H6123 Impacted cerumen, bilateral: Secondary | ICD-10-CM | POA: Diagnosis not present

## 2023-10-03 NOTE — Telephone Encounter (Signed)
 NFN

## 2023-10-30 DIAGNOSIS — E785 Hyperlipidemia, unspecified: Secondary | ICD-10-CM | POA: Diagnosis not present

## 2023-10-30 DIAGNOSIS — F5104 Psychophysiologic insomnia: Secondary | ICD-10-CM | POA: Diagnosis not present

## 2023-10-30 DIAGNOSIS — I25119 Atherosclerotic heart disease of native coronary artery with unspecified angina pectoris: Secondary | ICD-10-CM | POA: Diagnosis not present

## 2023-10-30 DIAGNOSIS — F419 Anxiety disorder, unspecified: Secondary | ICD-10-CM | POA: Diagnosis not present

## 2023-10-30 DIAGNOSIS — R682 Dry mouth, unspecified: Secondary | ICD-10-CM | POA: Diagnosis not present

## 2023-10-30 DIAGNOSIS — J8410 Pulmonary fibrosis, unspecified: Secondary | ICD-10-CM | POA: Diagnosis not present

## 2023-10-30 DIAGNOSIS — R454 Irritability and anger: Secondary | ICD-10-CM | POA: Diagnosis not present

## 2023-11-19 DIAGNOSIS — F5104 Psychophysiologic insomnia: Secondary | ICD-10-CM | POA: Diagnosis not present

## 2023-11-19 DIAGNOSIS — E785 Hyperlipidemia, unspecified: Secondary | ICD-10-CM | POA: Diagnosis not present

## 2023-11-19 DIAGNOSIS — J9611 Chronic respiratory failure with hypoxia: Secondary | ICD-10-CM | POA: Diagnosis not present

## 2023-11-19 DIAGNOSIS — J8410 Pulmonary fibrosis, unspecified: Secondary | ICD-10-CM | POA: Diagnosis not present

## 2023-11-19 DIAGNOSIS — R682 Dry mouth, unspecified: Secondary | ICD-10-CM | POA: Diagnosis not present

## 2023-11-19 DIAGNOSIS — I25119 Atherosclerotic heart disease of native coronary artery with unspecified angina pectoris: Secondary | ICD-10-CM | POA: Diagnosis not present

## 2023-11-19 DIAGNOSIS — Z792 Long term (current) use of antibiotics: Secondary | ICD-10-CM | POA: Diagnosis not present

## 2023-11-19 DIAGNOSIS — N1831 Chronic kidney disease, stage 3a: Secondary | ICD-10-CM | POA: Diagnosis not present

## 2023-11-19 DIAGNOSIS — F419 Anxiety disorder, unspecified: Secondary | ICD-10-CM | POA: Diagnosis not present

## 2023-12-18 ENCOUNTER — Other Ambulatory Visit: Payer: Self-pay | Admitting: Cardiovascular Disease

## 2024-02-18 ENCOUNTER — Ambulatory Visit: Payer: Self-pay | Admitting: Pulmonary Disease

## 2024-02-18 NOTE — Telephone Encounter (Signed)
 FYI Only or Action Required?: Action required by provider: update on patient condition.  Patient is followed in Pulmonology for IPF, last seen on 05/27/2023 by Patrick Harden GAILS, MD.  Called Nurse Triage reporting Shortness of Breath.  Symptoms began about a month ago.  Interventions attempted: OTC medications: Guaifenesin , Nebulizer treatments, and Home oxygen use.  Symptoms are: unchanged.  Triage Disposition: See HCP Within 4 Hours (Or PCP Triage)  Patient/caregiver understands and will follow disposition?: No, wishes to speak with PCP      Copied from CRM #[id number]. Topic: Clinical - Red Word Triage >> Feb 18, 2024  2:04 PM [name] wrote: Red Word that prompted transfer to Nurse Triage: Shortness of breath Reason for Disposition  [1] Longstanding difficulty breathing AND [2] not responding to usual therapy  Answer Assessment - Initial Assessment Questions E2C2 Pulmonary Triage - Initial Assessment Questions Chief Complaint (e.g., cough, sob, wheezing, fever, chills, sweat or additional symptoms) *Go to specific symptom protocol after initial questions. Pt daughter calling to request a bronchodilator to help with thinning out secretions Reporting mucus plugs, increased SOB  How long have symptoms been present? A few months  Have you tested for COVID or Flu? Note: If not, ask patient if a home test can be taken. If so, instruct patient to call back for positive results. No  MEDICINES:   Have you used any OTC meds to help with symptoms? Yes If yes, ask What medications? Guaifenesin   Have you used your inhalers/maintenance medication? Yes If yes, What medications? saline neb - as prescribed, without relief  If inhaler, ask How many puffs and how often? Note: Review instructions on medication in the chart. N/a  OXYGEN: Do you wear supplemental oxygen? Yes If yes, How many liters are you supposed to use? Nocturnal O2  Do you monitor your oxygen  levels? Yes, irregularly If yes, What is your reading (oxygen level) today? 87 with exertion, recovers with rest to 96-7  What is your usual oxygen saturation reading?  (Note: Pulmonary O2 sats should be 90% or greater) Mid 90s      1. RESPIRATORY STATUS: Describe your breathing? (e.g., wheezing, shortness of breath, unable to speak, severe coughing)      See above 2. ONSET: When did this breathing problem begin?      See above 3. PATTERN Does the difficult breathing come and go, or has it been constant since it started?      intermittent 4. SEVERITY: How bad is your breathing? (e.g., mild, moderate, severe)      Mild to mod Daughter reports increase SOB above baseline 5. RECURRENT SYMPTOM: Have you had difficulty breathing before? If Yes, ask: When was the last time? and What happened that time?      N/a 6. CARDIAC HISTORY: Do you have any history of heart disease? (e.g., heart attack, angina, bypass surgery, angioplasty)      denies 7. LUNG HISTORY: Do you have any history of lung disease?  (e.g., pulmonary embolus, asthma, emphysema)     IPF 8. CAUSE: What do you think is causing the breathing problem?      IPF 9. OTHER SYMPTOMS: Do you have any other symptoms? (e.g., chest pain, cough, dizziness, fever, runny nose)     denies 10. O2 SATURATION MONITOR:  Do you use an oxygen saturation monitor (pulse oximeter) at home? If Yes, ask: What is your reading (oxygen level) today? What is your usual oxygen saturation reading? (e.g., 95%)  N/a 11. PREGNANCY: Is there any chance you are pregnant? When was your last menstrual period?       N/a 12. TRAVEL: Have you traveled out of the country in the last month? (e.g., travel history, exposures)       N/a    Economist offered to schedule with APP, but caregiver declined, only wanting primary pulmonologist for continuity of care.  Caregiver requesting bronchodilator/guidance on helping with  thinning secretions. Triager will forward encounter for Dr. Jude 's office to review and advise. Caregiver verbalized understanding and is expecting call back from office for next steps. Preferred number: [phone]  Protocols used: Breathing Difficulty-A-AH

## 2024-02-18 NOTE — Telephone Encounter (Signed)
 FYI pt has appt with you tomorrow at 2pm

## 2024-02-18 NOTE — Telephone Encounter (Signed)
 His daughter is a nurse she has been keeping a check on him. She thinks a lot of this maybe ending stages but also wants to make sure he dosen't have an infection. She will not be present for visit but her brother will be with him and he will video chat her in so she can talk with you

## 2024-02-18 NOTE — Telephone Encounter (Signed)
 FYI

## 2024-02-19 ENCOUNTER — Encounter (HOSPITAL_BASED_OUTPATIENT_CLINIC_OR_DEPARTMENT_OTHER): Payer: Self-pay

## 2024-02-19 ENCOUNTER — Ambulatory Visit (HOSPITAL_BASED_OUTPATIENT_CLINIC_OR_DEPARTMENT_OTHER)

## 2024-02-19 VITALS — BP 128/73 | HR 82 | Wt 137.0 lb

## 2024-02-19 DIAGNOSIS — R5383 Other fatigue: Secondary | ICD-10-CM | POA: Diagnosis not present

## 2024-02-19 DIAGNOSIS — J84112 Idiopathic pulmonary fibrosis: Secondary | ICD-10-CM | POA: Diagnosis not present

## 2024-02-19 DIAGNOSIS — J9611 Chronic respiratory failure with hypoxia: Secondary | ICD-10-CM

## 2024-02-19 NOTE — Progress Notes (Signed)
 @Patient  ID: Patrick Knapp, male    DOB: 09-16-1927, 89 y.o.   MRN: [id number]  Chief Complaint  Patient presents with   Acute Visit    Referring provider: Avva, Ravisankar, MD  HPI: Patrick Knapp is a 89 y/o male with PMH of IPF, chronic respiratory failure with hypoxia, CAD, GERD, and HTN who presents today for complaints of fatigue and shortness of breath.  He states that he usually ambulates about 7000 steps per day but cut that back to about 4000 steps per day over the last couple months.  He reports that he had done a Google search that indicated that he should not be doing that many steps per day and then cut his steps back at that time.  Also around that time he reports that he stopped doing his flutter valve and has only been doing his saline nebs once a day.  This is also in the setting of weight loss; please note that his weight is 137 pounds today and his last visit in November he was 164 pounds.  He has had difficulty with keeping up his appetite despite increasing his protein intake with milkshakes.  He reports significant fatigue as well.  He denies chest pain, night sweats, fever, chills.  He states that his cough is less productive but it is still present.  TEST/EVENTS :   Allergies  Allergen Reactions   Chlorhexidine  Gluconate Itching   Iodinated Contrast Media     Not really sure of the incident// patient denies allergy and has had catherterization without incident.    Immunization History  Administered Date(s) Administered    sv, Bivalent, Protein Subunit Rsvpref,pf [place]) 06/25/2022   Influenza Split 03/09/2016   Influenza Whole 04/22/2017   Influenza, High Dose Seasonal PF 06/08/2009, 04/08/2016, 04/11/2017, 03/24/2018, 03/10/2019   Influenza-Unspecified 04/08/2005, 06/09/2007, 04/08/2008, 04/08/2010, 07/10/2022   PFIZER(Purple Top)SARS-COV-2 Vaccination 08/15/2019, 09/09/2019, 05/15/2020   Pneumococcal Conjugate-13 04/08/2016    Pneumococcal-Unspecified 07/09/2004   Tdap 07/09/2014    Past Medical History:  Diagnosis Date   Arthritis    all the joints (02/06/2016)   Coronary artery disease    a. PCI with DES to mid LAD 7/17 b.post PTCA and stenting of his left circumflex artery in 12/08   Enlarged prostate    GERD (gastroesophageal reflux disease)    Hyperlipidemia    Hypertension    Ischemia    Pulmonary fibrosis, unspecified (HCC)    Shortness of breath    Skin disorder    Occurs at times as rash, whelps, blister-like areas  with itching sometimes-has occurred for years , but can not find an exact dx or cause    Tobacco History: Social History   Tobacco Use  Smoking Status Never  Smokeless Tobacco Never   Counseling given: Not Answered   Outpatient Medications Prior to Visit  Medication Sig Dispense Refill   acetaminophen  (TYLENOL ) 650 MG CR tablet Take 650 mg by mouth every 8 (eight) hours as needed for pain.     ALPRAZolam  (XANAX ) 0.5 MG tablet Take 0.5 mg by mouth 3 (three) times daily as needed. AS NEEDED FOR ANXIETY; taking at bedtime only for insomnia     amLODipine  (NORVASC ) 2.5 MG tablet TAKE 1 TABLET EVERY DAY (MUST KEEP UPCOMING APPOINTMENT IN AUGUST 2024 FOR ANYMORE REFILLS) 90 tablet 0   budesonide  (PULMICORT ) 0.25 MG/2ML nebulizer solution Take 2 mLs (0.25 mg total) by nebulization 2 (two) times daily. J84.112 60 mL 11   cholecalciferol  (VITAMIN D ) 1000  units tablet Take 1,000 Units by mouth daily.     clopidogrel  (PLAVIX ) 75 MG tablet Take 1 tablet (75 mg total) by mouth daily. 30 tablet 11   Guaifenesin  1200 MG TB12 Take 1 tablet (1,200 mg total) by mouth 2 (two) times daily. 60 tablet 3   magnesium  gluconate (MAGONATE) 500 MG tablet Take 500 mg by mouth daily.     mirtazapine (REMERON) 7.5 MG tablet Take 7.5 mg by mouth at bedtime.     nitroGLYCERIN  (NITROSTAT ) 0.4 MG SL tablet Place 1 tablet (0.4 mg total) under the tongue every 5 (five) minutes as needed for chest pain. 25  tablet 3   pantoprazole  (PROTONIX ) 40 MG tablet Take 40 mg by mouth daily.     Pirfenidone  801 MG TABS TAKE 1 TABLET BY MOUTH EVERY MORNING, AT NOON, AND AT BEDTIME. 90 tablet 6   sodium chloride  HYPERTONIC 3 % nebulizer solution Take by nebulization 2 (two) times daily as needed for other. J84.112. suply 4ml vials please 224 mL 11   atorvastatin  (LIPITOR) 20 MG tablet Take 20 mg by mouth daily.  (Patient not taking: Reported on 02/19/2024)     No facility-administered medications prior to visit.     Review of Systems: See HPI for positives.  Constitutional:   No   night sweats,  Fevers, chills,  or  lassitude.  Positive for fatigue, weight loss, dyspnea, decreased appetite.  HEENT:   No headaches,  Difficulty swallowing,  Tooth/dental problems, or  Sore throat,                No sneezing, itching, ear ache, nasal congestion, post nasal drip,   CV:  No chest pain,  Orthopnea, PND, swelling in lower extremities, anasarca, dizziness, palpitations, syncope.   GI  No heartburn, indigestion, abdominal pain, nausea, vomiting, diarrhea, change in bowel habits, loss of appetite, bloody stools.   Resp: No shortness of breath with exertion or at rest.  No excess mucus, no productive cough,  No non-productive cough,  No coughing up of blood.  No change in color of mucus.  No wheezing.  No chest wall deformity  Skin: no rash or lesions.  GU: no dysuria, change in color of urine, no urgency or frequency.  No flank pain, no hematuria   MS:  No joint pain or swelling.  No decreased range of motion.  No back pain.    Physical Exam  BP 128/73 (BP Location: Left Arm, Patient Position: Sitting)   Pulse 82   Wt 137 lb (62.1 kg)   SpO2 96%   BMI 20.83 kg/m   GEN: A/Ox3; pleasant , NAD, well nourished    HEENT:  [place]/AT,  EACs-clear, TMs-wnl, NOSE-clear, THROAT-clear, no lesions, no postnasal drip or exudate noted.   NECK:  Supple w/ fair ROM; no JVD; normal carotid impulses w/o bruits; no  thyromegaly or nodules palpated; no lymphadenopathy.    RESP  Clear  P & A; w/o, wheezes/ rales/ or rhonchi. no accessory muscle use, no dullness to percussion  CARD:  RRR, no m/r/g, no peripheral edema, pulses intact, no cyanosis or clubbing.  GI:   Soft & nt; nml bowel sounds; no organomegaly or masses detected.   Musco: Warm bil, no deformities or joint swelling noted.   Neuro: alert, no focal deficits noted.    Skin: Warm, no lesions or rashes    Lab Results:  CBC    Component Value Date/Time   WBC 9.8 02/08/2023 1059   WBC 8.5  11/21/2021 1128   RBC 5.06 02/08/2023 1059   RBC 5.03 11/21/2021 1128   HGB 15.2 02/08/2023 1059   HCT 44.7 02/08/2023 1059   PLT 175 02/08/2023 1059   MCV 88 02/08/2023 1059   MCH 30.0 02/08/2023 1059   MCH 28.2 02/07/2016 0256   MCHC 34.0 02/08/2023 1059   MCHC 33.2 11/21/2021 1128   RDW 13.0 02/08/2023 1059   LYMPHSABS 1.2 11/21/2021 1128   MONOABS 1.2 (H) 11/21/2021 1128   EOSABS 0.1 11/21/2021 1128   BASOSABS 0.1 11/21/2021 1128    BMET    Component Value Date/Time   NA 142 02/08/2023 1059   K 4.5 02/08/2023 1059   CL 104 02/08/2023 1059   CO2 24 02/08/2023 1059   GLUCOSE 94 02/08/2023 1059   GLUCOSE 99 11/21/2021 1128   BUN 16 02/08/2023 1059   CREATININE 1.32 (H) 02/08/2023 1059   CREATININE 1.21 (H) 03/26/2016 0747   CALCIUM  9.7 02/08/2023 1059   GFRNONAA 44 (L) 02/08/2020 1042   GFRAA 51 (L) 02/08/2020 1042    BNP No results found for: BNP  ProBNP No results found for: PROBNP  Imaging: No results found.  Administration History     None          Latest Ref Rng & Units 04/26/2017   11:02 AM 01/22/2017    9:45 AM  PFT Results  FVC-Pre L 2.51  2.47   FVC-Predicted Pre % 75  76   FVC-Post L  2.48   FVC-Predicted Post %  76   Pre FEV1/FVC % % 87  89   Post FEV1/FCV % %  85   FEV1-Pre L 2.18  2.19   FEV1-Predicted Pre % 95  98   FEV1-Post L  2.11   DLCO uncorrected ml/min/mmHg 16.54    DLCO UNC% %  57    DLCO corrected ml/min/mmHg 16.32    DLCO COR %Predicted % 56    DLVA Predicted % 89    TLC L  3.22   TLC % Predicted %  49   RV % Predicted %  26     No results found for: NITRICOXIDE   Assessment & Plan:   Assessment & Plan IPF (idiopathic pulmonary fibrosis) (HCC) - Resume pulmonary toilet with 3% saline nebs followed by flutter valve twice a day -Continue incentive spirometry -Continue Esbriet  as ordered; consider increasing dosing - Check follow up liver function testing; add CBC to evaluate for anemia -Check follow-up CT to evaluate progression of IPF especially in the setting of weight loss, fatigue, appetite changes. Chronic respiratory failure with hypoxia (HCC) - Continue oxygen at night. Fatigue, unspecified type    Return in about 4 weeks (around 03/18/2024) for CT results.  [name], PA-C 02/19/2024

## 2024-02-19 NOTE — Assessment & Plan Note (Signed)
-   Resume pulmonary toilet with 3% saline nebs followed by flutter valve twice a day -Continue incentive spirometry -Continue Esbriet  as ordered; consider increasing dosing - Check follow up liver function testing; add CBC to evaluate for anemia -Check follow-up CT to evaluate progression of IPF especially in the setting of weight loss, fatigue, appetite changes.

## 2024-02-19 NOTE — Assessment & Plan Note (Signed)
 Continue oxygen at night.

## 2024-02-19 NOTE — Patient Instructions (Addendum)
 Continue pulmonary toilet as discussed:  3% saline nebs twice a day followed by flutter valve  Keep nebulizer and oxygen equipment clean and dry.  Continue incentive spirometry as you have been doing.  Activity as tolerated is encouraged.  Chest CT to evaluate progression of IPF ordered; follow up in clinic with Dr. Jude in 4 weeks to review results  Continue Esbriet  as ordered.  Call or return to clinic if new or worsening symptoms.

## 2024-02-21 ENCOUNTER — Telehealth (HOSPITAL_BASED_OUTPATIENT_CLINIC_OR_DEPARTMENT_OTHER): Payer: Self-pay

## 2024-02-21 NOTE — Telephone Encounter (Signed)
 Called pts daughter to notify that pt should come in for labs before next OV

## 2024-02-24 ENCOUNTER — Other Ambulatory Visit (HOSPITAL_BASED_OUTPATIENT_CLINIC_OR_DEPARTMENT_OTHER): Payer: Self-pay

## 2024-02-24 ENCOUNTER — Telehealth: Payer: Self-pay

## 2024-02-24 ENCOUNTER — Other Ambulatory Visit (INDEPENDENT_AMBULATORY_CARE_PROVIDER_SITE_OTHER)

## 2024-02-24 ENCOUNTER — Telehealth (HOSPITAL_BASED_OUTPATIENT_CLINIC_OR_DEPARTMENT_OTHER): Payer: Self-pay

## 2024-02-24 DIAGNOSIS — R5383 Other fatigue: Secondary | ICD-10-CM

## 2024-02-24 NOTE — Telephone Encounter (Signed)
 Copied from CRM [id number]. Topic: Clinical - Medication Question >> Feb 24, 2024  2:36 PM Devaughn RAMAN wrote: Reason for CRM: Patient's daughter Rock called regarding medication for the patient. Rock stated the patient was supposed to get albuterol for his nebulizer but it has not been received and she would like a call back at [phone].

## 2024-02-24 NOTE — Telephone Encounter (Signed)
**Note De-identified  [place] Obfuscation** Please advise 

## 2024-02-24 NOTE — Addendum Note (Signed)
 Addended by: Errol Ala on: 02/24/2024 04:05 PM   Modules accepted: Orders

## 2024-02-24 NOTE — Telephone Encounter (Signed)
 Pt daughter notified

## 2024-02-24 NOTE — Telephone Encounter (Signed)
 Created by accident.

## 2024-02-24 NOTE — Telephone Encounter (Signed)
 Patient's daughter came to [place] location to drop off FMLA paperwork that needs to be signed, I let her know to go to [facility] since patient see's Alva and Candis. Patient daughter also let us  know that the patient does not want to have CT done if not necessary.

## 2024-02-25 ENCOUNTER — Ambulatory Visit: Payer: Self-pay

## 2024-02-25 LAB — COMPREHENSIVE METABOLIC PANEL WITH GFR
ALT: 11 U/L (ref 0–53)
AST: 20 U/L (ref 0–37)
Albumin: 4 g/dL (ref 3.5–5.2)
Alkaline Phosphatase: 77 U/L (ref 39–117)
BUN: 21 mg/dL (ref 6–23)
CO2: 30 meq/L (ref 19–32)
Calcium: 9.4 mg/dL (ref 8.4–10.5)
Chloride: 103 meq/L (ref 96–112)
Creatinine, Ser: 1.14 mg/dL (ref 0.40–1.50)
GFR: 55.38 mL/min — ABNORMAL LOW (ref >60.00)
Glucose, Bld: 98 mg/dL (ref 70–99)
Potassium: 4.6 meq/L (ref 3.5–5.1)
Sodium: 138 meq/L (ref 135–145)
Total Bilirubin: 0.4 mg/dL (ref 0.2–1.2)
Total Protein: 7.2 g/dL (ref 6.0–8.3)

## 2024-02-25 LAB — CBC WITH DIFFERENTIAL/PLATELET
Basophils Absolute: 0.1 K/uL (ref 0.0–0.1)
Basophils Relative: 1.2 % (ref 0.0–3.0)
Eosinophils Absolute: 0.2 K/uL (ref 0.0–0.7)
Eosinophils Relative: 1.8 % (ref 0.0–5.0)
HCT: 44 % (ref 39.0–52.0)
Hemoglobin: 14.5 g/dL (ref 13.0–17.0)
Lymphocytes Relative: 15.6 % (ref 12.0–46.0)
Lymphs Abs: 1.3 K/uL (ref 0.7–4.0)
MCHC: 33.1 g/dL (ref 30.0–36.0)
MCV: 91 fl (ref 78.0–100.0)
Monocytes Absolute: 0.8 K/uL (ref 0.1–1.0)
Monocytes Relative: 9.6 % (ref 3.0–12.0)
Neutro Abs: 6.1 K/uL (ref 1.4–7.7)
Neutrophils Relative %: 71.8 % (ref 43.0–77.0)
Platelets: 215 K/uL (ref 150.0–400.0)
RBC: 4.83 Mil/uL (ref 4.22–5.81)
RDW: 13.6 % (ref 11.5–15.5)
WBC: 8.5 K/uL (ref 4.0–10.5)

## 2024-03-02 ENCOUNTER — Other Ambulatory Visit: Payer: Self-pay | Admitting: Nurse Practitioner

## 2024-03-02 MED ORDER — AMLODIPINE BESYLATE 2.5 MG PO TABS: 2.5000 mg | ORAL_TABLET | Freq: Every day | ORAL | 0 refills | Status: AC

## 2024-03-04 ENCOUNTER — Telehealth: Payer: Self-pay

## 2024-03-04 NOTE — Telephone Encounter (Signed)
 Copied from CRM [id number]. Topic: Clinical - Medical Advice >> Mar 04, 2024  8:38 AM Patrick Knapp wrote: Reason for CRM:   Daughter Patrick Knapp is contacting clinic due to feeling pt's worsening symptoms. Last saw [name], and has CT scan scheduled on 09/9. Pt has history of end stage pulmonary fibrosis.  Pt normally uses oxygen PRN and uses it rarely during the day. But is now using oxygen more often. Is having more shortness of breath at rest and having mild noticeable labored breathing which is new. Pt is very anxious and hyper focused because of the progression.  Patrick Knapp does not feel that he needs to be seen as soon as possible, but feels it would settle her father's mind. She would like more information on how his disease will progress and how to respond to the changes in his disease. She would like to see if he could possibly be seen earlier than his scheduled 10/13 appt, possibly towards the end of Sept due to these changes, so they can get a baseline of where he is at. She will be out of town from 9/13-9/22 due to her daughter having a baby but is open to getting him scheduled towards the end of 03/2024. Would appreciate a call back to discuss more.   CB#  [phone]  Spoke / patient daughter sent CmR to nurse that works w/ [facility] to advise

## 2024-03-04 NOTE — Telephone Encounter (Signed)
 Copied from CRM [id number]. Topic: Clinical - Medical Advice >> Mar 04, 2024  8:38 AM Russell PARAS wrote: Reason for CRM:   Daughter Rock is contacting clinic due to feeling pt's worsening symptoms. Last saw [name], and has CT scan scheduled on 09/9. Pt has history of end stage pulmonary fibrosis.  Pt normally uses oxygen PRN and uses it rarely during the day. But is now using oxygen more often. Is having more shortness of breath at rest and having mild noticeable labored breathing which is new. Pt is very anxious and hyper focused because of the progression.  Rock does not feel that he needs to be seen as soon as possible, but feels it would settle her father's mind. She would like more information on how his disease will progress and how to respond to the changes in his disease. She would like to see if he could possibly be seen earlier than his scheduled 10/13 appt, possibly towards the end of Sept due to these changes, so they can get a baseline of where he is at. She will be out of town from 9/13-9/22 due to her daughter having a baby but is open to getting him scheduled towards the end of 03/2024. Would appreciate a call back to discuss more.   CB#  [phone]

## 2024-03-05 ENCOUNTER — Ambulatory Visit (HOSPITAL_BASED_OUTPATIENT_CLINIC_OR_DEPARTMENT_OTHER): Payer: Self-pay

## 2024-03-10 ENCOUNTER — Telehealth (HOSPITAL_BASED_OUTPATIENT_CLINIC_OR_DEPARTMENT_OTHER): Payer: Self-pay

## 2024-03-10 NOTE — Telephone Encounter (Signed)
 Copied from CRM [id number]. Topic: Clinical - Medical Advice >> Mar 10, 2024  9:56 AM Corean SAUNDERS wrote: Reason for CRM: Patients daughter Rock Hover would like to speak with Dr. Roxan nurse regarding hospice care as patient is becoming more shot of breath.  Rock states she is hesitant to have another CT completed for the patient considering his age.   Please call back to advise at  Rock Hover (daughter) [phone]

## 2024-03-17 ENCOUNTER — Ambulatory Visit (HOSPITAL_BASED_OUTPATIENT_CLINIC_OR_DEPARTMENT_OTHER)

## 2024-03-17 DIAGNOSIS — J84112 Idiopathic pulmonary fibrosis: Secondary | ICD-10-CM | POA: Diagnosis not present

## 2024-03-17 DIAGNOSIS — I129 Hypertensive chronic kidney disease with stage 1 through stage 4 chronic kidney disease, or unspecified chronic kidney disease: Secondary | ICD-10-CM | POA: Diagnosis not present

## 2024-03-17 DIAGNOSIS — K219 Gastro-esophageal reflux disease without esophagitis: Secondary | ICD-10-CM | POA: Diagnosis not present

## 2024-03-17 DIAGNOSIS — M10031 Idiopathic gout, right wrist: Secondary | ICD-10-CM | POA: Diagnosis not present

## 2024-03-17 DIAGNOSIS — N1831 Chronic kidney disease, stage 3a: Secondary | ICD-10-CM | POA: Diagnosis not present

## 2024-03-17 DIAGNOSIS — I270 Primary pulmonary hypertension: Secondary | ICD-10-CM | POA: Diagnosis not present

## 2024-03-17 DIAGNOSIS — E785 Hyperlipidemia, unspecified: Secondary | ICD-10-CM | POA: Diagnosis not present

## 2024-03-17 DIAGNOSIS — I25119 Atherosclerotic heart disease of native coronary artery with unspecified angina pectoris: Secondary | ICD-10-CM | POA: Diagnosis not present

## 2024-03-17 NOTE — Telephone Encounter (Signed)
 Please see other telephone note. NFN

## 2024-03-19 ENCOUNTER — Ambulatory Visit: Admitting: Primary Care

## 2024-03-30 ENCOUNTER — Telehealth (HOSPITAL_BASED_OUTPATIENT_CLINIC_OR_DEPARTMENT_OTHER): Payer: Self-pay

## 2024-03-30 MED ORDER — ACETYLCYSTEINE 10 % IN SOLN: RESPIRATORY_TRACT | 1 refills | Status: DC

## 2024-03-30 NOTE — Telephone Encounter (Signed)
 Rx sent and pt notified.

## 2024-03-30 NOTE — Telephone Encounter (Signed)
 Copied from CRM [id number]. Topic: Clinical - Medication Question >> Mar 30, 2024 12:31 PM [name] wrote: Reason for CRM: Would Dr Jude consider order Mucomyst  nebulizer for patient?  Please call Rock Hover, daughter @ [phone].

## 2024-04-03 ENCOUNTER — Telehealth: Payer: Self-pay

## 2024-04-03 NOTE — Telephone Encounter (Signed)
 Copied from CRM #[id number]. Topic: Clinical - Medication Question >> Apr 03, 2024 10:17 AM Leila C wrote: Reason for CRM: Patient [phone] states has IPF and getting Pirfenidone  801 mg, free for a year and a half and the shipment is late, patient was suppose to get the shipment 03/30/24 and will not get it 04/08/24 or 04/09/24. Patient is asking if he can miss a few days of Pirfenidone , because he will be out of medication? Patient ask if there's samples or can this be sent to a pharmacy to cover the missing days? Please advise and call back.   [id number] [address], [place] - [address] [place] [address] Phone:[phone]:[phone]

## 2024-04-07 NOTE — Telephone Encounter (Signed)
 Patient can have interruption in treatment of pirfenidone  for up to 14 days before manufacturer recommends re-titrating. Please advise patient to resume pirfenidone  as prescribed (does NOT need to re-titrate)  Sherry Pennant, PharmD, MPH, BCPS, CPP Clinical Pharmacist [facility])

## 2024-04-07 NOTE — Telephone Encounter (Signed)
 Called pt and there was no answer-LMTCB

## 2024-04-08 ENCOUNTER — Telehealth: Payer: Self-pay | Admitting: Pulmonary Disease

## 2024-04-08 MED ORDER — ACETYLCYSTEINE 10 % IN SOLN: RESPIRATORY_TRACT | 1 refills | Status: DC

## 2024-04-08 NOTE — Telephone Encounter (Signed)
 Patient was prescribed Acetylcysteine  but there are no instructions on how to use it. He is currently in hospice and his daughter has the medication here in office with her.

## 2024-04-08 NOTE — Telephone Encounter (Signed)
 PT's daughter provided her contact information. Dr.Alva has been contacted and asked to provide more instructions on how to use the medication and at what dose. Once this has been clarified PT will be contacted with the new information. PT's daughter was made aware of company policies procedures and HIPAA regulations. She stated she understands of how the communication will proceed.

## 2024-04-08 NOTE — Telephone Encounter (Signed)
 Per Dr. Jude via epic secure chat- 4ml neb to use daily x 1 week then prn .  Rx has been updated.

## 2024-04-08 NOTE — Addendum Note (Signed)
 Addended by: CLAUDENE NEVINS A on: 04/08/2024 09:55 AM   Modules accepted: Orders

## 2024-04-09 ENCOUNTER — Other Ambulatory Visit: Payer: Self-pay | Admitting: *Deleted

## 2024-04-09 ENCOUNTER — Telehealth (HOSPITAL_BASED_OUTPATIENT_CLINIC_OR_DEPARTMENT_OTHER): Payer: Self-pay

## 2024-04-09 NOTE — Telephone Encounter (Signed)
 I called and spoke wit patient's daughter Rock Hover [facility]) regarding another message, while on the phone, she provided additional information regarding her father's condition and made a request.  She states her dad is on Hospice care at home and is having problems getting the mucous up and is requesting for him to be suctioned. I let her know I would get a message to Dr. Jude and once we hear back from him we will call her back with his recommendations. She verbalized understanding.   Dr. Jude, Patient's daughter states he is struggling with the mucous and she is requesting the he be suctioned by Hospice.  Please advise.  Thank you.

## 2024-04-09 NOTE — Telephone Encounter (Signed)
 Copied from CRM [id number]. Topic: Clinical - Medical Advice >> Apr 09, 2024  9:39 AM Corean SAUNDERS wrote: Reason for CRM:  Patrick Knapp ( daughter) [phone] is requesting a call back from Dr. Roxan nurse as the patient who is in end stage pulmonary fibrosis is requesting to some how have his excess mucus suctioned as he can no longer cough it up. Patrick states the hospice nurse can do this but will require an order from Dr. Avla and if not is requesting advise about how to get this completed for the patient as soon as possible.

## 2024-04-09 NOTE — Telephone Encounter (Signed)
 Called and spoke with daughter, Rock Hover [facility]), made her aware of information per pharmacist.  She states her dad is on Hospice care at home and is having problems getting the mucous up and is requesting for him to be suctioned.  I let her know I would get a message to Dr. Jude and once we hear back from him we will call her back with his recommendations.  She verbalized understanding.

## 2024-04-13 ENCOUNTER — Other Ambulatory Visit: Payer: Self-pay

## 2024-04-13 ENCOUNTER — Other Ambulatory Visit: Payer: Self-pay | Admitting: *Deleted

## 2024-04-13 ENCOUNTER — Other Ambulatory Visit: Payer: Self-pay | Admitting: Pulmonary Disease

## 2024-04-13 DIAGNOSIS — J84112 Idiopathic pulmonary fibrosis: Secondary | ICD-10-CM

## 2024-04-13 DIAGNOSIS — R053 Chronic cough: Secondary | ICD-10-CM

## 2024-04-13 NOTE — Telephone Encounter (Signed)
 I called and spoke with patient's daughter, Rock (HAWAII), I let her know that Dr. Jude said it was find to order suction for him for his oral secretions.  While we were on the phone she said she had been giving her dad the acetylcysteine  10% nebulizer solution twice daily all along not realizing it said daily for a week and then twice daily.  The respiratory therapist told her that in the hospital they actually give it 3 times daily, so she is requesting to give it 3 times per day.  They are unable to get it refilled at this time because it it too early based on he was given the doses twice daily to start with instead of daily.  She is also requesting that the doses be single doses, she is getting a vial and has to use a syringe to pull it up and measure it out.  She is the only one that can give it because of how it is packaged and she is being charged extra for the syringes.  Tammy, Please advise.  Thank you.

## 2024-04-13 NOTE — Progress Notes (Unsigned)
Called and spoke.

## 2024-04-13 NOTE — Telephone Encounter (Signed)
 Copied from CRM #[id number]. Topic: Clinical - Medication Question >> Apr 13, 2024 10:11 AM Ismael A wrote: Reason for CRM: patient's daughter Rock, calling requesting for Dr. Jude to revise nebulizer order to read acetylbysteine 10% at 4MLs BID - please order single viles for administration - daughter Rock PEAK has been administering it BID which is helping him  Ph# [phone]

## 2024-04-14 ENCOUNTER — Other Ambulatory Visit: Payer: Self-pay

## 2024-04-14 MED ORDER — ACETYLCYSTEINE 10 % IN SOLN: 4.0000 mL | Freq: Two times a day (BID) | RESPIRATORY_TRACT | 1 refills | Status: DC

## 2024-04-16 NOTE — Telephone Encounter (Signed)
 Dr. Jude  can you claritfy what the Rx is suppose to be for this patient for Mucomyst .

## 2024-04-17 NOTE — Telephone Encounter (Signed)
 Copied from CRM [id number]. Topic: Clinical - Medication Question >> Apr 13, 2024  4:21 PM Patrick Knapp wrote: Reason for CRM:Pts daughter Rock called and stated hospice would like to know where the order for suction treatment was sent . Hospice does not have the order  Awaiting for Dr. Cyndi response regarding which rx should be sent.

## 2024-04-20 ENCOUNTER — Ambulatory Visit: Admitting: Primary Care

## 2024-04-21 ENCOUNTER — Telehealth: Payer: Self-pay | Admitting: Cardiovascular Disease

## 2024-04-21 NOTE — Telephone Encounter (Signed)
 Noted. Will route to Jackee Alberts, NP as RICK as he last saw patient 1 year ago.   Jackee - just an BURUNDI.  Armandina Iman S Melitta Tigue, NP

## 2024-04-21 NOTE — Telephone Encounter (Signed)
 FYI Pt's wife cx appt said pt is in hospice now

## 2024-04-23 ENCOUNTER — Telehealth (HOSPITAL_BASED_OUTPATIENT_CLINIC_OR_DEPARTMENT_OTHER): Payer: Self-pay

## 2024-04-23 NOTE — Telephone Encounter (Signed)
 She dosen't want to change to bigger bottle she wants to add a 30ml bottle to what he already is receiving

## 2024-04-23 NOTE — Telephone Encounter (Signed)
 Rx for 30ml bottle and 3CC syringes sent to pharmacy         Copied from CRM [id number]. Topic: Clinical - Medication Question >> Apr 23, 2024  9:07 AM Isabell A wrote: Reason for CRM: Patients daughter Rock is requesting acetylcysteine  (MUCOMYST ) 10 % nebulizer solution [[id number]] - requesting 30 mL bottle in addition to the original prescription sent - asking no to cancel the original order.  Callback number: [phone]   [id number] [address] [place], [place] - [address], [place] [address] Phone: [phone]  Fax: [phone]

## 2024-04-24 ENCOUNTER — Other Ambulatory Visit (HOSPITAL_BASED_OUTPATIENT_CLINIC_OR_DEPARTMENT_OTHER): Payer: Self-pay

## 2024-04-24 MED ORDER — LUER LOCK SAFETY SYRINGES 3 ML MISC: 3.0000 mL | Freq: Once | 3 refills | Status: AC

## 2024-04-24 MED ORDER — ACETYLCYSTEINE 10 % IN SOLN: 4.0000 mL | Freq: Two times a day (BID) | RESPIRATORY_TRACT | 1 refills | Status: DC

## 2024-04-27 ENCOUNTER — Other Ambulatory Visit: Payer: Self-pay | Admitting: *Deleted

## 2024-04-27 DIAGNOSIS — J9611 Chronic respiratory failure with hypoxia: Secondary | ICD-10-CM

## 2024-04-27 DIAGNOSIS — J84112 Idiopathic pulmonary fibrosis: Secondary | ICD-10-CM

## 2024-04-27 DIAGNOSIS — R053 Chronic cough: Secondary | ICD-10-CM

## 2024-04-27 NOTE — Telephone Encounter (Signed)
 Order placed again for suction and supplies for Hospice.  Placed urgently.

## 2024-05-04 ENCOUNTER — Telehealth: Payer: Self-pay | Admitting: *Deleted

## 2024-05-04 NOTE — Telephone Encounter (Signed)
 Called Patrick Knapp to provide an update regarding the nebulizer mouthpiece. Rosina from [facility] will be contacting the patient with details on when it will be sent out. Later, Rosina called back and confirmed she spoke with Patrick Knapp; the order for the nebulizer mouthpiece has been updated and will be sent out as soon as possible. Nothing further needed at this time.

## 2024-05-04 NOTE — Telephone Encounter (Signed)
 Copied from CRM [id number]. Topic: Clinical - Medication Question >> May 04, 2024 10:07 AM Nathanel DEL wrote: Reason for CRM: Rock Hover, pt's daughter called to say the oral nebulizer was not delivered.  Rock would appreciate a call back.  She said she has been speaking with Carren about this order, and would like Sierrah to give her a call back.  [phone] Rock was not there when everything was delivered, and pt did not know to ask.

## 2024-05-05 ENCOUNTER — Ambulatory Visit (HOSPITAL_BASED_OUTPATIENT_CLINIC_OR_DEPARTMENT_OTHER): Admitting: Cardiovascular Disease

## 2024-05-05 NOTE — Telephone Encounter (Signed)
 Order placed previously for suctioning equipment.  Nothing further needed.

## 2024-05-18 ENCOUNTER — Other Ambulatory Visit: Payer: Self-pay | Admitting: Pulmonary Disease

## 2024-05-18 ENCOUNTER — Telehealth (HOSPITAL_BASED_OUTPATIENT_CLINIC_OR_DEPARTMENT_OTHER): Payer: Self-pay | Admitting: Pulmonary Disease

## 2024-05-18 NOTE — Telephone Encounter (Unsigned)
 Copied from CRM [id number]. Topic: Clinical - Medication Refill >> May 18, 2024  4:21 PM Isabell A wrote: Medication: acetylcysteine  (MUCOMYST ) 10 % nebulizer solution [[id number]]   Individual biles    Has the patient contacted their pharmacy? Yes (Agent: If no, request that the patient contact the pharmacy for the refill. If patient does not wish to contact the pharmacy document the reason why and proceed with request.) (Agent: If yes, when and what did the pharmacy advise?)  This is the patient's preferred pharmacy:  [id number] [address], [place] - [address] [place] [address] Phone: [phone] Fax: [phone] Is this the correct pharmacy for this prescription? Yes If no, delete pharmacy and type the correct one.   Has the prescription been filled recently? Yes  Is the patient out of the medication? No  Has the patient been seen for an appointment in the last year OR does the patient have an upcoming appointment? Yes  Can we respond through MyChart? No  Agent: Please be advised that Rx refills may take up to 3 business days. We ask that you follow-up with your pharmacy.

## 2024-06-03 ENCOUNTER — Other Ambulatory Visit (HOSPITAL_BASED_OUTPATIENT_CLINIC_OR_DEPARTMENT_OTHER): Payer: Self-pay | Admitting: Pulmonary Disease

## 2024-06-03 MED ORDER — SODIUM CHLORIDE 3 % IN NEBU: INHALATION_SOLUTION | Freq: Two times a day (BID) | RESPIRATORY_TRACT | 11 refills | Status: AC | PRN

## 2024-06-03 NOTE — Telephone Encounter (Signed)
 Copied from CRM [id number]. Topic: Clinical - Medication Refill >> Jun 03, 2024 11:30 AM Rozanna G wrote: Medication: sodium chloride  HYPERTONIC 3 % nebulizer solution  Has the patient contacted their pharmacy? Yes (Agent: If no, request that the patient contact the pharmacy for the refill. If patient does not wish to contact the pharmacy document the reason why and proceed with request.) (Agent: If yes, when and what did the pharmacy advise?)  This is the patient's preferred pharmacy:  [id number] [address], [place] - [address] [place] [address] Phone: [phone] Fax: [phone]   Is this the correct pharmacy for this prescription? Yes If no, delete pharmacy and type the correct one.   Has the prescription been filled recently? Yes  Is the patient out of the medication? Yes  Has the patient been seen for an appointment in the last year OR does the patient have an upcoming appointment? Yes  Can we respond through MyChart? No  Agent: Please be advised that Rx refills may take up to 3 business days. We ask that you follow-up with your pharmacy.

## 2024-07-09 DEATH — deceased
